# Patient Record
Sex: Female | Born: 1985 | Race: Black or African American | Hispanic: No | Marital: Married | Smoking: Never smoker
Health system: Southern US, Community
[De-identification: ages and names within clinical notes are randomized; demographics above are authoritative.]

## PROBLEM LIST (undated history)

## (undated) ENCOUNTER — Inpatient Hospital Stay (HOSPITAL_COMMUNITY): Payer: Self-pay

## (undated) DIAGNOSIS — F329 Major depressive disorder, single episode, unspecified: Secondary | ICD-10-CM

## (undated) DIAGNOSIS — K219 Gastro-esophageal reflux disease without esophagitis: Secondary | ICD-10-CM

## (undated) DIAGNOSIS — E785 Hyperlipidemia, unspecified: Secondary | ICD-10-CM

## (undated) DIAGNOSIS — F419 Anxiety disorder, unspecified: Secondary | ICD-10-CM

## (undated) DIAGNOSIS — M1990 Unspecified osteoarthritis, unspecified site: Secondary | ICD-10-CM

## (undated) DIAGNOSIS — F32A Depression, unspecified: Secondary | ICD-10-CM

## (undated) DIAGNOSIS — T7840XA Allergy, unspecified, initial encounter: Secondary | ICD-10-CM

## (undated) DIAGNOSIS — R519 Headache, unspecified: Secondary | ICD-10-CM

## (undated) DIAGNOSIS — F840 Autistic disorder: Secondary | ICD-10-CM

## (undated) HISTORY — PX: PERIPHERALLY INSERTED CENTRAL CATHETER INSERTION: SHX2221

## (undated) HISTORY — DX: Allergy, unspecified, initial encounter: T78.40XA

## (undated) HISTORY — DX: Unspecified osteoarthritis, unspecified site: M19.90

## (undated) HISTORY — DX: Hyperlipidemia, unspecified: E78.5

## (undated) HISTORY — DX: Anxiety disorder, unspecified: F41.9

## (undated) HISTORY — DX: Major depressive disorder, single episode, unspecified: F32.9

## (undated) HISTORY — DX: Depression, unspecified: F32.A

## (undated) HISTORY — DX: Headache, unspecified: R51.9

## (undated) HISTORY — PX: WISDOM TOOTH EXTRACTION: SHX21

## (undated) HISTORY — DX: Autistic disorder: F84.0

---

## 2004-08-18 ENCOUNTER — Emergency Department (HOSPITAL_COMMUNITY): Admission: EM | Admit: 2004-08-18 | Discharge: 2004-08-18 | Payer: Self-pay | Admitting: *Deleted

## 2005-08-06 ENCOUNTER — Emergency Department (HOSPITAL_COMMUNITY): Admission: EM | Admit: 2005-08-06 | Discharge: 2005-08-07 | Payer: Self-pay | Admitting: Emergency Medicine

## 2010-12-14 NOTE — L&D Delivery Note (Signed)
Delivery Note Upon arrival to L&D, pt pushed about 5 minutes with good effort. At 6:52 AM a healthy female was delivered via Vaginal, Spontaneous Delivery (Presentation: ; Occiput Anterior).  APGAR: 8, 9; weight 5 lb 10.5 oz (2565 g).   Placenta status: Intact, Spontaneous Pathology.  Cord: 3 vessels with the following complications: Cord was extremely short, had to be cut at perineum.  Moderate meconium stained fluid, but baby with vigorous cry--so NICU excused. Anesthesia: None  Episiotomy: None Lacerations: None Suture Repair: N/A Est. Blood Loss (mL): 200  Mom to postpartum.  Baby to nursery-stable.  Oliver Pila 10/08/2011, 7:29 AM

## 2011-03-05 ENCOUNTER — Inpatient Hospital Stay (HOSPITAL_COMMUNITY)
Admission: AD | Admit: 2011-03-05 | Discharge: 2011-03-05 | Disposition: A | Discharge status: Home / Self Care | Payer: BC Managed Care – PPO | Source: Ambulatory Visit | Attending: Obstetrics and Gynecology | Admitting: Obstetrics and Gynecology

## 2011-03-05 ENCOUNTER — Encounter (HOSPITAL_COMMUNITY): Payer: Self-pay | Admitting: Radiology

## 2011-03-05 ENCOUNTER — Inpatient Hospital Stay (HOSPITAL_COMMUNITY): Payer: BC Managed Care – PPO

## 2011-03-05 DIAGNOSIS — O210 Mild hyperemesis gravidarum: Secondary | ICD-10-CM | POA: Insufficient documentation

## 2011-03-30 ENCOUNTER — Inpatient Hospital Stay (HOSPITAL_COMMUNITY)
Admission: AD | Admit: 2011-03-30 | Discharge: 2011-03-30 | Disposition: A | Discharge status: Home / Self Care | Payer: BC Managed Care – PPO | Source: Ambulatory Visit | Attending: Obstetrics and Gynecology | Admitting: Obstetrics and Gynecology

## 2011-03-30 DIAGNOSIS — O210 Mild hyperemesis gravidarum: Secondary | ICD-10-CM | POA: Insufficient documentation

## 2011-04-09 ENCOUNTER — Inpatient Hospital Stay (HOSPITAL_COMMUNITY)
Admission: AD | Admit: 2011-04-09 | Discharge: 2011-04-10 | Disposition: A | Discharge status: Home / Self Care | Payer: BC Managed Care – PPO | Source: Ambulatory Visit | Attending: Obstetrics and Gynecology | Admitting: Obstetrics and Gynecology

## 2011-04-09 DIAGNOSIS — O210 Mild hyperemesis gravidarum: Secondary | ICD-10-CM

## 2011-04-09 LAB — CBC
HCT: 41.2 % (ref 36.0–46.0)
Hemoglobin: 14.6 g/dL (ref 12.0–15.0)
MCH: 33.2 pg (ref 26.0–34.0)
MCHC: 35.4 g/dL (ref 30.0–36.0)
Platelets: 274 10*3/uL (ref 150–400)
RDW: 12.9 % (ref 11.5–15.5)
WBC: 14.5 10*3/uL — ABNORMAL HIGH (ref 4.0–10.5)

## 2011-04-09 LAB — COMPREHENSIVE METABOLIC PANEL
BUN: 6 mg/dL (ref 6–23)
Creatinine, Ser: 0.6 mg/dL (ref 0.4–1.2)
GFR calc Af Amer: >60 mL/min (ref >60)
Glucose, Bld: 70 mg/dL (ref 70–99)
Potassium: 3.3 mEq/L — ABNORMAL LOW (ref 3.5–5.1)

## 2011-04-09 LAB — URINALYSIS, ROUTINE W REFLEX MICROSCOPIC
Leukocytes, UA: NEGATIVE
Nitrite: NEGATIVE
pH: 6 (ref 5.0–8.0)

## 2011-04-09 LAB — URINE MICROSCOPIC-ADD ON

## 2011-04-18 ENCOUNTER — Observation Stay (HOSPITAL_COMMUNITY)
Admission: AD | Admit: 2011-04-18 | Discharge: 2011-04-19 | Disposition: A | Discharge status: Home / Self Care | Payer: BC Managed Care – PPO | Source: Ambulatory Visit | Attending: Obstetrics and Gynecology | Admitting: Obstetrics and Gynecology

## 2011-04-18 DIAGNOSIS — O210 Mild hyperemesis gravidarum: Principal | ICD-10-CM | POA: Insufficient documentation

## 2011-04-18 LAB — CBC
Hemoglobin: 16.2 g/dL — ABNORMAL HIGH (ref 12.0–15.0)
MCH: 33.2 pg (ref 26.0–34.0)
MCHC: 36.7 g/dL — ABNORMAL HIGH (ref 30.0–36.0)
MCV: 90.4 fL (ref 78.0–100.0)
Platelets: 340 10*3/uL (ref 150–400)
RBC: 4.88 MIL/uL (ref 3.87–5.11)
WBC: 21 10*3/uL — ABNORMAL HIGH (ref 4.0–10.5)

## 2011-04-18 LAB — URINALYSIS, ROUTINE W REFLEX MICROSCOPIC
Nitrite: NEGATIVE
Protein, ur: 100 mg/dL — AB
Specific Gravity, Urine: >1.03 — ABNORMAL HIGH (ref 1.005–1.030)
Urobilinogen, UA: 2 mg/dL — ABNORMAL HIGH (ref 0.0–1.0)

## 2011-04-18 LAB — URINE MICROSCOPIC-ADD ON

## 2011-04-19 LAB — COMPREHENSIVE METABOLIC PANEL
AST: 18 U/L (ref 0–37)
BUN: 22 mg/dL (ref 6–23)
Chloride: 90 mEq/L — ABNORMAL LOW (ref 96–112)
Creatinine, Ser: 1.26 mg/dL — ABNORMAL HIGH (ref 0.4–1.2)
GFR calc Af Amer: >60 mL/min (ref >60)
Glucose, Bld: 168 mg/dL — ABNORMAL HIGH (ref 70–99)
Potassium: 2.3 mEq/L — CL (ref 3.5–5.1)
Sodium: 134 mEq/L — ABNORMAL LOW (ref 135–145)
Total Bilirubin: 1.3 mg/dL — ABNORMAL HIGH (ref 0.3–1.2)

## 2011-04-20 ENCOUNTER — Other Ambulatory Visit (HOSPITAL_COMMUNITY): Payer: Self-pay | Admitting: Obstetrics and Gynecology

## 2011-04-20 DIAGNOSIS — R1011 Right upper quadrant pain: Secondary | ICD-10-CM

## 2011-04-20 LAB — URINE CULTURE

## 2011-04-23 ENCOUNTER — Inpatient Hospital Stay (HOSPITAL_COMMUNITY)
Admission: AD | Admit: 2011-04-23 | Discharge: 2011-04-25 | DRG: 886 | Disposition: A | Discharge status: Home / Self Care | Payer: BC Managed Care – PPO | Source: Ambulatory Visit | Attending: Obstetrics and Gynecology | Admitting: Obstetrics and Gynecology

## 2011-04-23 ENCOUNTER — Ambulatory Visit (HOSPITAL_COMMUNITY)
Admission: RE | Admit: 2011-04-23 | Discharge: 2011-04-23 | Disposition: A | Discharge status: Home / Self Care | Payer: BC Managed Care – PPO | Source: Ambulatory Visit | Attending: Obstetrics and Gynecology | Admitting: Obstetrics and Gynecology

## 2011-04-23 DIAGNOSIS — N390 Urinary tract infection, site not specified: Secondary | ICD-10-CM | POA: Diagnosis present

## 2011-04-23 DIAGNOSIS — R5381 Other malaise: Secondary | ICD-10-CM | POA: Diagnosis present

## 2011-04-23 DIAGNOSIS — R1011 Right upper quadrant pain: Secondary | ICD-10-CM | POA: Insufficient documentation

## 2011-04-23 DIAGNOSIS — O99891 Other specified diseases and conditions complicating pregnancy: Secondary | ICD-10-CM | POA: Diagnosis present

## 2011-04-23 DIAGNOSIS — E876 Hypokalemia: Secondary | ICD-10-CM | POA: Diagnosis present

## 2011-04-23 DIAGNOSIS — R5383 Other fatigue: Secondary | ICD-10-CM | POA: Diagnosis present

## 2011-04-23 DIAGNOSIS — E860 Dehydration: Secondary | ICD-10-CM | POA: Diagnosis present

## 2011-04-23 DIAGNOSIS — O211 Hyperemesis gravidarum with metabolic disturbance: Principal | ICD-10-CM | POA: Diagnosis present

## 2011-04-23 DIAGNOSIS — R1013 Epigastric pain: Secondary | ICD-10-CM | POA: Diagnosis present

## 2011-04-23 DIAGNOSIS — O2390 Unspecified genitourinary tract infection in pregnancy, unspecified trimester: Secondary | ICD-10-CM | POA: Diagnosis present

## 2011-04-23 DIAGNOSIS — R7989 Other specified abnormal findings of blood chemistry: Secondary | ICD-10-CM | POA: Diagnosis present

## 2011-04-23 DIAGNOSIS — K838 Other specified diseases of biliary tract: Secondary | ICD-10-CM | POA: Insufficient documentation

## 2011-04-23 LAB — URINALYSIS, ROUTINE W REFLEX MICROSCOPIC
Glucose, UA: NEGATIVE mg/dL
Ketones, ur: >80 mg/dL — AB
Protein, ur: 100 mg/dL — AB
Urobilinogen, UA: 4 mg/dL — ABNORMAL HIGH (ref 0.0–1.0)

## 2011-04-23 LAB — URINE MICROSCOPIC-ADD ON

## 2011-04-23 LAB — CBC
HCT: 38.1 % (ref 36.0–46.0)
Hemoglobin: 13.6 g/dL (ref 12.0–15.0)
Platelets: 262 10*3/uL (ref 150–400)
RDW: 13 % (ref 11.5–15.5)
WBC: 13.2 10*3/uL — ABNORMAL HIGH (ref 4.0–10.5)

## 2011-04-23 LAB — COMPREHENSIVE METABOLIC PANEL
Albumin: 3.4 g/dL — ABNORMAL LOW (ref 3.5–5.2)
CO2: 22 mEq/L (ref 19–32)
Chloride: 97 mEq/L (ref 96–112)
Creatinine, Ser: 0.55 mg/dL (ref 0.4–1.2)
GFR calc Af Amer: >60 mL/min (ref >60)
Glucose, Bld: 85 mg/dL (ref 70–99)
Sodium: 135 mEq/L (ref 135–145)
Total Protein: 7 g/dL (ref 6.0–8.3)

## 2011-04-24 LAB — COMPREHENSIVE METABOLIC PANEL
AST: 72 U/L — ABNORMAL HIGH (ref 0–37)
Alkaline Phosphatase: 56 U/L (ref 39–117)
BUN: 3 mg/dL — ABNORMAL LOW (ref 6–23)
BUN: 3 mg/dL — ABNORMAL LOW (ref 6–23)
CO2: 22 mEq/L (ref 19–32)
Calcium: 8.8 mg/dL (ref 8.4–10.5)
Chloride: 98 mEq/L (ref 96–112)
Creatinine, Ser: 0.47 mg/dL (ref 0.4–1.2)
Creatinine, Ser: <0.47 mg/dL (ref 0.4–1.2)
Glucose, Bld: 76 mg/dL (ref 70–99)
Glucose, Bld: 114 mg/dL — ABNORMAL HIGH (ref 70–99)
Potassium: 2.6 mEq/L — CL (ref 3.5–5.1)
Potassium: 3.4 mEq/L — ABNORMAL LOW (ref 3.5–5.1)
Sodium: 130 mEq/L — ABNORMAL LOW (ref 135–145)
Total Bilirubin: 0.8 mg/dL (ref 0.3–1.2)
Total Bilirubin: 0.6 mg/dL (ref 0.3–1.2)
Total Protein: 5.5 g/dL — ABNORMAL LOW (ref 6.0–8.3)

## 2011-04-24 LAB — LIPASE, BLOOD: Lipase: 92 U/L — ABNORMAL HIGH (ref 11–59)

## 2011-04-24 LAB — AMYLASE: Amylase: 90 U/L (ref 0–105)

## 2011-04-25 LAB — URINE CULTURE
Colony Count: 25000
Culture  Setup Time: 201205110108

## 2011-04-27 LAB — COMPREHENSIVE METABOLIC PANEL
AST: 53 U/L — ABNORMAL HIGH (ref 0–37)
Albumin: 2.3 g/dL — ABNORMAL LOW (ref 3.5–5.2)
BUN: <3 mg/dL — ABNORMAL LOW (ref 6–23)
Calcium: 8.9 mg/dL (ref 8.4–10.5)
Creatinine, Ser: <0.47 mg/dL (ref 0.4–1.2)
Glucose, Bld: 75 mg/dL (ref 70–99)
Potassium: 3.7 mEq/L (ref 3.5–5.1)
Total Protein: 5.1 g/dL — ABNORMAL LOW (ref 6.0–8.3)

## 2011-04-27 NOTE — Discharge Summary (Signed)
  NAMECAPUCINE, [name]             ACCOUNT NO.:  [id number]  MEDICAL RECORD NO.:  [id number]           PATIENT TYPE:  LOCATION:                                 FACILITY:  PHYSICIAN:  Huel Cote, M.D. DATE OF BIRTH:  05-09-1986  DATE OF ADMISSION:  04/23/2011 DATE OF DISCHARGE:  04/25/2011                              DISCHARGE SUMMARY   DISCHARGE DIAGNOSES: 1. A 13+ weeks' gestation with persistent nausea and vomiting. 2. Hypokalemia. 3. Slightly elevated LFTs with gallbladder sludge noted on ultrasound.  DISCHARGE MEDICATIONS: 1. Zofran 8 mg ODT p.o. b.i.d. as needed. 2. Potassium as previously prescribed by Dr. Ambrose Mantle.  DISCHARGE FOLLOWUP:  The patient is to follow up in the office on Wednesday, Apr 29, 2011.  HOSPITAL COURSE:  The patient is a 25 year old G1, P0 who came in with a complaint of significant weakness and dehydration from persistent nausea and vomiting.  She had an abdominal ultrasound performed due to some epigastric and right upper quadrant pain which revealed no gallbladder stones.  She did have some mild sludge noted, but was also quite dehydrated and had not been able to keep any food down.  She had a urine which showed several white blood cells and trace leukocyte esterase which was suspicious for a UTI and was, therefore, placed on Ancef as she could not tolerate p.o. medications.  She received hydration and replacement of her potassium IV and approximately 24 hours after her admission, began to feel some better and was able to tolerate a meal without emesis.  On the day of discharge, she had tolerated her breakfast with no emesis and her potassium had improved to 3.7 from an admission potassium of 2.26.  She also had still slightly elevated liver enzymes, but these had improved to 53 and 105 from 69 and 130.  Overall, she was feeling well.  She had some Zofran ODT and Phenergan at home to help control any recurrent nausea and vomiting she might  have and Dr. Ambrose Mantle had also prescribed some potassium tablets which she will take up and begin to take daily.  She will be seen in the office on Wednesday, Apr 29, 2011, to confirm that she is doing well and possibly recheck her potassium if she has any further emesis.     Huel Cote, M.D.     KR/MEDQ  D:  04/25/2011  T:  04/25/2011  Job:  [id number]  Electronically Signed by Huel Cote M.D. on 04/27/2011 08:51:15 AM

## 2011-05-07 ENCOUNTER — Inpatient Hospital Stay (HOSPITAL_COMMUNITY)
Admission: AD | Admit: 2011-05-07 | Discharge: 2011-05-07 | Disposition: A | Discharge status: Home / Self Care | Payer: BC Managed Care – PPO | Source: Ambulatory Visit | Attending: Obstetrics and Gynecology | Admitting: Obstetrics and Gynecology

## 2011-05-07 DIAGNOSIS — O210 Mild hyperemesis gravidarum: Secondary | ICD-10-CM

## 2011-05-07 LAB — COMPREHENSIVE METABOLIC PANEL
ALT: 35 U/L (ref 0–35)
Albumin: 3.6 g/dL (ref 3.5–5.2)
Alkaline Phosphatase: 66 U/L (ref 39–117)
BUN: 6 mg/dL (ref 6–23)
Chloride: 98 mEq/L (ref 96–112)
Potassium: 3.1 mEq/L — ABNORMAL LOW (ref 3.5–5.1)
Sodium: 135 mEq/L (ref 135–145)
Total Bilirubin: 0.8 mg/dL (ref 0.3–1.2)
Total Protein: 7.3 g/dL (ref 6.0–8.3)

## 2011-05-07 LAB — CBC
MCHC: 35 g/dL (ref 30.0–36.0)
Platelets: 314 10*3/uL (ref 150–400)
RDW: 13.3 % (ref 11.5–15.5)
WBC: 15.8 10*3/uL — ABNORMAL HIGH (ref 4.0–10.5)

## 2011-05-07 LAB — URINALYSIS, ROUTINE W REFLEX MICROSCOPIC
Nitrite: NEGATIVE
Protein, ur: 100 mg/dL — AB
Specific Gravity, Urine: >1.03 — ABNORMAL HIGH (ref 1.005–1.030)
Urobilinogen, UA: 1 mg/dL (ref 0.0–1.0)

## 2011-05-07 LAB — URINE MICROSCOPIC-ADD ON

## 2011-05-08 LAB — URINE CULTURE: Colony Count: NO GROWTH

## 2011-05-09 ENCOUNTER — Inpatient Hospital Stay (HOSPITAL_COMMUNITY)
Admission: AD | Admit: 2011-05-09 | Discharge: 2011-05-11 | DRG: 886 | Disposition: A | Discharge status: Home / Self Care | Payer: BC Managed Care – PPO | Source: Ambulatory Visit | Attending: Obstetrics and Gynecology | Admitting: Obstetrics and Gynecology

## 2011-05-09 DIAGNOSIS — O210 Mild hyperemesis gravidarum: Secondary | ICD-10-CM

## 2011-05-09 LAB — DIFFERENTIAL
Basophils Absolute: 0 10*3/uL (ref 0.0–0.1)
Lymphocytes Relative: 17 % (ref 12–46)
Lymphs Abs: 2.4 10*3/uL (ref 0.7–4.0)
Monocytes Absolute: 0.9 10*3/uL (ref 0.1–1.0)
Neutro Abs: 11.2 10*3/uL — ABNORMAL HIGH (ref 1.7–7.7)

## 2011-05-09 LAB — CBC
MCH: 32.6 pg (ref 26.0–34.0)
MCHC: 34.5 g/dL (ref 30.0–36.0)
MCV: 94.3 fL (ref 78.0–100.0)
Platelets: 287 10*3/uL (ref 150–400)
RBC: 3.87 MIL/uL (ref 3.87–5.11)
RDW: 13.6 % (ref 11.5–15.5)

## 2011-05-09 LAB — URINALYSIS, ROUTINE W REFLEX MICROSCOPIC
Leukocytes, UA: NEGATIVE
Nitrite: NEGATIVE
Specific Gravity, Urine: >1.03 — ABNORMAL HIGH (ref 1.005–1.030)
Urobilinogen, UA: 1 mg/dL (ref 0.0–1.0)
pH: 6 (ref 5.0–8.0)

## 2011-05-09 LAB — URINE MICROSCOPIC-ADD ON

## 2011-05-09 LAB — COMPREHENSIVE METABOLIC PANEL
Albumin: 3.4 g/dL — ABNORMAL LOW (ref 3.5–5.2)
BUN: 4 mg/dL — ABNORMAL LOW (ref 6–23)
Calcium: 9.8 mg/dL (ref 8.4–10.5)
Chloride: 103 mEq/L (ref 96–112)
Creatinine, Ser: 0.49 mg/dL (ref 0.4–1.2)
GFR calc Af Amer: >60 mL/min (ref >60)
Total Bilirubin: 0.5 mg/dL (ref 0.3–1.2)

## 2011-05-10 LAB — CBC
HCT: 32.5 % — ABNORMAL LOW (ref 36.0–46.0)
Hemoglobin: 11 g/dL — ABNORMAL LOW (ref 12.0–15.0)
MCHC: 33.8 g/dL (ref 30.0–36.0)
MCV: 94.8 fL (ref 78.0–100.0)
WBC: 11.4 10*3/uL — ABNORMAL HIGH (ref 4.0–10.5)

## 2011-05-10 LAB — BASIC METABOLIC PANEL
CO2: 19 mEq/L (ref 19–32)
Glucose, Bld: 67 mg/dL — ABNORMAL LOW (ref 70–99)
Potassium: 3.5 mEq/L (ref 3.5–5.1)
Sodium: 137 mEq/L (ref 135–145)

## 2011-05-11 LAB — URINE CULTURE
Colony Count: NO GROWTH
Culture  Setup Time: 201205271626
Culture: NO GROWTH

## 2011-05-14 ENCOUNTER — Inpatient Hospital Stay (HOSPITAL_COMMUNITY)
Admission: AD | Admit: 2011-05-14 | Discharge: 2011-05-14 | Disposition: A | Discharge status: Home / Self Care | Payer: BC Managed Care – PPO | Source: Ambulatory Visit | Attending: Obstetrics and Gynecology | Admitting: Obstetrics and Gynecology

## 2011-05-14 DIAGNOSIS — O210 Mild hyperemesis gravidarum: Secondary | ICD-10-CM | POA: Insufficient documentation

## 2011-05-14 LAB — URINALYSIS, ROUTINE W REFLEX MICROSCOPIC
Ketones, ur: >80 mg/dL — AB
Nitrite: NEGATIVE
Protein, ur: NEGATIVE mg/dL
Urobilinogen, UA: 1 mg/dL (ref 0.0–1.0)

## 2011-05-18 ENCOUNTER — Inpatient Hospital Stay (HOSPITAL_COMMUNITY)
Admission: AD | Admit: 2011-05-18 | Discharge: 2011-05-18 | Disposition: A | Discharge status: Home / Self Care | Payer: BC Managed Care – PPO | Source: Ambulatory Visit | Attending: Obstetrics and Gynecology | Admitting: Obstetrics and Gynecology

## 2011-05-18 DIAGNOSIS — E860 Dehydration: Secondary | ICD-10-CM

## 2011-05-18 DIAGNOSIS — O211 Hyperemesis gravidarum with metabolic disturbance: Secondary | ICD-10-CM

## 2011-05-18 LAB — CBC
HCT: 35.3 % — ABNORMAL LOW (ref 36.0–46.0)
MCHC: 35.1 g/dL (ref 30.0–36.0)
MCV: 95.9 fL (ref 78.0–100.0)
Platelets: 282 10*3/uL (ref 150–400)
RDW: 13.9 % (ref 11.5–15.5)
WBC: 13.4 10*3/uL — ABNORMAL HIGH (ref 4.0–10.5)

## 2011-05-18 LAB — URINE MICROSCOPIC-ADD ON

## 2011-05-18 LAB — COMPREHENSIVE METABOLIC PANEL
Albumin: 3.2 g/dL — ABNORMAL LOW (ref 3.5–5.2)
BUN: 5 mg/dL — ABNORMAL LOW (ref 6–23)
Calcium: 9.7 mg/dL (ref 8.4–10.5)
Glucose, Bld: 77 mg/dL (ref 70–99)
Potassium: 3.5 mEq/L (ref 3.5–5.1)
Sodium: 136 mEq/L (ref 135–145)
Total Protein: 6.8 g/dL (ref 6.0–8.3)

## 2011-05-18 LAB — URINALYSIS, ROUTINE W REFLEX MICROSCOPIC
Glucose, UA: NEGATIVE mg/dL
Hgb urine dipstick: NEGATIVE
Leukocytes, UA: NEGATIVE
Protein, ur: 30 mg/dL — AB
pH: 5.5 (ref 5.0–8.0)

## 2011-05-22 ENCOUNTER — Inpatient Hospital Stay (HOSPITAL_COMMUNITY)
Admission: EM | Admit: 2011-05-22 | Discharge: 2011-05-22 | Disposition: A | Discharge status: Home / Self Care | Payer: BC Managed Care – PPO | Source: Ambulatory Visit | Attending: Obstetrics and Gynecology | Admitting: Obstetrics and Gynecology

## 2011-05-22 DIAGNOSIS — O210 Mild hyperemesis gravidarum: Secondary | ICD-10-CM | POA: Insufficient documentation

## 2011-05-22 LAB — BASIC METABOLIC PANEL
CO2: 19 mEq/L (ref 19–32)
Calcium: 9.3 mg/dL (ref 8.4–10.5)
Chloride: 106 mEq/L (ref 96–112)
Glucose, Bld: 68 mg/dL — ABNORMAL LOW (ref 70–99)
Potassium: 3.9 mEq/L (ref 3.5–5.1)
Sodium: 137 mEq/L (ref 135–145)

## 2011-05-22 LAB — URINALYSIS, ROUTINE W REFLEX MICROSCOPIC
Glucose, UA: NEGATIVE mg/dL
Hgb urine dipstick: NEGATIVE
Leukocytes, UA: NEGATIVE
pH: 6.5 (ref 5.0–8.0)

## 2011-05-26 ENCOUNTER — Inpatient Hospital Stay (HOSPITAL_COMMUNITY)
Admission: AD | Admit: 2011-05-26 | Discharge: 2011-05-26 | Disposition: A | Discharge status: Home / Self Care | Payer: BC Managed Care – PPO | Source: Ambulatory Visit | Attending: Obstetrics and Gynecology | Admitting: Obstetrics and Gynecology

## 2011-05-26 DIAGNOSIS — O210 Mild hyperemesis gravidarum: Secondary | ICD-10-CM | POA: Insufficient documentation

## 2011-05-26 LAB — URINALYSIS, ROUTINE W REFLEX MICROSCOPIC
Bilirubin Urine: NEGATIVE
Leukocytes, UA: NEGATIVE
Nitrite: NEGATIVE
Specific Gravity, Urine: 1.025 (ref 1.005–1.030)
pH: 6 (ref 5.0–8.0)

## 2011-05-29 ENCOUNTER — Inpatient Hospital Stay (HOSPITAL_COMMUNITY)
Admission: EM | Admit: 2011-05-29 | Discharge: 2011-05-29 | Disposition: A | Discharge status: Home / Self Care | Payer: BC Managed Care – PPO | Source: Ambulatory Visit | Attending: Obstetrics and Gynecology | Admitting: Obstetrics and Gynecology

## 2011-05-29 DIAGNOSIS — E785 Hyperlipidemia, unspecified: Secondary | ICD-10-CM

## 2011-05-29 DIAGNOSIS — O99891 Other specified diseases and conditions complicating pregnancy: Secondary | ICD-10-CM

## 2011-05-29 DIAGNOSIS — K219 Gastro-esophageal reflux disease without esophagitis: Secondary | ICD-10-CM

## 2011-05-29 DIAGNOSIS — Y92009 Unspecified place in unspecified non-institutional (private) residence as the place of occurrence of the external cause: Secondary | ICD-10-CM | POA: Insufficient documentation

## 2011-05-29 DIAGNOSIS — Y849 Medical procedure, unspecified as the cause of abnormal reaction of the patient, or of later complication, without mention of misadventure at the time of the procedure: Secondary | ICD-10-CM | POA: Insufficient documentation

## 2011-05-29 DIAGNOSIS — O9989 Other specified diseases and conditions complicating pregnancy, childbirth and the puerperium: Secondary | ICD-10-CM

## 2011-06-02 ENCOUNTER — Inpatient Hospital Stay (HOSPITAL_COMMUNITY)
Admission: AD | Admit: 2011-06-02 | Discharge: 2011-06-02 | Disposition: A | Discharge status: Home / Self Care | Payer: BC Managed Care – PPO | Source: Ambulatory Visit | Attending: Obstetrics and Gynecology | Admitting: Obstetrics and Gynecology

## 2011-06-02 DIAGNOSIS — O210 Mild hyperemesis gravidarum: Secondary | ICD-10-CM | POA: Insufficient documentation

## 2011-06-02 DIAGNOSIS — E785 Hyperlipidemia, unspecified: Secondary | ICD-10-CM | POA: Insufficient documentation

## 2011-06-02 DIAGNOSIS — K219 Gastro-esophageal reflux disease without esophagitis: Secondary | ICD-10-CM | POA: Insufficient documentation

## 2011-06-02 DIAGNOSIS — O99891 Other specified diseases and conditions complicating pregnancy: Secondary | ICD-10-CM | POA: Insufficient documentation

## 2011-06-06 ENCOUNTER — Inpatient Hospital Stay (HOSPITAL_COMMUNITY)
Admission: AD | Admit: 2011-06-06 | Discharge: 2011-06-06 | Disposition: A | Discharge status: Home / Self Care | Payer: BC Managed Care – PPO | Source: Ambulatory Visit | Attending: Obstetrics and Gynecology | Admitting: Obstetrics and Gynecology

## 2011-06-06 DIAGNOSIS — O99891 Other specified diseases and conditions complicating pregnancy: Secondary | ICD-10-CM

## 2011-06-06 DIAGNOSIS — O210 Mild hyperemesis gravidarum: Secondary | ICD-10-CM | POA: Insufficient documentation

## 2011-06-06 DIAGNOSIS — K219 Gastro-esophageal reflux disease without esophagitis: Secondary | ICD-10-CM

## 2011-06-06 DIAGNOSIS — O9989 Other specified diseases and conditions complicating pregnancy, childbirth and the puerperium: Secondary | ICD-10-CM

## 2011-06-06 DIAGNOSIS — E785 Hyperlipidemia, unspecified: Secondary | ICD-10-CM | POA: Insufficient documentation

## 2011-06-06 DIAGNOSIS — Z79899 Other long term (current) drug therapy: Secondary | ICD-10-CM | POA: Insufficient documentation

## 2011-06-07 ENCOUNTER — Inpatient Hospital Stay (HOSPITAL_COMMUNITY)
Admission: AD | Admit: 2011-06-07 | Discharge: 2011-06-07 | Disposition: A | Discharge status: Home / Self Care | Payer: BC Managed Care – PPO | Source: Ambulatory Visit | Attending: Obstetrics and Gynecology | Admitting: Obstetrics and Gynecology

## 2011-06-07 DIAGNOSIS — O210 Mild hyperemesis gravidarum: Secondary | ICD-10-CM

## 2011-06-10 ENCOUNTER — Inpatient Hospital Stay (HOSPITAL_COMMUNITY)
Admission: AD | Admit: 2011-06-10 | Discharge: 2011-06-10 | Disposition: A | Discharge status: Home / Self Care | Payer: Self-pay | Source: Ambulatory Visit | Attending: Obstetrics and Gynecology | Admitting: Obstetrics and Gynecology

## 2011-06-10 DIAGNOSIS — O210 Mild hyperemesis gravidarum: Secondary | ICD-10-CM

## 2011-06-11 ENCOUNTER — Inpatient Hospital Stay (HOSPITAL_COMMUNITY)
Admission: AD | Admit: 2011-06-11 | Discharge: 2011-06-11 | Disposition: A | Discharge status: Home / Self Care | Payer: BC Managed Care – PPO | Source: Ambulatory Visit | Attending: Obstetrics and Gynecology | Admitting: Obstetrics and Gynecology

## 2011-06-11 DIAGNOSIS — O210 Mild hyperemesis gravidarum: Secondary | ICD-10-CM | POA: Insufficient documentation

## 2011-06-12 ENCOUNTER — Other Ambulatory Visit (HOSPITAL_COMMUNITY): Payer: Self-pay | Admitting: Obstetrics and Gynecology

## 2011-06-12 ENCOUNTER — Inpatient Hospital Stay (HOSPITAL_COMMUNITY)
Admission: AD | Admit: 2011-06-12 | Discharge: 2011-06-12 | Disposition: A | Discharge status: Home / Self Care | Payer: BC Managed Care – PPO | Source: Ambulatory Visit | Attending: Obstetrics and Gynecology | Admitting: Obstetrics and Gynecology

## 2011-06-12 ENCOUNTER — Ambulatory Visit (HOSPITAL_COMMUNITY)
Admit: 2011-06-12 | Discharge: 2011-06-12 | Disposition: A | Discharge status: Home / Self Care | Payer: BC Managed Care – PPO | Source: Ambulatory Visit | Attending: Obstetrics and Gynecology | Admitting: Obstetrics and Gynecology

## 2011-06-12 DIAGNOSIS — O210 Mild hyperemesis gravidarum: Secondary | ICD-10-CM | POA: Insufficient documentation

## 2011-06-12 DIAGNOSIS — R1110 Vomiting, unspecified: Secondary | ICD-10-CM

## 2011-06-14 NOTE — Discharge Summary (Signed)
  [name], [name]             ACCOUNT NO.:  [id number]  MEDICAL RECORD NO.:  [id number]  LOCATION:  9304                          [facility]ILITY[facility]  [facility]  PHYSICIAN:  Zenaida Niece, M.D.DATE OF BIRTH:  1986/06/30  DATE OF ADMISSION:  05/09/2011 DATE OF DISCHARGE:  05/11/2011                              DISCHARGE SUMMARY   ADMISSION DIAGNOSIS:  Intrauterine pregnancy at 15 weeks with hyper emesis gravidarum.  DISCHARGE DIAGNOSIS:  Intrauterine pregnancy at 15 weeks with hyper emesis gravidarum.  PROCEDURES:  IV fluids and antiemetics.  HISTORY AND PHYSICAL:  This is a 24 year old gravida 1, para 0 with an EGA of 15 plus weeks who is admitted by Dr. Ellyn Hack on May 09, 2011, for nausea, vomiting in pregnancy unresponsive to antiemetics and IV hydration, in maternal admission as well as hypokalemia.  The reports says she has lost about 30 pounds with pregnancy and has had intermittent elevated liver enzymes as well.  She has had a history of pyelonephritis and was having back pain.  PAST MEDICAL HISTORY:  Depression, gastroesophageal reflux disease, hyperlipidemia, and pyelonephritis.  PAST SURGICAL HISTORY:  Wisdom tooth extraction.  MEDICATIONS:  Phenergan and Zofran.  PHYSICAL EXAMINATION:  GENERAL:  She was afebrile with stable vital signs, positive fetal heart tones in the 160s. ABDOMEN:  Soft, normal bowel sounds.  No rebound, nontender fundus questionable CVA tenderness.  ADMISSION LABS:  Revealed potassium at 3.0, normal liver enzymes. Urinalysis with many bacteria increased specific gravity and ketones  HOSPITAL COURSE:  The patient was admitted by Dr. Ellyn Hack for hyperemesis.  She was put on IV fluids with Zofran, Reglan, and Protonix.  On May 10, 2011, she was feeling better and potassium was up to 3.5.  She was continued on IV fluids and tried full liquid diet.  On the morning of May 11, 2011, she was not having any emesis and was tolerating full liquids and  felt better.  Abdomen was soft and nontender.  During the day on May 11, 2011, IV was saline locked and she was advanced to regular diet.  She did tolerate this with p.o. antiemetics and on the evening of May 11, 2011, was felt to be stable enough for discharge home.  DISCHARGE INSTRUCTIONS:  Diet as tolerated.  She is to continue her antiemetics and she is to follow up later on the week to make sure she is stable.  Her medications are Zofran ODT 8 mg p.o. q.8 hours, Phenergan 25 mg p.o. q.6 h p.r.n., and Nexium 40 mg p.o. daily.     Zenaida Niece, M.D.     TDM/MEDQ  D:  06/05/2011  T:  06/05/2011  Job:  [id number]  Electronically Signed by Lavina Hamman M.D. on 06/14/2011 09:17:54 AM

## 2011-08-05 ENCOUNTER — Inpatient Hospital Stay (HOSPITAL_COMMUNITY)
Admission: AD | Admit: 2011-08-05 | Discharge: 2011-08-07 | DRG: 886 | Disposition: A | Discharge status: Home / Self Care | Payer: BC Managed Care – PPO | Source: Ambulatory Visit | Attending: Obstetrics and Gynecology | Admitting: Obstetrics and Gynecology

## 2011-08-05 ENCOUNTER — Encounter (HOSPITAL_COMMUNITY): Payer: Self-pay

## 2011-08-05 DIAGNOSIS — O2650 Maternal hypotension syndrome, unspecified trimester: Principal | ICD-10-CM | POA: Diagnosis present

## 2011-08-05 DIAGNOSIS — E876 Hypokalemia: Secondary | ICD-10-CM | POA: Diagnosis present

## 2011-08-05 DIAGNOSIS — O212 Late vomiting of pregnancy: Secondary | ICD-10-CM | POA: Diagnosis present

## 2011-08-05 HISTORY — DX: Gastro-esophageal reflux disease without esophagitis: K21.9

## 2011-08-05 LAB — COMPREHENSIVE METABOLIC PANEL
ALT: 11 U/L (ref 0–35)
Alkaline Phosphatase: 76 U/L (ref 39–117)
CO2: 19 mEq/L (ref 19–32)
Chloride: 99 mEq/L (ref 96–112)
GFR calc Af Amer: >60 mL/min (ref >60)
GFR calc non Af Amer: >60 mL/min (ref >60)
Glucose, Bld: 104 mg/dL — ABNORMAL HIGH (ref 70–99)
Potassium: 2.7 mEq/L — CL (ref 3.5–5.1)
Sodium: 132 mEq/L — ABNORMAL LOW (ref 135–145)

## 2011-08-05 LAB — ANTIBODY SCREEN: Antibody Screen: NEGATIVE

## 2011-08-05 LAB — ABO/RH

## 2011-08-05 LAB — CBC
Hemoglobin: 12.2 g/dL (ref 12.0–15.0)
RBC: 3.62 MIL/uL — ABNORMAL LOW (ref 3.87–5.11)

## 2011-08-05 LAB — HIV ANTIBODY (ROUTINE TESTING W REFLEX): HIV: NONREACTIVE

## 2011-08-05 LAB — RUBELLA ANTIBODY, IGM: Rubella: IMMUNE

## 2011-08-05 MED ORDER — TERBUTALINE SULFATE 1 MG/ML IJ SOLN
0.2500 mg | Freq: Once | INTRAMUSCULAR | Status: AC | PRN
  Administered 2011-08-05: 0.25 mg via SUBCUTANEOUS
  Filled 2011-08-05: qty 1

## 2011-08-05 MED ORDER — TERBUTALINE SULFATE 1 MG/ML IJ SOLN
0.2500 mg | Freq: Once | INTRAMUSCULAR | Status: AC
  Administered 2011-08-05: 0.25 mg via SUBCUTANEOUS

## 2011-08-05 MED ORDER — ZOLPIDEM TARTRATE 10 MG PO TABS: 10.0000 mg | ORAL_TABLET | Freq: Every evening | ORAL | Status: DC | PRN

## 2011-08-05 MED ORDER — SODIUM CHLORIDE 0.9 % IV SOLN: INTRAVENOUS | Status: DC

## 2011-08-05 MED ORDER — CALCIUM CARBONATE ANTACID 500 MG PO CHEW: 2.0000 | CHEWABLE_TABLET | ORAL | Status: DC | PRN

## 2011-08-05 MED ORDER — PRENATAL PLUS 27-1 MG PO TABS
1.0000 | ORAL_TABLET | Freq: Every day | ORAL | Status: DC
  Administered 2011-08-06: 1 via ORAL
  Filled 2011-08-05 (×3): qty 1

## 2011-08-05 MED ORDER — POTASSIUM CHLORIDE 2 MEQ/ML IV SOLN
INTRAVENOUS | Status: DC
  Administered 2011-08-06: 05:00:00 via INTRAVENOUS
  Filled 2011-08-05 (×4): qty 1000

## 2011-08-05 MED ORDER — SODIUM CHLORIDE 0.9 % IJ SOLN
INTRAMUSCULAR | Status: AC
  Filled 2011-08-05: qty 6

## 2011-08-05 MED ORDER — PROMETHAZINE HCL 25 MG/ML IJ SOLN
25.0000 mg | Freq: Once | INTRAMUSCULAR | Status: AC
  Administered 2011-08-05: 25 mg via INTRAMUSCULAR
  Filled 2011-08-05: qty 1

## 2011-08-05 MED ORDER — KCL-LACTATED RINGERS 20 MEQ/L IV SOLN: INTRAVENOUS | Status: DC

## 2011-08-05 MED ORDER — PROMETHAZINE HCL 25 MG PO TABS
25.0000 mg | ORAL_TABLET | Freq: Once | ORAL | Status: AC
  Administered 2011-08-05: 25 mg via ORAL
  Filled 2011-08-05: qty 1

## 2011-08-05 MED ORDER — POTASSIUM CHLORIDE 2 MEQ/ML IV SOLN
INTRAVENOUS | Status: DC
  Administered 2011-08-05: 20:00:00 via INTRAVENOUS
  Filled 2011-08-05 (×7): qty 1000

## 2011-08-05 MED ORDER — TERBUTALINE SULFATE 1 MG/ML IJ SOLN
INTRAMUSCULAR | Status: AC
  Administered 2011-08-05: 0.25 mg via SUBCUTANEOUS
  Filled 2011-08-05: qty 1

## 2011-08-05 MED ORDER — DOCUSATE SODIUM 100 MG PO CAPS
100.0000 mg | ORAL_CAPSULE | Freq: Every day | ORAL | Status: DC
  Administered 2011-08-06: 100 mg via ORAL
  Filled 2011-08-05: qty 1

## 2011-08-05 MED ORDER — LACTATED RINGERS IV SOLN: INTRAVENOUS | Status: DC

## 2011-08-05 NOTE — Progress Notes (Signed)
Pt sent over from office for possible preterm contractions.  Pt reports abdominal pain and low back pain that radiates higher since this morning.  States the contractions come about every 2 minutes.  Denies any bleeding or leaking of fluid.  + FM.  Was checked at office, cervix closed/long/high.

## 2011-08-05 NOTE — ED Provider Notes (Signed)
History     Chief Complaint  Patient presents with  . Back Pain   HPI    [name] 25 y.o. patient of Dr. Ebony Hail is now [redacted] weeks pregnant.  Was seen this afternoon in the office and sent here for monitoring/rule out contractions.  She states he checked her cervix in the office and she was long and closed.  She is feeling contractions, stating they are coming often.  Denies vaginal bleeding or leaking of fluid.  She is getting Phenergan nightly for nausea.    OB History    Grav Para Term Preterm Abortions TAB SAB Ect Mult Living   1               Past Medical History  Diagnosis Date  . Hyperemesis   . Acid reflux     Past Surgical History  Procedure Date  . Wisdom tooth extraction   . Peripherally inserted central catheter insertion     No family history on file.  History  Substance Use Topics  . Smoking status: Never Smoker   . Smokeless tobacco: Not on file  . Alcohol Use: No    Allergies: Not on File  No prescriptions prior to admission    Review of Systems  Constitutional: Negative for fever.  Cardiovascular: Chest pain: with zofran, which they have discontinued this medication.  Gastrointestinal: Positive for nausea, vomiting and abdominal pain (contractions).  Genitourinary: Positive for frequency.   Physical Exam   Blood pressure 118/74, pulse 94, temperature 98.3 F (36.8 C), temperature source Oral, resp. rate 20, height 5\' 4"  (1.626 m), weight 203 lb 12.8 oz (92.443 kg).  Physical Exam  Constitutional: She is oriented to person, place, and time. She appears well-developed and well-nourished.  Respiratory: Breath sounds normal.  Genitourinary:       FMS-contracting q 2-3.5 minutes  Neurological: She is alert and oriented to person, place, and time.  Skin: Skin is warm and dry.    MAU Course  Procedures  MDM Garry Heater, RN called Dr. Ambrose Mantle to give report..  He has given orders for Phenergan and Terbutaline.   At 17:30 pm I was  called by Tresa Endo, RN to come into patient's room.  She had labs drawn and had become unresponsive.  Rapid Response Team was called.  Was responsive with sternal pressure.  O2 was begun.  I called Dr.Henley to give report and he came within a few minutes.    Tresa Endo, RN reported that Dr. Ambrose Mantle plans to consult with Neurology.    Assessment and Plan  Dr. Ambrose Mantle is here and assumes care of patient's care.  KEY,EVE M 08/05/2011, 3:40 PM

## 2011-08-05 NOTE — ED Notes (Signed)
Pt. Taken down to Antenatal via wheelchair to room 52

## 2011-08-05 NOTE — ED Notes (Signed)
Neurologist in to see pt  

## 2011-08-05 NOTE — Progress Notes (Signed)
PIcc line inserted prior to admission insertion date per pt., length cm unknown

## 2011-08-05 NOTE — Progress Notes (Signed)
Pt states she has been having mid back pain and upper abdominal pain that started last night. Pt denies leaking fluid, bleeding  And reports good fetal movement.

## 2011-08-05 NOTE — ED Notes (Signed)
Pt unresponsive post lab draw at 1730-, rapid response called, E. Key, NP and T. Burleson, NP at bedside with pt, pt reactive to sternal rub, Rapid Response in to bedside at 1737.  Blood pressure taken immediately afterward 1732 : 141/86, retaken at 131/Pt awake, but very drowsy and unable to answer questions.  LR bolus started via picc line at 1740.  Dr. Ambrose Mantle at bedside at 1745.

## 2011-08-05 NOTE — H&P (Signed)
[name], [name]             ACCOUNT NO.:  [id number]  MEDICAL RECORD NO.:  [id number]  LOCATION:  WH10                          FACILITY:  [facility]  PHYSICIAN:  Malachi Pro. Ambrose Mantle, M.D. DATE OF BIRTH:  12/14/1986  DATE OF ADMISSION:  08/05/2011 DATE OF DISCHARGE:                             HISTORY & PHYSICAL   PRESENT ILLNESS:  This is a 25 year old black female para 0, gravida 1 with EDC on October 27, 2011, admitted with an episode of syncope in the maternity admission unit.  This patient's pregnancy has been extremely complicated.  She had an ultrasound on March 25, 2011, that showed a 9-week 1-day fetus with an [facility] of October 24, 2011.  Since her initial visit, she has complained of incessant vomiting and has been treated with Phenergan and Zofran, although she claims now that the Zofran might have caused chest pain.  She has had innumerable complaints not only related to nausea and vomiting of pregnancy, but she has had such incessant nausea and vomiting that she requested and received a PICC line at approximately 20 weeks' gestation.  She had her IV site changed at least 15 times.  She claimed that she improved if she had daily IV fluids.  She was treated on numerous occasions in the maternity admission unit with IV fluids and was admitted to the hospital on at least 1 occasion and treated hypopotassemia.  On August 04, 2011, the patient claims that she began having severe lower back pain that gradually began to involve the abdomen and would last 5 minutes and will give her a 1-minute respite and followed by 5 minutes more of the same type of pain.  She came in my office on August 05, 2011.  Her cervix was long and closed, but prior to the exam, I did a fetal fibronectin test, then I sent her to MAU for monitoring.  Monitoring did confirm that she was contracting and she was given 0.25 mg dose of terbutaline to see if it would have a favorable impact on her contractions.   She did not get complete relief from the contraction, so she was given a second dose of terbutaline.  Subsequently, she began to complain of dizziness and then had nausea and vomiting.  This was reported to me, and I requested that she will get a CBC and a comprehensive metabolic profile.  The patient was sitting up when her blood was drawn and almost immediately after her blood was drawn, she passed out and became unresponsive.  The Rapid Response Team was called and they evaluated the patient.  I arrived within 5 minutes of the time I was informed.  The patient was responsive and had a normal neurological exam.  I did call the neurologist on call and he recommended that she be worked up for syncope to include an MRI and an MRA of the brain, carotid artery Doppler studies, and echocardiogram of the heart.  Her lab work came back showing a high white count of 21,000 and a potassium of 2.7.  The patient is admitted to correct her potassium and to undergo the studies that the neurologist suggested.  Her past medical history reveals history of esophageal  reflux and hypercholesterolemia.  She has had no surgeries done except the insertion of the PICC line.  She has no known allergies, although she claims that recently the Zofran may have caused her to have chest pain.  She does take Zofran and Phenergan.  FAMILY HISTORY:  Sister has depression.  Some of her father's relatives and maternal relatives have heart disease and her mother has high blood pressure.  SOCIAL HISTORY:  She does not use tobacco, alcohol, or illegal drugs. She has a master's degree in public health.  She is a Scientist, physiological at [employer].  PHYSICAL EXAMINATION:  VITAL SIGNS:  Temperature 98.3, blood pressure 112/80, pulse 112, respirations 18. GENERAL:  A well-developed, well-nourished black female, who intermittently vomits during the examination. HEAD, EYES, EARS, NOSE AND THROAT:  Normal. NECK:  Supple. LUNGS:   Clear to A. HEART:  Normal size and sounds.  There is a slight tachycardia. ABDOMEN:  Soft.  Fundal height is 30 cm.  Fetal heart tones are normal. There are no significant accelerations, but there are no decelerations. PELVIC:  Deferred.  ADMITTING IMPRESSION: 1. Intrauterine pregnancy, 28 weeks. 2. Long history of hyperemesis gravidarum. 3. Hypopotassemia. 4. Syncope.  The patient is admitted to correct her potassium and to get a workup for syncope.     Malachi Pro. Ambrose Mantle, M.D.     TFH/MEDQ  D:  08/05/2011  T:  08/05/2011  Job:  [id number]

## 2011-08-06 ENCOUNTER — Inpatient Hospital Stay (HOSPITAL_COMMUNITY): Payer: BC Managed Care – PPO

## 2011-08-06 DIAGNOSIS — R55 Syncope and collapse: Secondary | ICD-10-CM

## 2011-08-06 LAB — CBC
HCT: 30.5 % — ABNORMAL LOW (ref 36.0–46.0)
MCH: 33.7 pg (ref 26.0–34.0)
MCV: 98.7 fL (ref 78.0–100.0)
Platelets: 234 10*3/uL (ref 150–400)
RBC: 3.09 MIL/uL — ABNORMAL LOW (ref 3.87–5.11)
WBC: 18 10*3/uL — ABNORMAL HIGH (ref 4.0–10.5)

## 2011-08-06 MED ORDER — ACETAMINOPHEN 500 MG PO TABS
1000.0000 mg | ORAL_TABLET | Freq: Four times a day (QID) | ORAL | Status: DC | PRN
  Administered 2011-08-06: 1000 mg via ORAL
  Filled 2011-08-06: qty 2

## 2011-08-06 MED ORDER — SODIUM CHLORIDE 0.9 % IJ SOLN
10.0000 mL | Freq: Two times a day (BID) | INTRAMUSCULAR | Status: DC
  Administered 2011-08-06: 10 mL via INTRAVENOUS
  Filled 2011-08-06 (×2): qty 10

## 2011-08-06 MED ORDER — ALTEPLASE 2 MG IJ SOLR
2.0000 mg | Freq: Once | INTRAMUSCULAR | Status: AC
  Administered 2011-08-06: 2 mg
  Filled 2011-08-06: qty 2

## 2011-08-06 NOTE — Progress Notes (Signed)
Pt feels better. Her studies are underway but not ompleted. The brain MR is normal and the MRA is normal. Her potassium is normal and her WBC has decreased.Will D/C in AM if studies are normal and she continues to do well.

## 2011-08-06 NOTE — Procedures (Signed)
IV team RN here to inject 2cc of tpa in purple port

## 2011-08-07 LAB — COMPREHENSIVE METABOLIC PANEL
AST: 14 U/L (ref 0–37)
BUN: 5 mg/dL — ABNORMAL LOW (ref 6–23)
CO2: 22 mEq/L (ref 19–32)
Calcium: 8.8 mg/dL (ref 8.4–10.5)
Chloride: 105 mEq/L (ref 96–112)
Creatinine, Ser: <0.47 mg/dL — ABNORMAL LOW (ref 0.50–1.10)
Total Bilirubin: 0.2 mg/dL — ABNORMAL LOW (ref 0.3–1.2)

## 2011-08-07 MED ORDER — PROMETHAZINE HCL 25 MG/ML IJ SOLN
25.0000 mg | Freq: Once | INTRAVENOUS | Status: DC
  Filled 2011-08-07: qty 1

## 2011-08-07 NOTE — Progress Notes (Signed)
Pt has had her studies completed and all are normal. She is ready for discharge.Her results are by word of mouth since the results are not in her chart.

## 2011-08-07 NOTE — Discharge Summary (Signed)
[name], [name]             ACCOUNT NO.:  [id number]  MEDICAL RECORD NO.:  [id number]  LOCATION:  9152                          [facility]ILITY:  [facility]  PHYSICIAN:  Malachi Pro. Ambrose Mantle, M.D. DATE OF BIRTH:  [birth year]  DATE OF ADMISSION:  08/05/2011 DATE OF DISCHARGE:  08/07/2011                              DISCHARGE SUMMARY   This is a 25 year old black female para 0, gravida 1 with [facility] October 27, 2011 admitted after an episode of syncope.  I did consult with a neurologist who recommended MRI and MRA of the brain, carotid artery Doppler studies, and fetal echocardiogram.  The patient underwent those studies and verbal report is that they were all normal.  However, since we are dealing with 2 computer systems, we do not know officially that they were normal.  We were told that they were normal verbally.  She was admitted with a low potassium level.  She was given potassium in her IV and improved the level to normal and at this point she is ready for discharge.  Her lab data on admission showed a potassium of 2.7 and a white count 21,900.  On the day after admission her potassium was 3.6 and her white count decreased to 18,000.  FINAL DIAGNOSES:  Intrauterine pregnancy at 28 weeks with a episode of syncope.  No operations were done.  MRI, MRA of the brain, carotid artery Doppler, and echocardiogram were done verbally. The reports on the echocardiogram and Doppler artery studies were normal.  There is no official report documenting that.  There is a report of the MRI and the MRA of the brain.  They were normal.  The patient is to continue her IV fluids at home through the PICC line.  She gets 2000 mL every night and she is to return to the office, was scheduled appointment on August 11, 2011.     Malachi Pro. Ambrose Mantle, M.D.     TFH/MEDQ  D:  08/07/2011  T:  08/07/2011  Job:  [id number]

## 2011-08-10 NOTE — Progress Notes (Signed)
UR chart review completed.  

## 2011-08-25 ENCOUNTER — Emergency Department (HOSPITAL_COMMUNITY)
Admission: EM | Admit: 2011-08-25 | Discharge: 2011-08-26 | Disposition: A | Discharge status: Other Acute Inpatient Hospital | Payer: BC Managed Care – PPO | Attending: Emergency Medicine | Admitting: Emergency Medicine

## 2011-08-25 DIAGNOSIS — IMO0002 Reserved for concepts with insufficient information to code with codable children: Secondary | ICD-10-CM | POA: Insufficient documentation

## 2011-08-25 DIAGNOSIS — O99891 Other specified diseases and conditions complicating pregnancy: Secondary | ICD-10-CM | POA: Insufficient documentation

## 2011-08-26 ENCOUNTER — Observation Stay (HOSPITAL_COMMUNITY)
Admission: AD | Admit: 2011-08-26 | Discharge: 2011-08-27 | Disposition: A | Discharge status: Home / Self Care | Payer: BC Managed Care – PPO | Source: Other Acute Inpatient Hospital | Attending: Obstetrics and Gynecology | Admitting: Obstetrics and Gynecology

## 2011-08-26 ENCOUNTER — Encounter (HOSPITAL_COMMUNITY): Payer: Self-pay | Admitting: *Deleted

## 2011-08-26 DIAGNOSIS — R634 Abnormal weight loss: Secondary | ICD-10-CM

## 2011-08-26 DIAGNOSIS — T8579XA Infection and inflammatory reaction due to other internal prosthetic devices, implants and grafts, initial encounter: Secondary | ICD-10-CM | POA: Insufficient documentation

## 2011-08-26 DIAGNOSIS — L0390 Cellulitis, unspecified: Secondary | ICD-10-CM

## 2011-08-26 DIAGNOSIS — O99891 Other specified diseases and conditions complicating pregnancy: Principal | ICD-10-CM | POA: Insufficient documentation

## 2011-08-26 DIAGNOSIS — IMO0002 Reserved for concepts with insufficient information to code with codable children: Secondary | ICD-10-CM | POA: Insufficient documentation

## 2011-08-26 DIAGNOSIS — O2690 Pregnancy related conditions, unspecified, unspecified trimester: Secondary | ICD-10-CM

## 2011-08-26 DIAGNOSIS — Y849 Medical procedure, unspecified as the cause of abnormal reaction of the patient, or of later complication, without mention of misadventure at the time of the procedure: Secondary | ICD-10-CM | POA: Insufficient documentation

## 2011-08-26 LAB — CBC
HCT: 34.3 % — ABNORMAL LOW (ref 36.0–46.0)
Hemoglobin: 12.1 g/dL (ref 12.0–15.0)
MCHC: 35.3 g/dL (ref 30.0–36.0)
RBC: 3.57 MIL/uL — ABNORMAL LOW (ref 3.87–5.11)
WBC: 18.6 10*3/uL — ABNORMAL HIGH (ref 4.0–10.5)

## 2011-08-26 LAB — DIFFERENTIAL
Basophils Absolute: 0 10*3/uL (ref 0.0–0.1)
Basophils Relative: 0 % (ref 0–1)
Lymphocytes Relative: 15 % (ref 12–46)
Neutro Abs: 13.2 10*3/uL — ABNORMAL HIGH (ref 1.7–7.7)
Neutrophils Relative %: 71 % (ref 43–77)

## 2011-08-26 LAB — BASIC METABOLIC PANEL
BUN: 6 mg/dL (ref 6–23)
CO2: 19 mEq/L (ref 19–32)
Chloride: 104 mEq/L (ref 96–112)
Glucose, Bld: 81 mg/dL (ref 70–99)
Potassium: 3.5 mEq/L (ref 3.5–5.1)
Sodium: 134 mEq/L — ABNORMAL LOW (ref 135–145)

## 2011-08-26 MED ORDER — ZOLPIDEM TARTRATE 10 MG PO TABS: 10.0000 mg | ORAL_TABLET | Freq: Every evening | ORAL | Status: DC | PRN

## 2011-08-26 MED ORDER — CEFAZOLIN SODIUM 1-5 GM-% IV SOLN
1.0000 g | Freq: Three times a day (TID) | INTRAVENOUS | Status: DC
  Administered 2011-08-26 – 2011-08-27 (×4): 1 g via INTRAVENOUS
  Filled 2011-08-26 (×6): qty 50

## 2011-08-26 MED ORDER — SODIUM CHLORIDE 0.9 % IV SOLN: 250.0000 mL | INTRAVENOUS | Status: DC

## 2011-08-26 MED ORDER — DOCUSATE SODIUM 100 MG PO CAPS
100.0000 mg | ORAL_CAPSULE | Freq: Every day | ORAL | Status: DC
  Filled 2011-08-26 (×2): qty 1

## 2011-08-26 MED ORDER — SODIUM CHLORIDE 0.9 % IJ SOLN: 3.0000 mL | Freq: Two times a day (BID) | INTRAMUSCULAR | Status: DC

## 2011-08-26 MED ORDER — DIPHENHYDRAMINE HCL 25 MG PO CAPS
25.0000 mg | ORAL_CAPSULE | Freq: Four times a day (QID) | ORAL | Status: DC | PRN
  Administered 2011-08-26 (×3): 25 mg via ORAL
  Filled 2011-08-26 (×3): qty 1

## 2011-08-26 MED ORDER — SODIUM CHLORIDE 0.9 % IJ SOLN: 3.0000 mL | INTRAMUSCULAR | Status: DC | PRN

## 2011-08-26 MED ORDER — CALCIUM CARBONATE ANTACID 500 MG PO CHEW
2.0000 | CHEWABLE_TABLET | ORAL | Status: DC | PRN
  Filled 2011-08-26: qty 2

## 2011-08-26 MED ORDER — PRENATAL PLUS 27-1 MG PO TABS
1.0000 | ORAL_TABLET | Freq: Every day | ORAL | Status: DC
  Administered 2011-08-26: 1 via ORAL
  Filled 2011-08-26 (×3): qty 1

## 2011-08-26 MED ORDER — ACETAMINOPHEN 325 MG PO TABS
650.0000 mg | ORAL_TABLET | ORAL | Status: DC | PRN
  Administered 2011-08-26: 650 mg via ORAL
  Filled 2011-08-26: qty 2

## 2011-08-26 NOTE — Progress Notes (Signed)
The pt is on ancef. When the PICC line was removed no culture was done so the nurse here sent a culture this AM On exam there is no drainage from the PICC line site. There is no erythema around the site but there is erythema below the site. I do not know if this represents cellulitis or skin irritation from an external irritant such as tape.

## 2011-08-26 NOTE — Progress Notes (Signed)
UR chart review completed.  

## 2011-08-26 NOTE — Progress Notes (Signed)
Dr. Jackelyn Knife at bedside.  assessment done and poc discussed with pt.

## 2011-08-26 NOTE — Progress Notes (Signed)
Pt is 31+ weeks pregnant, seen at [facility] for PICC line infection.  PICC line removed, she is being admitted for IV antibiotics.  H&P dictated, [id number]. Afeb, VSS FHT- Cat II, no decels or ctx Right arm with erythema and purulent discharge from PICC line site Will admit and put on IV Ancef for cellulitis, has received first dose already.

## 2011-08-26 NOTE — Progress Notes (Signed)
Pt G1 at 31.1wks, seen at [facility] for cellulitis and PICC line removal this am.  Pt arrived via [facility].   Pt reports having a clear/brownish discharge with a fishy odor and irregular contractions.

## 2011-08-26 NOTE — H&P (Signed)
[name], [name]             ACCOUNT NO.:  [id number]  MEDICAL RECORD NO.:  [id number]  LOCATION:  9156                          [facility]ACILITY:  [facility]  PHYSICIAN:  Zenaida Niece, M.D.DATE OF BIRTH:  Mar 29, 1986  DATE OF ADMISSION:  08/26/2011 DATE OF DISCHARGE:                             HISTORY & PHYSICAL   CHIEF COMPLAINT:  PICC line infection.  HISTORY OF PRESENT ILLNESS:  This is a 25 year old gravida 1, para 0 with an EGA of 31+ weeks, who has been followed in the office multiple times and seen in the hospital multiple times for nausea, vomiting, pregnancy/hyperemesis.  Most recently, she has had a PICC line for the past 1-2 months and received two bags of IV fluids every night.  She called the office yesterday, complaining of redness and some drainage around her PICC line site.  She was instructed to go to [facility] emergency room to have this evaluated.  She went early this morning to have this evaluated and evaluation there did reveal an infected PICC line site.  Her PICC line was removed.  She had no fever, but white count was 18.6 with a left shift and the remainder of her labs were normal.  This was felt to be a significant enough infection, even though she did not have a fever that she should receive IV antibiotics.  She was transferred to MAU where I agreed with the assessment from the emergency room physicians and she is being admitted for IV antibiotics for cellulitis from her PICC line.  Prenatal care has been complicated by hyperemesis as mentioned above.  She was also seen for back pain, possible contractions and pelvic pressure on August 22.  Fetal fibronectin was negative.  During evaluation at that time she had a syncopal episode, was admitted, and had a normal evaluation.  Please see prenatal records for complete prenatal history.  PAST MEDICAL HISTORY:  Significant for gastroesophageal reflux and hypercholesterolemia.  PAST SURGICAL HISTORY:   Negative.  ALLERGIES:  None known.  CURRENT MEDICATIONS:  Just IV fluids with Phenergan at night.  FAMILY HISTORY:  No GYN or colon cancer.  REVIEW OF SYSTEMS:  Normal complaints of pregnancy and she has had no fever or chills and has had good fetal movement.  PHYSICAL EXAMINATION:  GENERAL:  This is a well-developed female, in no acute distress. VITAL SIGNS:  She is afebrile with stable vital signs. NECK:  Supple without lymphadenopathy or thyromegaly. LUNGS:  Clear to auscultation. HEART:  Regular rate and rhythm without murmur. ABDOMEN:  Gravid and nontender.  Fetal heart tracing is category II with no decelerations and no significant contractions.  Cervical exam is deferred. SKIN:  Her right arm has erythema and purulent discharge from her PICC line site and that PICC line has been removed.  ADMISSION LABS:  Basic metabolic panel is normal.  CBC reveals white count of 18.6, hemoglobin 12.1, platelets of 275,000.  Again, a left shift.  ASSESSMENT:  Intrauterine pregnancy at 31+ weeks with cellulitis of her right arm from a PICC line.  The PICC line has been removed and she has no evidence of sepsis with no fever although she does have an elevated white count.  PLAN:  To admit the patient for observation and put her on IV Ancef to treat the cellulitis.  We will see how she responds and decide if she needs a longer hospital stay or she can convert to p.o. antibiotics and go home.     Zenaida Niece, M.D.     TDM/MEDQ  D:  08/26/2011  T:  08/26/2011  Job:  [id number]

## 2011-08-26 NOTE — ED Notes (Signed)
Pt up to bathroom.

## 2011-08-26 NOTE — ED Notes (Signed)
Dr. Meisinger notified of pt.  MD will be in to see pt. 

## 2011-08-27 MED ORDER — SODIUM CHLORIDE 0.9 % IV SOLN
INTRAVENOUS | Status: DC
  Administered 2011-08-27: 03:00:00 via INTRAVENOUS

## 2011-08-27 MED ORDER — CEPHALEXIN 500 MG PO CAPS: 500.0000 mg | ORAL_CAPSULE | Freq: Four times a day (QID) | ORAL | Status: DC

## 2011-08-27 NOTE — Discharge Summary (Signed)
[name], [name]             ACCOUNT NO.:  [id number]  MEDICAL RECORD NO.:  [id number]  LOCATION:  9156                          [facility]ACILITY:  [facility]  PHYSICIAN:  Malachi Pro. Ambrose Mantle, M.D. DATE OF BIRTH:  [birth year]  DATE OF ADMISSION:  08/26/2011 DATE OF DISCHARGE:  08/27/2011                              DISCHARGE SUMMARY   A 25 year old black female para 0 gravida 1 at 31+ weeks' gestation, has been followed in the office throughout the pregnancy with nausea, vomiting, treated with initially IV fluids until her IV sites ran out and subsequently with a PICC line.  Her home health nurse found the redness around her IV site.  She went to [facility] and on the day of admission, the PICC line was evaluated there and the PICC line IV site was removed.  Apparently, there was some greenish drainage from the IV site.  No culture was done of the fluid or the IV PICC line.  The patient was transferred to [facility] and IV antibiotics were given, Ancef as the agent  Her past medical history, surgical history and allergies are in the admission history and physical note.  On admission, she was afebrile with normal vital signs.  Her physical exam was consistent with a 31-week pregnancy.  There was some erythema around the right arm, but it was all distal to the PICC line site. There was apparently some purulent drainage from the PICC line site, but on my exams, I never saw any significant drainage.  She was admitted with a diagnosis of pregnancy at 31+ weeks and cellulitis of the site close to her PICC line.  She was treated with IV Ancef.  She did well. I examined the arm at least 3 times.  I am convinced there is no accentuation of the problem.  It is difficult to tell whether it is true cellulitis or whether the redness of the arm is a skin reaction to tape that she has had around the arm.  LABORATORY DATA:  White count 18,600, hemoglobin 12.1, hematocrit 34.3, platelet  count 134,000.  BMP was normal.  The patient had no blood tests repeated.  She is remained afebrile and she is ready for discharge.  FINAL DIAGNOSES: 1. Intrauterine pregnancy at 31+ weeks. 2. Infected PICC line site.  OPERATIONS:  None.  The patient is discharged on cephalexin 500 mg by mouth q.6 h for 6 days and she is to return to the office actually today at 3:15 p.m.  She is to call with any problems and she is to continue her medications that she has been taking at home.    Malachi Pro. Ambrose Mantle, M.D.    TFH/MEDQ  D:  08/27/2011  T:  08/27/2011  Job:  [id number]

## 2011-08-27 NOTE — Progress Notes (Signed)
#  1 afebrile no problems Arm looks fine Will discharge

## 2011-08-28 ENCOUNTER — Inpatient Hospital Stay (HOSPITAL_COMMUNITY)
Admission: AD | Admit: 2011-08-28 | Discharge: 2011-08-28 | Disposition: A | Discharge status: Home / Self Care | Payer: BC Managed Care – PPO | Source: Ambulatory Visit | Attending: Obstetrics and Gynecology | Admitting: Obstetrics and Gynecology

## 2011-08-28 DIAGNOSIS — Z888 Allergy status to other drugs, medicaments and biological substances status: Secondary | ICD-10-CM

## 2011-08-28 DIAGNOSIS — T361X5A Adverse effect of cephalosporins and other beta-lactam antibiotics, initial encounter: Secondary | ICD-10-CM | POA: Insufficient documentation

## 2011-08-28 DIAGNOSIS — O99891 Other specified diseases and conditions complicating pregnancy: Secondary | ICD-10-CM | POA: Insufficient documentation

## 2011-08-28 DIAGNOSIS — T7840XA Allergy, unspecified, initial encounter: Secondary | ICD-10-CM

## 2011-08-28 LAB — WOUND CULTURE: Special Requests: NORMAL

## 2011-08-28 MED ORDER — DIPHENHYDRAMINE HCL 50 MG/ML IJ SOLN: 25.0000 mg | Freq: Once | INTRAMUSCULAR | Status: DC

## 2011-08-28 MED ORDER — LACTATED RINGERS IV SOLN
INTRAVENOUS | Status: DC
  Administered 2011-08-28: 09:00:00 via INTRAVENOUS

## 2011-08-28 MED ORDER — SULFAMETHOXAZOLE-TRIMETHOPRIM 800-160 MG PO TABS: 1.0000 | ORAL_TABLET | Freq: Two times a day (BID) | ORAL | Status: AC

## 2011-08-28 MED ORDER — DIPHENHYDRAMINE HCL 50 MG/ML IJ SOLN
INTRAMUSCULAR | Status: AC
  Filled 2011-08-28: qty 1

## 2011-08-28 NOTE — ED Notes (Signed)
Pt. Using breathing techniques and husband says the contractions are getting stronger. When pt. C/O contraction and is breathing, uterus palpates soft. Pt. states she feels back pain. Toco readj'd. Occas. UC's noted on tracing and palpate very mild, but other times, uterus continues to palpate soft.

## 2011-08-28 NOTE — Progress Notes (Signed)
Pt. Brought immediately from lobby to room #7. Was sent from MD office. Was prescribed Keflex for infection in [facility] line which was removed 2 days ago. Has taken 3 doses of Keflex. States eyes were swollen X 2 days, but swelling and rash in face started this AM. Orders were called by Dr. Ellyn Hack for IV and Benadryl. However, patient states she received Benadryl in office just prior to leaving the office around [phone]. Will call Dr. Ellyn Hack for clarification. Upon arrival, patient presents with red rash and swelling on face. Few spots on chest noted, but no rash noted on other parts of body. Bilateral breath sounds clear. No swelling noted in tongue.

## 2011-08-28 NOTE — ED Provider Notes (Signed)
History   Pt is a 25 yo G1P0 at 31.3 weeks with history of hyperemesis gravidum who had a PICC line that became infected.  PICC line was removed 2 days ago and the patient was placed on Keflex. Since that time, pt developed periorbital and macular rash with an "itchy throat" that began this morning.  Pt presented to primary OB's office, given Bendaryl 50mg  PO and sent here.  Pt currently denies difficulty breathing, palpitations, chest pain, SOB.  She also reports improvement of her itchy throat s/p benadryl.  She does have some mild contractions, nausea, and good fetal activity.   Chief Complaint  Patient presents with  . Allergic Reaction   HPI  OB History    Grav Para Term Preterm Abortions TAB SAB Ect Mult Living   1               Past Medical History  Diagnosis Date  . Hyperemesis   . Acid reflux     Past Surgical History  Procedure Date  . Wisdom tooth extraction   . Peripherally inserted central catheter insertion     Family History  Problem Relation Age of Onset  . Hypertension Mother   . Depression Sister     History  Substance Use Topics  . Smoking status: Never Smoker   . Smokeless tobacco: Not on file  . Alcohol Use: No    Allergies:  Allergies  Allergen Reactions  . Zofran Nausea And Vomiting and Other (See Comments)    Chest pain and nose bleeds    Prescriptions prior to admission  Medication Sig Dispense Refill  . cephALEXin (KEFLEX) 500 MG capsule Take 1 capsule (500 mg total) by mouth 4 (four) times daily. 1 po q6h  24 capsule  0  . diphenhydrAMINE (BENADRYL) 25 MG tablet Take 25 mg by mouth every 6 (six) hours as needed. For allergies       . promethazine (PHENERGAN) 25 MG/ML injection Inject 25 mg into the vein every 6 (six) hours as needed. For nausea         Review of Systems  All other systems reviewed and are negative.   Physical Exam   Blood pressure 114/72, pulse 92, temperature 98.2 F (36.8 C), temperature source Oral, resp.  rate 18, height 5\' 4"  (1.626 m), weight 91.627 kg (202 lb), SpO2 97.00%.  Physical Exam  Constitutional: She appears well-developed and well-nourished.  HENT:  Head: Normocephalic and atraumatic.  Eyes: Pupils are equal, round, and reactive to light.  Neck: Normal range of motion. Neck supple.  Cardiovascular: Normal rate, regular rhythm and normal heart sounds.  Exam reveals no gallop and no friction rub.   No murmur heard. Respiratory: Effort normal and breath sounds normal. No respiratory distress. She has no wheezes. She has no rales. She exhibits no tenderness.  GI: Soft. She exhibits no distension and no mass. There is no tenderness. There is no rebound and no guarding.   Dilation: Closed Effacement (%): Thick Cervical Position: Posterior Exam by:: Dr. Adrian Blackwater  FHT - BR 150s, + accel, no decel. Toco - no ocontractions seen  PICC line culture shows Staph Aureus, no sensitivities yet.  MAU Course  Procedures NST  Assessment and Plan  1.  Allergic Reaction -  Patient given IV fluid bolus and monitored for 2 hours.  Patient remained stable during this time.  Patient discharged to home with Benadryl 25-50mg  every 4 hours as needed for itching.  Instructed to return to clinic if  develops difficulty breathing or palpitations. 2.  Infected PICC line - will change to Bactrim DS BID x 7 days. 3.  Pregnancy - category 1 tracing.  STINSON, JACOB JEHIEL 08/28/2011, 9:12 AM

## 2011-09-01 LAB — CULTURE, BLOOD (ROUTINE X 2)
Culture  Setup Time: 201209120849
Culture: NO GROWTH
Culture: NO GROWTH

## 2011-10-04 ENCOUNTER — Inpatient Hospital Stay (HOSPITAL_COMMUNITY)
Admission: AD | Admit: 2011-10-04 | Discharge: 2011-10-04 | Disposition: A | Discharge status: Home / Self Care | Payer: BC Managed Care – PPO | Source: Ambulatory Visit | Attending: Obstetrics and Gynecology | Admitting: Obstetrics and Gynecology

## 2011-10-04 DIAGNOSIS — O4700 False labor before 37 completed weeks of gestation, unspecified trimester: Secondary | ICD-10-CM | POA: Insufficient documentation

## 2011-10-04 NOTE — Progress Notes (Signed)
Ctx worsened over past 1-2 hrs.

## 2011-10-04 NOTE — Progress Notes (Signed)
Pt reports contractions x 24 hours, more intense tonight. Denies bleeding or ROM. Reports good fetal movement.

## 2011-10-06 ENCOUNTER — Inpatient Hospital Stay (HOSPITAL_COMMUNITY)
Admission: AD | Admit: 2011-10-06 | Discharge: 2011-10-06 | Disposition: A | Discharge status: Home / Self Care | Payer: BC Managed Care – PPO | Source: Ambulatory Visit | Attending: Obstetrics and Gynecology | Admitting: Obstetrics and Gynecology

## 2011-10-06 DIAGNOSIS — O479 False labor, unspecified: Secondary | ICD-10-CM | POA: Insufficient documentation

## 2011-10-06 NOTE — Progress Notes (Signed)
37WKS G1 SVE 1/70/-2. REACTIVE STRIP. UC'S 2-4 MIN. MODERATE ON PALPATION. ORDERS GIVEN TO MONITOR FOR ANOTHER HOUR.

## 2011-10-06 NOTE — Progress Notes (Signed)
SVE 1/70 REATIVE STRIP OCC UC'S. ORDES GIVEN TO D/C HOME.

## 2011-10-06 NOTE — Progress Notes (Signed)
PT SAYS SHE HAS BEEN HURTING SINCE 0300-  DENIES HSV AND MRSA.   VE LAST WEEK- FT.  THEN Sunday WAS HERE - VE - SAME.

## 2011-10-08 ENCOUNTER — Encounter (HOSPITAL_COMMUNITY): Payer: Self-pay | Admitting: *Deleted

## 2011-10-08 ENCOUNTER — Other Ambulatory Visit: Payer: Self-pay | Admitting: Obstetrics and Gynecology

## 2011-10-08 ENCOUNTER — Inpatient Hospital Stay (HOSPITAL_COMMUNITY)
Admission: AD | Admit: 2011-10-08 | Discharge: 2011-10-10 | DRG: 373 | Disposition: A | Discharge status: Home / Self Care | Payer: BC Managed Care – PPO | Source: Ambulatory Visit | Attending: Obstetrics and Gynecology | Admitting: Obstetrics and Gynecology

## 2011-10-08 DIAGNOSIS — O99892 Other specified diseases and conditions complicating childbirth: Principal | ICD-10-CM | POA: Diagnosis present

## 2011-10-08 DIAGNOSIS — Z22330 Carrier of Group B streptococcus: Secondary | ICD-10-CM

## 2011-10-08 LAB — CBC
HCT: 33.5 % — ABNORMAL LOW (ref 36.0–46.0)
Hemoglobin: 11.6 g/dL — ABNORMAL LOW (ref 12.0–15.0)
MCH: 33.1 pg (ref 26.0–34.0)
MCHC: 34.6 g/dL (ref 30.0–36.0)

## 2011-10-08 LAB — RPR: RPR Ser Ql: NONREACTIVE

## 2011-10-08 MED ORDER — LACTATED RINGERS IV SOLN: 500.0000 mL | INTRAVENOUS | Status: DC | PRN

## 2011-10-08 MED ORDER — DIPHENHYDRAMINE HCL 25 MG PO CAPS: 25.0000 mg | ORAL_CAPSULE | Freq: Four times a day (QID) | ORAL | Status: DC | PRN

## 2011-10-08 MED ORDER — WITCH HAZEL-GLYCERIN EX PADS: 1.0000 "application " | MEDICATED_PAD | CUTANEOUS | Status: DC | PRN

## 2011-10-08 MED ORDER — OXYTOCIN BOLUS FROM INFUSION
500.0000 mL | Freq: Once | INTRAVENOUS | Status: DC
  Filled 2011-10-08: qty 500

## 2011-10-08 MED ORDER — LANOLIN HYDROUS EX OINT: TOPICAL_OINTMENT | CUTANEOUS | Status: DC | PRN

## 2011-10-08 MED ORDER — SENNOSIDES-DOCUSATE SODIUM 8.6-50 MG PO TABS
2.0000 | ORAL_TABLET | Freq: Every day | ORAL | Status: DC
  Administered 2011-10-08 – 2011-10-09 (×2): 2 via ORAL

## 2011-10-08 MED ORDER — ZOLPIDEM TARTRATE 5 MG PO TABS: 5.0000 mg | ORAL_TABLET | Freq: Every evening | ORAL | Status: DC | PRN

## 2011-10-08 MED ORDER — ACETAMINOPHEN 325 MG PO TABS: 650.0000 mg | ORAL_TABLET | ORAL | Status: DC | PRN

## 2011-10-08 MED ORDER — OXYTOCIN 20 UNITS IN LACTATED RINGERS INFUSION - SIMPLE
INTRAVENOUS | Status: AC
  Administered 2011-10-08: 20 [IU]
  Filled 2011-10-08: qty 1000

## 2011-10-08 MED ORDER — OXYCODONE-ACETAMINOPHEN 5-325 MG PO TABS: 2.0000 | ORAL_TABLET | ORAL | Status: DC | PRN

## 2011-10-08 MED ORDER — BENZOCAINE-MENTHOL 20-0.5 % EX AERO
INHALATION_SPRAY | CUTANEOUS | Status: AC
  Filled 2011-10-08: qty 56

## 2011-10-08 MED ORDER — LIDOCAINE HCL (PF) 1 % IJ SOLN
30.0000 mL | INTRAMUSCULAR | Status: DC | PRN
  Filled 2011-10-08: qty 30

## 2011-10-08 MED ORDER — BENZOCAINE-MENTHOL 20-0.5 % EX AERO: 1.0000 "application " | INHALATION_SPRAY | CUTANEOUS | Status: DC | PRN

## 2011-10-08 MED ORDER — CITRIC ACID-SODIUM CITRATE 334-500 MG/5ML PO SOLN: 30.0000 mL | ORAL | Status: DC | PRN

## 2011-10-08 MED ORDER — LACTATED RINGERS IV SOLN: INTRAVENOUS | Status: DC

## 2011-10-08 MED ORDER — FLEET ENEMA 7-19 GM/118ML RE ENEM: 1.0000 | ENEMA | RECTAL | Status: DC | PRN

## 2011-10-08 MED ORDER — DIBUCAINE 1 % RE OINT: 1.0000 "application " | TOPICAL_OINTMENT | RECTAL | Status: DC | PRN

## 2011-10-08 MED ORDER — IBUPROFEN 600 MG PO TABS
600.0000 mg | ORAL_TABLET | Freq: Four times a day (QID) | ORAL | Status: DC
  Administered 2011-10-08 – 2011-10-10 (×7): 600 mg via ORAL
  Filled 2011-10-08 (×5): qty 1

## 2011-10-08 MED ORDER — SODIUM CHLORIDE 0.9 % IV SOLN
2.0000 g | Freq: Once | INTRAVENOUS | Status: AC
  Administered 2011-10-08: 2 g via INTRAVENOUS
  Filled 2011-10-08: qty 2000

## 2011-10-08 MED ORDER — LIDOCAINE HCL (PF) 1 % IJ SOLN
INTRAMUSCULAR | Status: AC
  Filled 2011-10-08: qty 30

## 2011-10-08 MED ORDER — SIMETHICONE 80 MG PO CHEW: 80.0000 mg | CHEWABLE_TABLET | ORAL | Status: DC | PRN

## 2011-10-08 MED ORDER — TETANUS-DIPHTH-ACELL PERTUSSIS 5-2.5-18.5 LF-MCG/0.5 IM SUSP
0.5000 mL | Freq: Once | INTRAMUSCULAR | Status: AC
  Administered 2011-10-09: 0.5 mL via INTRAMUSCULAR
  Filled 2011-10-08: qty 0.5

## 2011-10-08 MED ORDER — IBUPROFEN 600 MG PO TABS
600.0000 mg | ORAL_TABLET | Freq: Four times a day (QID) | ORAL | Status: DC | PRN
  Administered 2011-10-08: 600 mg via ORAL
  Filled 2011-10-08 (×3): qty 1

## 2011-10-08 MED ORDER — OXYTOCIN 20 UNITS IN LACTATED RINGERS INFUSION - SIMPLE: 125.0000 mL/h | Freq: Once | INTRAVENOUS | Status: DC

## 2011-10-08 MED ORDER — OXYCODONE-ACETAMINOPHEN 5-325 MG PO TABS
1.0000 | ORAL_TABLET | ORAL | Status: DC | PRN
  Administered 2011-10-09: 1 via ORAL
  Filled 2011-10-08: qty 1

## 2011-10-08 MED ORDER — PRENATAL PLUS 27-1 MG PO TABS
1.0000 | ORAL_TABLET | Freq: Every day | ORAL | Status: DC
  Administered 2011-10-09: 1 via ORAL
  Filled 2011-10-08: qty 1

## 2011-10-08 NOTE — Progress Notes (Signed)
UR Chart review completed.  

## 2011-10-08 NOTE — H&P (Signed)
[name] is a 25 y.o. female G1P0 at 37+ weeks (EDD 10/27/11 by 9 week [place]) presented to MAU with advanced cervical dilation to 9cm and had spontaneous ROM on arrival with meconium stained fluid.  I was called and came immediately.  On my arrival, pt pushing but had not delivered so moved down hall to L&D.  NICU had been called to attend due to meconium, but I allowed them to leave when baby born because had vigorous cry.  Prenatal care complicated by severe N/V of pregnancy with multiple hospital admissions and home IV therapy with a PICC line.  PICC linie bacame infected at 31 weeks and had to be removed.  Pt did okay after that with emesis 2x daily, but was able to keep down food.  Also GBS positive in a urine cx.  Maternal Medical History:  Reason for admission: Reason for admission: rupture of membranes, contractions and nausea.  Contractions: Onset was 6-12 hours ago.   Frequency: regular.   Perceived severity is strong.      OB History    Grav Para Term Preterm Abortions TAB SAB Ect Mult Living   1 1 1       1      Past Medical History  Diagnosis Date  . Hyperemesis   . Acid reflux    Past Surgical History  Procedure Date  . Wisdom tooth extraction   . Peripherally inserted central catheter insertion    Family History: family history includes Depression in her sister and Hypertension in her mother. Social History:  reports that she has never smoked. She does not have any smokeless tobacco history on file. She reports that she does not drink alcohol or use illicit drugs.  Review of Systems  Gastrointestinal: Positive for nausea and vomiting.    Dilation:  (del of viable female infant ) Station: +1 Blood pressure 130/85, pulse 121, temperature 97.6 F (36.4 C), temperature source Oral, resp. rate 18, unknown if currently breastfeeding. Maternal Exam:  Uterine Assessment: Contraction strength is firm.  Contraction frequency is regular.   Abdomen: Patient reports no  abdominal tenderness. Fetal presentation: vertex  Introitus: Normal vulva. Normal vagina.  Amniotic fluid character: meconium stained.  Pelvis: adequate for delivery.   Cervix: Cervix evaluated by digital exam.     Physical Exam  Constitutional: She is oriented to person, place, and time. She appears well-developed.  Cardiovascular: Normal rate and regular rhythm.   Respiratory: Effort normal and breath sounds normal.  GI: Soft.  Genitourinary: Vagina normal and uterus normal.       Pt was 9cm on arrival per RN and grossly ruptured for meconium stained fluid  Neurological: She is alert and oriented to person, place, and time.  Psychiatric: She has a normal mood and affect. Her behavior is normal.    Prenatal labs: ABO, Rh: O/Positive/-- (08/22 0000) Antibody: Negative (08/22 0000) Rubella: Immune (08/22 0000) RPR: Nonreactive (08/22 0000)  HBsAg: Negative (08/22 0000)  HIV: Non-reactive (08/22 0000)  GBS:   Positive One hour glucola 81 Hgb AA Declined genetic screens  Assessment/Plan: Pt moved to L&D and then delivered within 5-10 minutes.  Received ampicillin just prior to delivery.  Oliver Pila 10/08/2011, 7:18 AM

## 2011-10-08 NOTE — Progress Notes (Signed)
Pt presents with complaint of contractions and ROM upon arrival to hospital.

## 2011-10-09 LAB — CBC
Hemoglobin: 11.1 g/dL — ABNORMAL LOW (ref 12.0–15.0)
MCV: 97.9 fL (ref 78.0–100.0)
Platelets: 268 10*3/uL (ref 150–400)
RBC: 3.41 MIL/uL — ABNORMAL LOW (ref 3.87–5.11)
WBC: 20.6 10*3/uL — ABNORMAL HIGH (ref 4.0–10.5)

## 2011-10-09 NOTE — Progress Notes (Signed)
SW received referral for hx of depression.  SW reviewed MOB's chart and it states in more than one place that her sister has depression.  SW notes that the medical record does not indicate that MOB has depression and therefore, has screened out referral.  SW will gladly see MOB by her request or if issues arise.

## 2011-10-09 NOTE — Progress Notes (Signed)
Post Partum Day 1 Subjective: no complaints, up ad lib and voiding  Objective: Blood pressure 100/64, pulse 102, temperature 98.1 F (36.7 C), temperature source Oral, resp. rate 18, height 5\' 4"  (1.626 m), weight 91.627 kg (202 lb), SpO2 98.00%, unknown if currently breastfeeding.  Physical Exam:  General: alert Lochia: appropriate Uterine Fundus: firm    Basename 10/09/11 0515 10/08/11 0900  HGB 11.1* 11.6*  HCT 33.4* 33.5*    Assessment/Plan: Plan for discharge tomorrow   LOS: 1 day   [name] W 10/09/2011, 7:23 AM

## 2011-10-10 MED ORDER — IBUPROFEN 600 MG PO TABS: 600.0000 mg | ORAL_TABLET | Freq: Four times a day (QID) | ORAL | Status: AC | PRN

## 2011-10-10 MED ORDER — OXYCODONE-ACETAMINOPHEN 5-325 MG PO TABS: 1.0000 | ORAL_TABLET | Freq: Four times a day (QID) | ORAL | Status: AC | PRN

## 2011-10-10 NOTE — Discharge Summary (Signed)
[name], CLINE             ACCOUNT NO.:  [id number]  MEDICAL RECORD NO.:  [id number]  LOCATION:  9312                          FACILITY:  [facility]  PHYSICIAN:  Malachi Pro. Ambrose Mantle, M.D. DATE OF BIRTH:  1986/04/10  DATE OF ADMISSION:  10/08/2011 DATE OF DISCHARGE:  10/10/2011                              DISCHARGE SUMMARY   This is a 25 year old black female, para 0, gravida 1, EDC October 27, 2011, by 9-week ultrasound, presented to Maternity Admission Unit with advanced cervical dilatation at 7 cm and she had spontaneous rupture of membranes on arrival with meconium-stained fluid.  Dr. Senaida Ores was called.  She came immediately to the hospital.  The patient did not deliver promptly, so she moved her to Labor and delivery.  The baby was born, 5 pounds 10 ounces.  The NICU team was in attendance because of meconium, but Dr. Senaida Ores allowed them to leave after the baby cried at birth.  Blood group and type was O positive, negative antibody, rubella immune, RPR nonreactive, hepatitis B surface antigen negative, HIV negative, group B strep was positive, 1-hour Glucola 81, and hemoglobin electrophoresis AA.  The patient had declined genetic screening.  She did receive ampicillin just prior to delivery.  The patient pushed for about 5 minutes with good effort and at 6:52 a.m. a living female infant was delivered with Apgars of 8 and 9 at 1 and 5 minutes weighing 5 pounds and 10-1/2 ounces.  Cord was extremely short, had to be cut at the perineum.  Moderate meconium was noted, but the baby had a vigorous cry, so the NICU was excused.  There were no lacerations.  No episiotomy.  Suture was not necessary.  Blood loss about 200 mL.  Postpartum, the patient did quite well.  Blood pressure remained normal.  Her nausea and vomiting which had been present throughout the pregnancy has completely disappeared.  The patient was seen by Social Work because of a history of depression and she noted  in the chart.  The patient did not indicate depression, so stated she would be glad to see the patient at our request, but felt it was unnecessary at the present time.  On the second postpartum day, the patient was afebrile.  She was doing well and ready for discharge.  Her initial hemoglobin was 11.6, hematocrit 33.5, white count 31,300 with a platelet count of 284,000.  Followup hemoglobin was 11.1 with white count of 20,600 and platelet count 260,000.  RPR was nonreactive.  Blood group and type O positive.  FINAL DIAGNOSIS:  Intrauterine pregnancy at 38 weeks, delivered vertex.  OPERATION:  Spontaneous delivery, vertex.  No repair was necessary.  FINAL CONDITION:  Improved.  INSTRUCTIONS:  Regular discharge instruction booklet.  The patient is going to be given a prescription for Percocet and Motrin, Percocet 5/325, #20 tablets 1 every 4-6 hours as needed for pain and Motrin 600 mg 20 tablets 1 every 6 hours as needed for pain.  She is to return to the office in 6 weeks for followup examination.    Malachi Pro. Ambrose Mantle, M.D.    TFH/MEDQ  D:  10/10/2011  T:  10/10/2011  Job:  [id number]

## 2011-10-10 NOTE — Progress Notes (Signed)
#  2 afebrile BP normal for D/C. 

## 2011-10-15 ENCOUNTER — Encounter (HOSPITAL_COMMUNITY): Payer: BC Managed Care – PPO

## 2011-10-27 ENCOUNTER — Inpatient Hospital Stay (HOSPITAL_COMMUNITY): Admission: AD | Admit: 2011-10-27 | Payer: Self-pay | Source: Ambulatory Visit | Admitting: Obstetrics and Gynecology

## 2012-12-14 NOTE — L&D Delivery Note (Signed)
Delivery Note At C-C/+1, AROM of at least a forebag revealed moderate meconium.  She then pushed well.  At 5:01 AM a viable female was delivered via Vaginal, Spontaneous Delivery (Presentation: Right Occiput Anterior).  APGAR: 8, 9; weight pending.   Placenta status: Intact, Spontaneous.  Cord:  with the following complications: None.  Anesthesia: Epidural  Episiotomy: None Lacerations: None Suture Repair: none Est. Blood Loss (mL): 300  Mom to postpartum.  Baby to stay with mom.  [name] D 08/05/2013, 5:16 AM

## 2013-01-14 ENCOUNTER — Encounter (HOSPITAL_COMMUNITY): Payer: Self-pay | Admitting: *Deleted

## 2013-01-14 ENCOUNTER — Inpatient Hospital Stay (HOSPITAL_COMMUNITY)
Admission: AD | Admit: 2013-01-14 | Discharge: 2013-01-14 | Disposition: A | Discharge status: Home / Self Care | Payer: BC Managed Care – PPO | Source: Ambulatory Visit | Attending: Obstetrics and Gynecology | Admitting: Obstetrics and Gynecology

## 2013-01-14 DIAGNOSIS — O210 Mild hyperemesis gravidarum: Secondary | ICD-10-CM | POA: Insufficient documentation

## 2013-01-14 LAB — COMPREHENSIVE METABOLIC PANEL
AST: 38 U/L — ABNORMAL HIGH (ref 0–37)
Albumin: 4.5 g/dL (ref 3.5–5.2)
Alkaline Phosphatase: 71 U/L (ref 39–117)
Chloride: 96 mEq/L (ref 96–112)
Potassium: 2.9 mEq/L — ABNORMAL LOW (ref 3.5–5.1)
Total Bilirubin: 1.3 mg/dL — ABNORMAL HIGH (ref 0.3–1.2)

## 2013-01-14 LAB — URINALYSIS, ROUTINE W REFLEX MICROSCOPIC
Leukocytes, UA: NEGATIVE
Nitrite: NEGATIVE
Specific Gravity, Urine: 1.025 (ref 1.005–1.030)
pH: 6 (ref 5.0–8.0)

## 2013-01-14 LAB — CBC
MCH: 32.7 pg (ref 26.0–34.0)
MCV: 90.8 fL (ref 78.0–100.0)
Platelets: 347 10*3/uL (ref 150–400)
RDW: 12.5 % (ref 11.5–15.5)

## 2013-01-14 MED ORDER — PROMETHAZINE HCL 25 MG/ML IJ SOLN
25.0000 mg | Freq: Once | INTRAVENOUS | Status: AC
  Administered 2013-01-14: 25 mg via INTRAVENOUS
  Filled 2013-01-14: qty 1

## 2013-01-14 MED ORDER — PROMETHAZINE HCL 25 MG RE SUPP: 25.0000 mg | Freq: Four times a day (QID) | RECTAL | Status: DC | PRN

## 2013-01-14 MED ORDER — PROMETHAZINE HCL 12.5 MG PO TABS
12.5000 mg | ORAL_TABLET | Freq: Four times a day (QID) | ORAL | Status: DC | PRN
Start: 2013-01-14 — End: 2013-01-30

## 2013-01-14 MED ORDER — POTASSIUM CHLORIDE ER 10 MEQ PO TBCR: 10.0000 meq | EXTENDED_RELEASE_TABLET | Freq: Three times a day (TID) | ORAL | Status: DC

## 2013-01-14 MED ORDER — LACTATED RINGERS IV BOLUS (SEPSIS)
1000.0000 mL | Freq: Once | INTRAVENOUS | Status: AC
  Administered 2013-01-14: 1000 mL via INTRAVENOUS

## 2013-01-14 NOTE — MAU Provider Note (Signed)
History     CSN: [id number]  Arrival date and time: 01/14/13 1213   First Provider Initiated Contact with Patient 01/14/13 1245      Chief Complaint  Patient presents with  . Morning Sickness   HPI [name] is a 27 y.o. G2P1001 at [redacted]w[redacted]d who presents to MAU with N/V since Thursday. The patient was given nausea medication that has not helped. She has a history of hyperemesis. She has had some lower abdominal pain. She denies fever, vaginal bleeding or abnormal discharge. She has not been able to take in any PO solids or liquids. She has had chills.   OB History    Grav Para Term Preterm Abortions TAB SAB Ect Mult Living   2 1 1       1       Past Medical History  Diagnosis Date  . Hyperemesis   . Acid reflux     Past Surgical History  Procedure Date  . Wisdom tooth extraction   . Peripherally inserted central catheter insertion     Family History  Problem Relation Age of Onset  . Hypertension Mother   . Depression Sister     History  Substance Use Topics  . Smoking status: Never Smoker   . Smokeless tobacco: Not on file  . Alcohol Use: No    Allergies:  Allergies  Allergen Reactions  . Cephalosporins Nausea And Vomiting and Rash  . Peanut-Containing Drug Products Other (See Comments)    Reaction unknown  . Zofran Nausea And Vomiting and Other (See Comments)    Chest pain and nose bleeds    Prescriptions prior to admission  Medication Sig Dispense Refill  . prenatal vitamin w/FE, FA (PRENATAL 1 + 1) 27-1 MG TABS Take 1 tablet by mouth daily.          ROS All negative unless otherwise noted in HPI Physical Exam   Blood pressure 136/78, pulse 91, temperature 97.8 F (36.6 C), temperature source Oral, resp. rate 18, last menstrual period 11/11/2012.  Physical Exam  Constitutional: She is oriented to person, place, and time. She appears well-developed and well-nourished.  HENT:  Head: Normocephalic.  Cardiovascular: Normal rate.    Respiratory: Effort normal.  GI: Soft.  Neurological: She is alert and oriented to person, place, and time.  Skin: Skin is warm and dry.  Psychiatric: She has a normal mood and affect.    MAU Course  Procedures  MDM Discussed patient with Dr. Ambrose Mantle. He is ok with IV LR and Phenergan infusion. CMP to check electrolytes and UA for dehydration status.  Discussed CMP and CBC results with Dr. Ambrose Mantle. He would like patient to be discharged with Rx for K-Dur to be taken 10 mEq TID x 1 week and have repeat CBC and CMP in MAU on Tuesday.  Patient received 1L LR with 25 mg Phenergan infusion. Patient is resting comfortably now. She feels improved nausea and has not had any additional vomiting 1 additional liter LR to ensure adequate hydration prior to discharge Patient tried to take some sips of sprite and started to have increased nausea Stopped PO intake while last liter of LR running. Patient fell asleep. After 2nd liter of LR patient feels much improved. Feels ready for discharge.   Assessment and Plan  A: Hyperemesis in pregnancy  P: Discharge home Rx for K-Dur sent to patient's pharmacy Rx for Phenergan PO and suppositories sent to patient's pharmacy. Patient given strict instructions that medications have the same name,  so read instructions before taking.  Patient will follow-up in MAU on Tuesday for repeat labs Patient will keep scheduled follow-up with Dr. Ambrose Mantle Patient may return to MAU as needed or if her condition were to change or worsen.   Freddi Starr, PA-C 01/14/2013, 12:46 PM

## 2013-01-14 NOTE — MAU Note (Addendum)
Pt reports having n/v since Thursday. Given and Rx for medication but it has not helped. Can't keep anything down. Has had severe hyperemesis with previous pregnancies

## 2013-01-16 LAB — URINE CULTURE: Colony Count: 45000

## 2013-01-17 ENCOUNTER — Encounter (HOSPITAL_COMMUNITY): Payer: Self-pay | Admitting: *Deleted

## 2013-01-17 ENCOUNTER — Inpatient Hospital Stay (HOSPITAL_COMMUNITY)
Admission: AD | Admit: 2013-01-17 | Discharge: 2013-01-17 | Disposition: A | Discharge status: Home / Self Care | Payer: BC Managed Care – PPO | Source: Ambulatory Visit | Attending: Obstetrics and Gynecology | Admitting: Obstetrics and Gynecology

## 2013-01-17 DIAGNOSIS — N390 Urinary tract infection, site not specified: Secondary | ICD-10-CM

## 2013-01-17 DIAGNOSIS — E876 Hypokalemia: Secondary | ICD-10-CM | POA: Insufficient documentation

## 2013-01-17 DIAGNOSIS — O2390 Unspecified genitourinary tract infection in pregnancy, unspecified trimester: Secondary | ICD-10-CM | POA: Insufficient documentation

## 2013-01-17 DIAGNOSIS — O211 Hyperemesis gravidarum with metabolic disturbance: Secondary | ICD-10-CM

## 2013-01-17 DIAGNOSIS — O2341 Unspecified infection of urinary tract in pregnancy, first trimester: Secondary | ICD-10-CM

## 2013-01-17 DIAGNOSIS — E860 Dehydration: Secondary | ICD-10-CM | POA: Insufficient documentation

## 2013-01-17 LAB — CBC
MCH: 33.3 pg (ref 26.0–34.0)
Platelets: 337 10*3/uL (ref 150–400)
RBC: 4.68 MIL/uL (ref 3.87–5.11)
WBC: 20.4 10*3/uL — ABNORMAL HIGH (ref 4.0–10.5)

## 2013-01-17 LAB — COMPREHENSIVE METABOLIC PANEL WITH GFR
ALT: 57 U/L — ABNORMAL HIGH (ref 0–35)
AST: 31 U/L (ref 0–37)
Albumin: 4.7 g/dL (ref 3.5–5.2)
Alkaline Phosphatase: 85 U/L (ref 39–117)
BUN: 15 mg/dL (ref 6–23)
CO2: 24 meq/L (ref 19–32)
Calcium: 11.2 mg/dL — ABNORMAL HIGH (ref 8.4–10.5)
Chloride: 95 meq/L — ABNORMAL LOW (ref 96–112)
Creatinine, Ser: 0.72 mg/dL (ref 0.50–1.10)
GFR calc Af Amer: >90 mL/min
GFR calc non Af Amer: >90 mL/min
Glucose, Bld: 129 mg/dL — ABNORMAL HIGH (ref 70–99)
Potassium: 3.1 meq/L — ABNORMAL LOW (ref 3.5–5.1)
Sodium: 136 meq/L (ref 135–145)
Total Bilirubin: 1.4 mg/dL — ABNORMAL HIGH (ref 0.3–1.2)
Total Protein: 8.5 g/dL — ABNORMAL HIGH (ref 6.0–8.3)

## 2013-01-17 LAB — URINALYSIS, ROUTINE W REFLEX MICROSCOPIC
Glucose, UA: NEGATIVE mg/dL
Ketones, ur: >80 mg/dL — AB
Leukocytes, UA: NEGATIVE
Nitrite: POSITIVE — AB
Protein, ur: 100 mg/dL — AB
Specific Gravity, Urine: >1.03 — ABNORMAL HIGH (ref 1.005–1.030)
Urobilinogen, UA: 4 mg/dL — ABNORMAL HIGH (ref 0.0–1.0)
pH: 6 (ref 5.0–8.0)

## 2013-01-17 LAB — URINE MICROSCOPIC-ADD ON

## 2013-01-17 MED ORDER — NITROFURANTOIN MONOHYD MACRO 100 MG PO CAPS: 100.0000 mg | ORAL_CAPSULE | Freq: Two times a day (BID) | ORAL | Status: DC

## 2013-01-17 MED ORDER — M.V.I. ADULT IV INJ
10.0000 mL | Freq: Once | INTRAVENOUS | Status: AC
  Administered 2013-01-17: 10 mL via INTRAVENOUS
  Filled 2013-01-17: qty 10

## 2013-01-17 MED ORDER — PROMETHAZINE HCL 25 MG/ML IJ SOLN
25.0000 mg | Freq: Once | INTRAMUSCULAR | Status: AC
  Administered 2013-01-17: 25 mg via INTRAVENOUS
  Filled 2013-01-17: qty 1

## 2013-01-17 NOTE — MAU Provider Note (Signed)
History     CSN: [id number]  Arrival date and time: 01/17/13 1702   First Provider Initiated Contact with Patient 01/17/13 1841      Chief Complaint  Patient presents with  . Labs Only   HPI [name] is a 27 y.o. G2P1001 at [redacted]w[redacted]d presents to MAU today for follow-up labs. She is still feeling very weak and dehydrated. She has improved N/V with the Phenergan suppositories since being here on Saturday. She is still nauseous and vomiting on occasion. She has still had little PO intake. She denies fever.   OB History    Grav Para Term Preterm Abortions TAB SAB Ect Mult Living   2 1 1       1       Past Medical History  Diagnosis Date  . Hyperemesis   . Acid reflux     Past Surgical History  Procedure Date  . Wisdom tooth extraction   . Peripherally inserted central catheter insertion     Family History  Problem Relation Age of Onset  . Hypertension Mother   . Depression Sister     History  Substance Use Topics  . Smoking status: Never Smoker   . Smokeless tobacco: Not on file  . Alcohol Use: No    Allergies:  Allergies  Allergen Reactions  . Cephalosporins Nausea And Vomiting and Rash  . Peanut-Containing Drug Products Other (See Comments)    Reaction unknown  . Zofran Nausea And Vomiting and Other (See Comments)    Chest pain and nose bleeds    Prescriptions prior to admission  Medication Sig Dispense Refill  . Doxylamine-Pyridoxine (DICLEGIS) 10-10 MG TBEC Take 1 tablet by mouth daily.      . potassium chloride (K-DUR) 10 MEQ tablet Take 1 tablet (10 mEq total) by mouth 3 (three) times daily.  14 tablet  0  . Prenatal Vit-Fe Fumarate-FA (PRENATAL MULTIVITAMIN) TABS Take 1 tablet by mouth daily.      . promethazine (PHENERGAN) 12.5 MG tablet Take 1 tablet (12.5 mg total) by mouth every 6 (six) hours as needed for nausea.  30 tablet  0  . promethazine (PHENERGAN) 25 MG suppository Place 1 suppository (25 mg total) rectally every 6 (six) hours as  needed for nausea.  30 each  0    Review of Systems  Constitutional: Positive for chills and malaise/fatigue. Negative for fever.  Gastrointestinal: Positive for nausea, vomiting and abdominal pain. Negative for diarrhea.  Musculoskeletal: Positive for myalgias.  Neurological: Positive for weakness. Negative for headaches.   Physical Exam   Blood pressure 116/83, pulse 116, temperature 98.8 F (37.1 C), temperature source Oral, resp. rate 16, height 5\' 6"  (1.676 m), weight 211 lb (95.709 kg), last menstrual period 11/11/2012, SpO2 99.00%, not currently breastfeeding.  Physical Exam  Constitutional: She is oriented to person, place, and time. She appears well-developed. She appears ill.  HENT:  Head: Normocephalic.  Cardiovascular: Normal rate, regular rhythm and normal heart sounds.   Respiratory: Effort normal and breath sounds normal. No respiratory distress.  GI: Soft. Bowel sounds are normal. She exhibits no distension and no mass. There is tenderness (diffuse tenderness to palpation of the abdomen). There is no rebound and no guarding.  Neurological: She is alert and oriented to person, place, and time.  Skin: Skin is warm and dry. No pallor (patient appears flushed in her cheeks).  Psychiatric: She has a normal mood and affect.    MAU Course  Procedures None  MDM Discussed  patient with Dr. Ellyn Hack. She would like patient to receive fluids today including a "Banana bag" for supplementation. She also recommends Macrobid for UTI with recommendation that she can use it vaginally if N/V makes her unable to take PO  1915 - IV started. 1L LR with 25 mg Phenergan infusion.  2000 - Care turned over to [place], CNM  Assessment and Plan  A: Hyperemesis UTI in pregnancy Hypokalemia Dehydration  P: Discharge home Patient to continue K supplements Rx for Macrobid sent to patient's pharmacy Patient to continue Phenergan suppositories PRN for N/V Patient to follow-up with  [facility] as scheduled Patient may return to MAU as needed or if her condition were to change or worsen.  Freddi Starr, PA-C 01/17/2013, 7:24 PM   Ivonne Andrew, CNM assumed care of pt at 2000. IV fluids in process. No vomiting.  2200: No vomiting during MAU visit. Tolerating POs after phenergan.   D/C home.  [place], [place] 01/17/2013 10:05 PM

## 2013-01-17 NOTE — MAU Note (Addendum)
Pt here for f/u labwork (cbc, cmp). Denies pain or bleeding. States she is feeling slightly improved since last MAU visit

## 2013-01-19 ENCOUNTER — Encounter (HOSPITAL_COMMUNITY): Payer: Self-pay | Admitting: *Deleted

## 2013-01-19 ENCOUNTER — Inpatient Hospital Stay (HOSPITAL_COMMUNITY)
Admission: AD | Admit: 2013-01-19 | Discharge: 2013-01-20 | Disposition: A | Discharge status: Home / Self Care | Payer: BC Managed Care – PPO | Source: Ambulatory Visit | Attending: Obstetrics and Gynecology | Admitting: Obstetrics and Gynecology

## 2013-01-19 DIAGNOSIS — R350 Frequency of micturition: Secondary | ICD-10-CM | POA: Insufficient documentation

## 2013-01-19 DIAGNOSIS — O210 Mild hyperemesis gravidarum: Secondary | ICD-10-CM | POA: Insufficient documentation

## 2013-01-19 DIAGNOSIS — M549 Dorsalgia, unspecified: Secondary | ICD-10-CM | POA: Insufficient documentation

## 2013-01-19 LAB — COMPREHENSIVE METABOLIC PANEL
AST: 69 U/L — ABNORMAL HIGH (ref 0–37)
Albumin: 4.3 g/dL (ref 3.5–5.2)
BUN: 7 mg/dL (ref 6–23)
Chloride: 93 mEq/L — ABNORMAL LOW (ref 96–112)
Creatinine, Ser: 0.73 mg/dL (ref 0.50–1.10)
Total Bilirubin: 1.6 mg/dL — ABNORMAL HIGH (ref 0.3–1.2)
Total Protein: 7.8 g/dL (ref 6.0–8.3)

## 2013-01-19 LAB — CBC WITH DIFFERENTIAL/PLATELET
Basophils Absolute: 0 10*3/uL (ref 0.0–0.1)
Basophils Relative: 0 % (ref 0–1)
Eosinophils Absolute: 0 10*3/uL (ref 0.0–0.7)
HCT: 41.5 % (ref 36.0–46.0)
MCH: 33.6 pg (ref 26.0–34.0)
MCHC: 36.4 g/dL — ABNORMAL HIGH (ref 30.0–36.0)
Monocytes Absolute: 1.6 10*3/uL — ABNORMAL HIGH (ref 0.1–1.0)
Neutro Abs: 14.6 10*3/uL — ABNORMAL HIGH (ref 1.7–7.7)
Neutrophils Relative %: 78 % — ABNORMAL HIGH (ref 43–77)
RDW: 12.8 % (ref 11.5–15.5)

## 2013-01-19 LAB — URINALYSIS, ROUTINE W REFLEX MICROSCOPIC
Ketones, ur: >80 mg/dL — AB
Nitrite: NEGATIVE
Protein, ur: 100 mg/dL — AB

## 2013-01-19 MED ORDER — SODIUM CHLORIDE 0.9 % IV BOLUS (SEPSIS)
1000.0000 mL | Freq: Once | INTRAVENOUS | Status: AC
  Administered 2013-01-19: 1000 mL via INTRAVENOUS

## 2013-01-19 MED ORDER — FAMOTIDINE IN NACL 20-0.9 MG/50ML-% IV SOLN
20.0000 mg | Freq: Once | INTRAVENOUS | Status: DC
  Filled 2013-01-19: qty 50

## 2013-01-19 MED ORDER — PROMETHAZINE HCL 25 MG/ML IJ SOLN
12.5000 mg | Freq: Once | INTRAMUSCULAR | Status: AC
  Administered 2013-01-19: 12.5 mg via INTRAVENOUS
  Filled 2013-01-19: qty 1

## 2013-01-19 MED ORDER — POTASSIUM CHLORIDE 10 MEQ/100ML IV SOLN
10.0000 meq | INTRAVENOUS | Status: AC
  Administered 2013-01-20: 10 meq via INTRAVENOUS
  Filled 2013-01-19 (×2): qty 100

## 2013-01-19 NOTE — MAU Note (Addendum)
PT SAYS SHE STARTED A H/A   -    TOOK NO MED.     SAYS SHE STARTED VOMITING  ALL DAY- WHILE AT WORK.    NO DIARRHEA-   NO BM SINCE LAST WED. -- USUALLY GOES EVERYDAY.     SHE WAS HERE ON Monday- TOLD HAS A UTI- WAS TAKING MED-  BUT NOW CAN'T KEEP  DOWN.    USED PHENERGAN SUPP THIS AM    WAS SICK LIKE THIS  WITH LAST PREG- ENTIRE PREG-  HAD PIC LINE

## 2013-01-19 NOTE — MAU Note (Signed)
C/o N&V for a week and getting progressively worse; c/o headache and chills with weakness;;

## 2013-01-19 NOTE — MAU Provider Note (Signed)
History     CSN: [id number]  Arrival date and time: 01/19/13 2011   First Provider Initiated Contact with Patient 01/19/13 2152      Chief Complaint  Patient presents with  . Hyperemesis Gravidarum  . Urinary Tract Infection  . Headache   HPI [name] is a 27 y.o. female @ [redacted]w[redacted]d gestation who presents to MAU with nausea and vomiting and possible UTI. The nausea and vomiting started a week ago. She has been using Phenergan Supp. Without results. She was here x 2 last week for IV hydration. Was told she had a UTI and given antibiotics but unable to keep them down. Complains of back pain, frequency, dysuria and just feeling bad. The history was provided by the patient.  OB History    Grav Para Term Preterm Abortions TAB SAB Ect Mult Living   2 1 1       1       Past Medical History  Diagnosis Date  . Hyperemesis   . Acid reflux     Past Surgical History  Procedure Date  . Wisdom tooth extraction   . Peripherally inserted central catheter insertion     Family History  Problem Relation Age of Onset  . Hypertension Mother   . Depression Sister     History  Substance Use Topics  . Smoking status: Never Smoker   . Smokeless tobacco: Not on file  . Alcohol Use: No    Allergies:  Allergies  Allergen Reactions  . Cephalosporins Nausea And Vomiting and Rash  . Peanut-Containing Drug Products Other (See Comments)    Reaction unknown  . Zofran Nausea And Vomiting and Other (See Comments)    Chest pain and nose bleeds    Prescriptions prior to admission  Medication Sig Dispense Refill  . Doxylamine-Pyridoxine (DICLEGIS) 10-10 MG TBEC Take 1 tablet by mouth daily.      . nitrofurantoin, macrocrystal-monohydrate, (MACROBID) 100 MG capsule Take 1 capsule (100 mg total) by mouth 2 (two) times daily.  14 capsule  0  . potassium chloride (K-DUR) 10 MEQ tablet Take 1 tablet (10 mEq total) by mouth 3 (three) times daily.  14 tablet  0  . Prenatal Vit-Fe Fumarate-FA  (PRENATAL MULTIVITAMIN) TABS Take 1 tablet by mouth daily.      . promethazine (PHENERGAN) 12.5 MG tablet Take 1 tablet (12.5 mg total) by mouth every 6 (six) hours as needed for nausea.  30 tablet  0  . promethazine (PHENERGAN) 25 MG suppository Place 1 suppository (25 mg total) rectally every 6 (six) hours as needed for nausea.  30 each  0    Review of Systems  Constitutional: Positive for fever, chills and malaise/fatigue. Negative for weight loss.  HENT: Negative for ear pain, nosebleeds, congestion, sore throat and neck pain.   Eyes: Negative for blurred vision, double vision, photophobia and pain.  Respiratory: Positive for cough. Negative for shortness of breath and wheezing.   Cardiovascular: Negative for palpitations and leg swelling.  Gastrointestinal: Positive for heartburn, nausea, vomiting, abdominal pain and constipation. Negative for diarrhea.  Genitourinary: Positive for dysuria, urgency and frequency.  Musculoskeletal: Positive for back pain. Negative for myalgias.  Skin: Negative for itching and rash.  Neurological: Positive for dizziness and headaches. Negative for sensory change, speech change, seizures and weakness.  Endo/Heme/Allergies: Does not bruise/bleed easily.  Psychiatric/Behavioral: Negative for depression and substance abuse. The patient is not nervous/anxious and does not have insomnia.    Blood pressure 108/69, pulse 98, temperature  98.6 F (37 C), temperature source Oral, resp. rate 18, height 5\' 4"  (1.626 m), weight 208 lb 2 oz (94.405 kg), last menstrual period 11/11/2012, unknown if currently breastfeeding.  Physical Exam  Nursing note and vitals reviewed. Constitutional: She is oriented to person, place, and time. She appears well-developed and well-nourished. No distress.  HENT:  Head: Normocephalic and atraumatic.  Eyes: EOM are normal.  Neck: Neck supple.  Cardiovascular: Normal rate.   Respiratory: Effort normal.  GI: Soft. There is generalized  tenderness. There is no rebound, no guarding and no CVA tenderness.  Musculoskeletal: Normal range of motion.  Neurological: She is alert and oriented to person, place, and time.  Skin: Skin is warm and dry.  Psychiatric: She has a normal mood and affect. Her behavior is normal. Judgment and thought content normal.   Results for orders placed during the hospital encounter of 01/19/13 (from the past 24 hour(s))  URINALYSIS, ROUTINE W REFLEX MICROSCOPIC     Status: Abnormal   Collection Time   01/19/13  8:45 PM      Component Value Range   Color, Urine YELLOW  YELLOW   APPearance TURBID (*) CLEAR   Specific Gravity, Urine 1.020  1.005 - 1.030   pH 8.0  5.0 - 8.0   Glucose, UA 100 (*) NEGATIVE mg/dL   Hgb urine dipstick MODERATE (*) NEGATIVE   Bilirubin Urine LARGE (*) NEGATIVE   Ketones, ur >80 (*) NEGATIVE mg/dL   Protein, ur 119 (*) NEGATIVE mg/dL   Urobilinogen, UA 4.0 (*) 0.0 - 1.0 mg/dL   Nitrite NEGATIVE  NEGATIVE   Leukocytes, UA TRACE (*) NEGATIVE  URINE MICROSCOPIC-ADD ON     Status: Abnormal   Collection Time   01/19/13  8:45 PM      Component Value Range   Squamous Epithelial / LPF MANY (*) RARE   WBC, UA 3-6  <3 WBC/hpf   RBC / HPF 11-20  <3 RBC/hpf   Bacteria, UA MANY (*) RARE   Urine-Other MUCOUS PRESENT    CBC WITH DIFFERENTIAL     Status: Abnormal   Collection Time   01/19/13 10:00 PM      Component Value Range   WBC 18.7 (*) 4.0 - 10.5 K/uL   RBC 4.50  3.87 - 5.11 MIL/uL   Hemoglobin 15.1 (*) 12.0 - 15.0 g/dL   HCT 14.7  82.9 - 56.2 %   MCV 92.2  78.0 - 100.0 fL   MCH 33.6  26.0 - 34.0 pg   MCHC 36.4 (*) 30.0 - 36.0 g/dL   RDW 13.0  86.5 - 78.4 %   Platelets 310  150 - 400 K/uL   Neutrophils Relative 78 (*) 43 - 77 %   Neutro Abs 14.6 (*) 1.7 - 7.7 K/uL   Lymphocytes Relative 13  12 - 46 %   Lymphs Abs 2.4  0.7 - 4.0 K/uL   Monocytes Relative 9  3 - 12 %   Monocytes Absolute 1.6 (*) 0.1 - 1.0 K/uL   Eosinophils Relative 0  0 - 5 %   Eosinophils Absolute  0.0  0.0 - 0.7 K/uL   Basophils Relative 0  0 - 1 %   Basophils Absolute 0.0  0.0 - 0.1 K/uL  COMPREHENSIVE METABOLIC PANEL     Status: Abnormal   Collection Time   01/19/13 10:00 PM      Component Value Range   Sodium 137  135 - 145 mEq/L   Potassium 2.8 (*) 3.5 -  5.1 mEq/L   Chloride 93 (*) 96 - 112 mEq/L   CO2 27  19 - 32 mEq/L   Glucose, Bld 99  70 - 99 mg/dL   BUN 7  6 - 23 mg/dL   Creatinine, Ser 8.11  0.50 - 1.10 mg/dL   Calcium 91.4  8.4 - 78.2 mg/dL   Total Protein 7.8  6.0 - 8.3 g/dL   Albumin 4.3  3.5 - 5.2 g/dL   AST 69 (*) 0 - 37 U/L   ALT 130 (*) 0 - 35 U/L   Alkaline Phosphatase 83  39 - 117 U/L   Total Bilirubin 1.6 (*) 0.3 - 1.2 mg/dL   GFR calc non Af Amer >90  >90 mL/min   GFR calc Af Amer >90  >90 mL/min    23:15 Discussed with Dr. Ambrose Mantle and will continue IV hydration and observation and if improves will d/c home.  Send urine for culture. Procedures Assessment: Hyperemesis  Plan:  IV hydration   Antiemetics    Potassium IV  Josilynn Losh, RN, FNP, BC 01/20/2013, 1:13 AM   05:30 am Patient feeling better. RN called Dr. Ambrose Mantle with up date. Patient to follow up in the office.

## 2013-01-20 MED ORDER — PROMETHAZINE HCL 25 MG/ML IJ SOLN
12.5000 mg | Freq: Once | INTRAMUSCULAR | Status: AC
  Administered 2013-01-20: 12.5 mg via INTRAVENOUS
  Filled 2013-01-20: qty 1

## 2013-01-20 MED ORDER — DEXTROSE 5 % IN LACTATED RINGERS IV BOLUS
1000.0000 mL | Freq: Once | INTRAVENOUS | Status: AC
  Administered 2013-01-20: 1000 mL via INTRAVENOUS

## 2013-01-20 NOTE — MAU Note (Signed)
Plan of care discussed with H.Neese NP.IV D5LR infusing to at bolus rate. Pt also given an ice pack to  Hand for comfort at IV site. Plan to given 2nd Potassium dose after liter hanging

## 2013-01-20 NOTE — MAU Note (Signed)
Pt states nausea is better and has tolerated PO fluids

## 2013-01-20 NOTE — Progress Notes (Signed)
IV potassium rate decreased by Okey Regal [facility]

## 2013-01-20 NOTE — Progress Notes (Signed)
Introduced self to pt, informed her that we are waiting for urine results

## 2013-01-21 LAB — URINE CULTURE

## 2013-01-30 ENCOUNTER — Encounter (HOSPITAL_COMMUNITY): Payer: Self-pay | Admitting: Advanced Practice Midwife

## 2013-01-30 ENCOUNTER — Inpatient Hospital Stay (HOSPITAL_COMMUNITY)
Admission: AD | Admit: 2013-01-30 | Discharge: 2013-01-30 | Disposition: A | Discharge status: Home / Self Care | Payer: BC Managed Care – PPO | Source: Ambulatory Visit | Attending: Obstetrics and Gynecology | Admitting: Obstetrics and Gynecology

## 2013-01-30 DIAGNOSIS — O210 Mild hyperemesis gravidarum: Secondary | ICD-10-CM

## 2013-01-30 DIAGNOSIS — O219 Vomiting of pregnancy, unspecified: Secondary | ICD-10-CM

## 2013-01-30 LAB — COMPREHENSIVE METABOLIC PANEL
ALT: 40 U/L — ABNORMAL HIGH (ref 0–35)
Albumin: 3.6 g/dL (ref 3.5–5.2)
Alkaline Phosphatase: 58 U/L (ref 39–117)
Calcium: 9.4 mg/dL (ref 8.4–10.5)
GFR calc Af Amer: >90 mL/min (ref >90)
Glucose, Bld: 87 mg/dL (ref 70–99)
Potassium: 3.5 mEq/L (ref 3.5–5.1)
Sodium: 136 mEq/L (ref 135–145)
Total Protein: 7.1 g/dL (ref 6.0–8.3)

## 2013-01-30 LAB — URINALYSIS, ROUTINE W REFLEX MICROSCOPIC
Glucose, UA: NEGATIVE mg/dL
Hgb urine dipstick: NEGATIVE
Leukocytes, UA: NEGATIVE
Protein, ur: 30 mg/dL — AB
Specific Gravity, Urine: 1.02 (ref 1.005–1.030)
pH: 8 (ref 5.0–8.0)

## 2013-01-30 LAB — CBC
Hemoglobin: 13 g/dL (ref 12.0–15.0)
MCH: 33.7 pg (ref 26.0–34.0)
MCHC: 35.1 g/dL (ref 30.0–36.0)
RDW: 13.6 % (ref 11.5–15.5)

## 2013-01-30 LAB — URINE MICROSCOPIC-ADD ON

## 2013-01-30 MED ORDER — DEXTROSE 5 % IN LACTATED RINGERS IV BOLUS
1000.0000 mL | Freq: Once | INTRAVENOUS | Status: AC
  Administered 2013-01-30: 1000 mL via INTRAVENOUS

## 2013-01-30 MED ORDER — PANTOPRAZOLE SODIUM 20 MG PO TBEC: 20.0000 mg | DELAYED_RELEASE_TABLET | Freq: Every day | ORAL | Status: DC

## 2013-01-30 MED ORDER — METOCLOPRAMIDE HCL 10 MG PO TABS: 10.0000 mg | ORAL_TABLET | Freq: Four times a day (QID) | ORAL | Status: DC

## 2013-01-30 MED ORDER — PROMETHAZINE HCL 25 MG/ML IJ SOLN
25.0000 mg | Freq: Once | INTRAMUSCULAR | Status: AC
  Administered 2013-01-30: 25 mg via INTRAVENOUS
  Filled 2013-01-30: qty 1

## 2013-01-30 NOTE — MAU Provider Note (Signed)
History     CSN: [id number]  Arrival date and time: 01/30/13 1451   First Provider Initiated Contact with Patient 01/30/13 1625      Chief Complaint  Patient presents with  . Emesis   HPI 27 y.o. G2P1001 at [redacted]w[redacted]d with nausea and vomiting ongoing throughout pregnancy, worse since 9 weeks, constant since last night. Took phenergan suppository at 11 PM last night, hasn't taken today because she had to go to work. Vomiting continued after phenergan and all day today. Unable to keep food or drink down today. Phenergan helps sometimes, but not always. States diclegis doesn't work, zofran causes chest pain, took reglan in prior pregnancy, did not help then, hasn't taken this pregnancy. Has history of acid reflux, having some symptoms now.    Past Medical History  Diagnosis Date  . Hyperemesis   . Acid reflux     Past Surgical History  Procedure Laterality Date  . Wisdom tooth extraction    . Peripherally inserted central catheter insertion      Family History  Problem Relation Age of Onset  . Hypertension Mother   . Depression Sister     History  Substance Use Topics  . Smoking status: Never Smoker   . Smokeless tobacco: Not on file  . Alcohol Use: No    Allergies:  Allergies  Allergen Reactions  . Cephalosporins Nausea And Vomiting and Rash  . Peanut-Containing Drug Products Other (See Comments)    Reaction unknown  . Zofran Nausea And Vomiting and Other (See Comments)    Chest pain and nose bleeds    Prescriptions prior to admission  Medication Sig Dispense Refill  . acetaminophen (TYLENOL) 500 MG tablet Take 1,000 mg by mouth every 6 (six) hours as needed for pain.      . Prenatal Vit-Fe Fumarate-FA (PRENATAL MULTIVITAMIN) TABS Take 1 tablet by mouth daily.      . promethazine (PHENERGAN) 25 MG suppository Place 1 suppository (25 mg total) rectally every 6 (six) hours as needed for nausea.  30 each  0    Review of Systems  Constitutional: Negative.    Respiratory: Negative.   Cardiovascular: Negative.   Gastrointestinal: Positive for heartburn, nausea, vomiting and abdominal pain. Negative for diarrhea and constipation.  Genitourinary: Negative for dysuria, urgency, frequency, hematuria and flank pain.       Negative for vaginal bleeding, vaginal discharge, dyspareunia  Musculoskeletal: Negative.   Neurological: Negative.   Psychiatric/Behavioral: Negative.    Physical Exam   Blood pressure 116/71, pulse 108, temperature 97.8 F (36.6 C), temperature source Oral, resp. rate 18, height 5' 3.5" (1.613 m), weight 210 lb 12.8 oz (95.618 kg), last menstrual period 11/11/2012, SpO2 100.00%, unknown if currently breastfeeding.  Physical Exam  Nursing note and vitals reviewed. Constitutional: She is oriented to person, place, and time. She appears well-developed and well-nourished. No distress.  Cardiovascular: Normal rate.   Respiratory: Effort normal.  GI: Soft. She exhibits no distension and no mass. There is tenderness (diffuse). There is no rebound and no guarding.  Musculoskeletal: Normal range of motion.  Neurological: She is alert and oriented to person, place, and time.  Skin: Skin is warm and dry.  Psychiatric: She has a normal mood and affect.    MAU Course  Procedures  Results for orders placed during the hospital encounter of 01/30/13 (from the past 24 hour(s))  URINALYSIS, ROUTINE W REFLEX MICROSCOPIC     Status: Abnormal   Collection Time    01/30/13  3:35  PM      Result Value Range   Color, Urine YELLOW  YELLOW   APPearance CLEAR  CLEAR   Specific Gravity, Urine 1.020  1.005 - 1.030   pH 8.0  5.0 - 8.0   Glucose, UA NEGATIVE  NEGATIVE mg/dL   Hgb urine dipstick NEGATIVE  NEGATIVE   Bilirubin Urine NEGATIVE  NEGATIVE   Ketones, ur >80 (*) NEGATIVE mg/dL   Protein, ur 30 (*) NEGATIVE mg/dL   Urobilinogen, UA 0.2  0.0 - 1.0 mg/dL   Nitrite NEGATIVE  NEGATIVE   Leukocytes, UA NEGATIVE  NEGATIVE  URINE  MICROSCOPIC-ADD ON     Status: None   Collection Time    01/30/13  3:35 PM      Result Value Range   Squamous Epithelial / LPF RARE  RARE   WBC, UA 0-2  <3 WBC/hpf   RBC / HPF 0-2  <3 RBC/hpf   Bacteria, UA RARE  RARE   Urine-Other MUCOUS PRESENT    CBC     Status: Abnormal   Collection Time    01/30/13  4:22 PM      Result Value Range   WBC 17.8 (*) 4.0 - 10.5 K/uL   RBC 3.86 (*) 3.87 - 5.11 MIL/uL   Hemoglobin 13.0  12.0 - 15.0 g/dL   HCT 09.8  11.9 - 14.7 %   MCV 95.9  78.0 - 100.0 fL   MCH 33.7  26.0 - 34.0 pg   MCHC 35.1  30.0 - 36.0 g/dL   RDW 82.9  56.2 - 13.0 %   Platelets 331  150 - 400 K/uL  COMPREHENSIVE METABOLIC PANEL     Status: Abnormal   Collection Time    01/30/13  4:22 PM      Result Value Range   Sodium 136  135 - 145 mEq/L   Potassium 3.5  3.5 - 5.1 mEq/L   Chloride 101  96 - 112 mEq/L   CO2 22  19 - 32 mEq/L   Glucose, Bld 87  70 - 99 mg/dL   BUN 6  6 - 23 mg/dL   Creatinine, Ser 8.65  0.50 - 1.10 mg/dL   Calcium 9.4  8.4 - 78.4 mg/dL   Total Protein 7.1  6.0 - 8.3 g/dL   Albumin 3.6  3.5 - 5.2 g/dL   AST 19  0 - 37 U/L   ALT 40 (*) 0 - 35 U/L   Alkaline Phosphatase 58  39 - 117 U/L   Total Bilirubin 0.4  0.3 - 1.2 mg/dL   GFR calc non Af Amer >90  >90 mL/min   GFR calc Af Amer >90  >90 mL/min   D5LR and Phenergan 25 mg IV, feeling somewhat better  Assessment and Plan  27 y.o. G2P1001 at [redacted]w[redacted]d with n/v in pregnancy Continue Phenergan PRN, Rx Reglan 10 mg po qid PRN and Protonix  NPO until tomorrow morning, then clear liquids, advance slowly as tolerated F/U as scheduled or sooner as needed  Marjani Kobel 01/30/2013, 4:25 PM

## 2013-01-30 NOTE — MAU Note (Signed)
Throwing up non- stop since last night.  Today after throwing up has been having a hard time catching her breath.  Noted blood (bright red blobs) in emesis today- all day.

## 2013-01-30 NOTE — MAU Note (Signed)
Patient is in with c/o n/v since last night. Patient states that she was tolerating food/water since 2/6 but started vomiting last night. She states that she only takes phenergan suppository because that is only anti-emetic that work. She c/o bilateral side pain. She denies vaginal bleeding.

## 2013-01-30 NOTE — MAU Note (Signed)
Lips and hands dry

## 2013-02-01 ENCOUNTER — Encounter (HOSPITAL_COMMUNITY): Payer: Self-pay | Admitting: *Deleted

## 2013-02-01 ENCOUNTER — Observation Stay (HOSPITAL_COMMUNITY)
Admission: AD | Admit: 2013-02-01 | Discharge: 2013-02-03 | Disposition: A | Discharge status: Home / Self Care | Payer: BC Managed Care – PPO | Source: Ambulatory Visit | Attending: Obstetrics and Gynecology | Admitting: Obstetrics and Gynecology

## 2013-02-01 DIAGNOSIS — O210 Mild hyperemesis gravidarum: Principal | ICD-10-CM | POA: Insufficient documentation

## 2013-02-01 DIAGNOSIS — R7989 Other specified abnormal findings of blood chemistry: Secondary | ICD-10-CM | POA: Insufficient documentation

## 2013-02-01 DIAGNOSIS — O211 Hyperemesis gravidarum with metabolic disturbance: Secondary | ICD-10-CM

## 2013-02-01 LAB — URINALYSIS, ROUTINE W REFLEX MICROSCOPIC
Glucose, UA: NEGATIVE mg/dL
Protein, ur: 100 mg/dL — AB

## 2013-02-01 LAB — COMPREHENSIVE METABOLIC PANEL
BUN: 6 mg/dL (ref 6–23)
Calcium: 9.7 mg/dL (ref 8.4–10.5)
GFR calc Af Amer: >90 mL/min (ref >90)
Glucose, Bld: 105 mg/dL — ABNORMAL HIGH (ref 70–99)
Total Protein: 7.2 g/dL (ref 6.0–8.3)

## 2013-02-01 LAB — CBC WITH DIFFERENTIAL/PLATELET
Basophils Absolute: 0 10*3/uL (ref 0.0–0.1)
Eosinophils Relative: 0 % (ref 0–5)
HCT: 39.6 % (ref 36.0–46.0)
Lymphocytes Relative: 13 % (ref 12–46)
Lymphs Abs: 2.3 10*3/uL (ref 0.7–4.0)
MCH: 33.2 pg (ref 26.0–34.0)
MCV: 94.5 fL (ref 78.0–100.0)
Monocytes Absolute: 1.3 10*3/uL — ABNORMAL HIGH (ref 0.1–1.0)
RDW: 13.6 % (ref 11.5–15.5)
WBC: 17.6 10*3/uL — ABNORMAL HIGH (ref 4.0–10.5)

## 2013-02-01 LAB — URINE MICROSCOPIC-ADD ON

## 2013-02-01 MED ORDER — SODIUM CHLORIDE 0.9 % IJ SOLN
INTRAMUSCULAR | Status: AC
  Filled 2013-02-01: qty 3

## 2013-02-01 MED ORDER — PRENATAL MULTIVITAMIN CH: 1.0000 | ORAL_TABLET | Freq: Every day | ORAL | Status: DC

## 2013-02-01 MED ORDER — ACETAMINOPHEN 325 MG PO TABS: 650.0000 mg | ORAL_TABLET | ORAL | Status: DC | PRN

## 2013-02-01 MED ORDER — CALCIUM CARBONATE ANTACID 500 MG PO CHEW: 2.0000 | CHEWABLE_TABLET | ORAL | Status: DC | PRN

## 2013-02-01 MED ORDER — DEXTROSE IN LACTATED RINGERS 5 % IV SOLN
25.0000 mg | Freq: Once | INTRAVENOUS | Status: AC
  Administered 2013-02-01: 25 mg via INTRAVENOUS
  Filled 2013-02-01: qty 1

## 2013-02-01 MED ORDER — PROMETHAZINE HCL 25 MG/ML IJ SOLN
25.0000 mg | INTRAMUSCULAR | Status: DC | PRN
  Administered 2013-02-02 (×2): 25 mg via INTRAVENOUS
  Filled 2013-02-01 (×2): qty 1

## 2013-02-01 MED ORDER — DEXTROSE IN LACTATED RINGERS 5 % IV SOLN
INTRAVENOUS | Status: DC
  Administered 2013-02-02 – 2013-02-03 (×5): via INTRAVENOUS

## 2013-02-01 MED ORDER — ZOLPIDEM TARTRATE 5 MG PO TABS: 5.0000 mg | ORAL_TABLET | Freq: Every evening | ORAL | Status: DC | PRN

## 2013-02-01 NOTE — MAU Note (Signed)
Pt reports she has not been able to keep anything down, states she was here 2 days ago and still is unable to eat or drink. Has been using suppositories and has been keeping reglan down.

## 2013-02-01 NOTE — MAU Provider Note (Signed)
Chief Complaint: Hyperemesis Gravidarum   First Provider Initiated Contact with Patient 02/01/13 2147     SUBJECTIVE HPI: [name] is a 27 y.o. G2P1001 at [redacted]w[redacted]d by LMP who presents with N/V throughout this prepgnancy. Not able to keep anything down since MAU visit 2 days ago. 5th visit to MAU for similar Sx. Taking Phenergan suppositories, Reglan PO w/ little improvement. Diclegis doesn't help. Severe hyperemesis w/ PICC line last pregnancy. Mildly elevated AST, ASL and hypokalemia at previous visits this pregnancy. Normal except for ALT 40 at last visit.   Past Medical History  Diagnosis Date  . Hyperemesis   . Acid reflux        OB History   Grav Para Term Preterm Abortions TAB SAB Ect Mult Living   2 1 1       1      # Outc Date GA Lbr Len/2nd Wgt Sex Del Anes PTL Lv   1 TRM 10/12 [redacted]w[redacted]d 00:20 / 00:32 2.565kg(5lb10.5oz) F SVD None  Yes   2 CUR              Past Surgical History  Procedure Laterality Date  . Wisdom tooth extraction    . Peripherally inserted central catheter insertion     History   Social History  . Marital Status: Married    Spouse Name: N/A    Number of Children: N/A  . Years of Education: N/A   Occupational History  . Not on file.   Social History Main Topics  . Smoking status: Never Smoker   . Smokeless tobacco: Not on file  . Alcohol Use: No  . Drug Use: No  . Sexually Active: Yes    Birth Control/ Protection: None   Other Topics Concern  . Not on file   Social History Narrative  . No narrative on file   No current facility-administered medications on file prior to encounter.   Current Outpatient Prescriptions on File Prior to Encounter  Medication Sig Dispense Refill  . acetaminophen (TYLENOL) 500 MG tablet Take 1,000 mg by mouth every 6 (six) hours as needed for pain. migrain      . metoCLOPramide (REGLAN) 10 MG tablet Take 1 tablet (10 mg total) by mouth 4 (four) times daily.  60 tablet  1  . pantoprazole (PROTONIX) 20 MG  tablet Take 1 tablet (20 mg total) by mouth daily.  30 tablet  0  . Prenatal Vit-Fe Fumarate-FA (PRENATAL MULTIVITAMIN) TABS Take 1 tablet by mouth daily.      . promethazine (PHENERGAN) 25 MG suppository Place 1 suppository (25 mg total) rectally every 6 (six) hours as needed for nausea.  30 each  0   Allergies  Allergen Reactions  . Cephalosporins Nausea And Vomiting and Rash  . Peanut-Containing Drug Products Other (See Comments)    Reaction unknown  . Zofran Nausea And Vomiting and Other (See Comments)    Chest pain and nose bleeds    ROS: Pos cramping on right rib area since arrival to MAU. Neg for fever, chills, abd pain, VB, diarrhea, sick contacts, urinary complaints.    OBJECTIVE Blood pressure 127/80, pulse 124, temperature 98.2 F (36.8 C), temperature source Oral, resp. rate 20, height 5\' 6"  (1.676 m), weight 93.895 kg (207 lb), last menstrual period 11/11/2012, SpO2 100.00%. GENERAL: Well-developed, well-nourished female in mild distress. Weak-appearing.  HEENT: Normocephalic. Mucus membranes dry.  HEART: Tachycardic RESP: normal effort ABDOMEN: Soft, non-tender EXTREMITIES: Nontender, no edema.  NEURO: Alert and oriented SPECULUM EXAM:  Deferred FHR 160 by doppler  LAB RESULTS Results for orders placed during the hospital encounter of 02/01/13 (from the past 24 hour(s))  URINALYSIS, ROUTINE W REFLEX MICROSCOPIC     Status: Abnormal   Collection Time    02/01/13  8:45 PM      Result Value Range   Color, Urine YELLOW  YELLOW   APPearance CLEAR  CLEAR   Specific Gravity, Urine >1.030 (*) 1.005 - 1.030   pH 6.0  5.0 - 8.0   Glucose, UA NEGATIVE  NEGATIVE mg/dL   Hgb urine dipstick TRACE (*) NEGATIVE   Bilirubin Urine SMALL (*) NEGATIVE   Ketones, ur >80 (*) NEGATIVE mg/dL   Protein, ur 782 (*) NEGATIVE mg/dL   Urobilinogen, UA 1.0  0.0 - 1.0 mg/dL   Nitrite NEGATIVE  NEGATIVE   Leukocytes, UA NEGATIVE  NEGATIVE  URINE MICROSCOPIC-ADD ON     Status: Abnormal    Collection Time    02/01/13  8:45 PM      Result Value Range   Squamous Epithelial / LPF MANY (*) RARE   WBC, UA 3-6  <3 WBC/hpf   RBC / HPF 3-6  <3 RBC/hpf   Bacteria, UA MANY (*) RARE  CBC WITH DIFFERENTIAL     Status: Abnormal   Collection Time    02/01/13  8:55 PM      Result Value Range   WBC 17.6 (*) 4.0 - 10.5 K/uL   RBC 4.19  3.87 - 5.11 MIL/uL   Hemoglobin 13.9  12.0 - 15.0 g/dL   HCT 95.6  21.3 - 08.6 %   MCV 94.5  78.0 - 100.0 fL   MCH 33.2  26.0 - 34.0 pg   MCHC 35.1  30.0 - 36.0 g/dL   RDW 57.8  46.9 - 62.9 %   Platelets 354  150 - 400 K/uL   Neutrophils Relative 80 (*) 43 - 77 %   Neutro Abs 14.1 (*) 1.7 - 7.7 K/uL   Lymphocytes Relative 13  12 - 46 %   Lymphs Abs 2.3  0.7 - 4.0 K/uL   Monocytes Relative 7  3 - 12 %   Monocytes Absolute 1.3 (*) 0.1 - 1.0 K/uL   Eosinophils Relative 0  0 - 5 %   Eosinophils Absolute 0.1  0.0 - 0.7 K/uL   Basophils Relative 0  0 - 1 %   Basophils Absolute 0.0  0.0 - 0.1 K/uL  COMPREHENSIVE METABOLIC PANEL     Status: Abnormal   Collection Time    02/01/13  8:55 PM      Result Value Range   Sodium 137  135 - 145 mEq/L   Potassium 3.1 (*) 3.5 - 5.1 mEq/L   Chloride 102  96 - 112 mEq/L   CO2 19  19 - 32 mEq/L   Glucose, Bld 105 (*) 70 - 99 mg/dL   BUN 6  6 - 23 mg/dL   Creatinine, Ser 5.28 (*) 0.50 - 1.10 mg/dL   Calcium 9.7  8.4 - 41.3 mg/dL   Total Protein 7.2  6.0 - 8.3 g/dL   Albumin 3.9  3.5 - 5.2 g/dL   AST 24  0 - 37 U/L   ALT 41 (*) 0 - 35 U/L   Alkaline Phosphatase 65  39 - 117 U/L   Total Bilirubin 1.0  0.3 - 1.2 mg/dL   GFR calc non Af Amer >90  >90 mL/min   GFR calc Af Amer >90  >90  mL/min    IMAGING No results found.  MAU COURSE 2205: Discussed exam and Hx w/ Dr. Jackelyn Knife. Will give IV phenergan in D5LR and call back w/ CMET results.  2340: Pt reports vomitin g~ 10 x since arrival to MAU. Stopped w/ phenergan, but nausea persists.   ASSESSMENT 1. Hyperemesis gravidarum to [redacted] weeks gestation,  electrolyte imbalance     PLAN Admit to Women's Unit per Dr. Jackelyn Knife.  [place], CNM 02/01/2013  11:42 PM

## 2013-02-02 MED ORDER — METOCLOPRAMIDE HCL 5 MG/ML IJ SOLN
10.0000 mg | Freq: Four times a day (QID) | INTRAMUSCULAR | Status: DC
  Administered 2013-02-02 – 2013-02-03 (×5): 10 mg via INTRAVENOUS
  Filled 2013-02-02 (×5): qty 2

## 2013-02-02 NOTE — H&P (Signed)
SUBJECTIVE HPI: [name] is a 27 y.o. G2P1001 at [redacted]w[redacted]d by LMP who presents with N/V throughout this prepgnancy. Not able to keep anything down since MAU visit 2 days ago. 5th visit to MAU for similar Sx. Taking Phenergan suppositories, Reglan PO w/ little improvement. Diclegis doesn't help. Severe hyperemesis w/ PICC line last pregnancy. Mildly elevated AST, ASL and hypokalemia at previous visits this pregnancy. Normal except for ALT 40 at last visit.     Past Medical History   Diagnosis  Date   .  Hyperemesis     .  Acid reflux            OB History     Grav  Para  Term  Preterm  Abortions  TAB  SAB  Ect  Mult  Living     2  1  1              1         #  Outc  Date  GA  Lbr Len/2nd  Wgt  Sex  Del  Anes  PTL  Lv     1  TRM  10/12  [redacted]w[redacted]d  00:20 / 00:32  2.565kg(5lb10.5oz)  F  SVD  None    Yes     2  CUR                         Past Surgical History   Procedure  Laterality  Date   .  Wisdom tooth extraction       .  Peripherally inserted central catheter insertion        History       Social History   .  Marital Status:  Married       Spouse Name:  N/A       Number of Children:  N/A   .  Years of Education:  N/A       Occupational History   .  Not on file.       Social History Main Topics   .  Smoking status:  Never Smoker    .  Smokeless tobacco:  Not on file   .  Alcohol Use:  No   .  Drug Use:  No   .  Sexually Active:  Yes       Birth Control/ Protection:  None       Other Topics  Concern   .  Not on file       Social History Narrative   .  No narrative on file    No current facility-administered medications on file prior to encounter.       Current Outpatient Prescriptions on File Prior to Encounter   Medication  Sig  Dispense  Refill   .  acetaminophen (TYLENOL) 500 MG tablet  Take 1,000 mg by mouth every 6 (six) hours as needed for pain. migrain         .  metoCLOPramide (REGLAN) 10 MG tablet  Take 1 tablet (10 mg total) by mouth 4 (four)  times daily.   60 tablet   1   .  pantoprazole (PROTONIX) 20 MG tablet  Take 1 tablet (20 mg total) by mouth daily.   30 tablet   0   .  Prenatal Vit-Fe Fumarate-FA (PRENATAL MULTIVITAMIN) TABS  Take 1 tablet by mouth daily.         .  promethazine (PHENERGAN) 25 MG  suppository  Place 1 suppository (25 mg total) rectally every 6 (six) hours as needed for nausea.   30 each   0    Allergies   Allergen  Reactions   .  Cephalosporins  Nausea And Vomiting and Rash   .  Peanut-Containing Drug Products  Other (See Comments)       Reaction unknown   .  Zofran  Nausea And Vomiting and Other (See Comments)       Chest pain and nose bleeds      ROS: Pos cramping on right rib area since arrival to MAU. Neg for fever, chills, abd pain, VB, diarrhea, sick contacts, urinary complaints.      OBJECTIVE Blood pressure 127/80, pulse 124, temperature 98.2 F (36.8 C), temperature source Oral, resp. rate 20, height 5\' 6"  (1.676 m), weight 93.895 kg (207 lb), last menstrual period 11/11/2012, SpO2 100.00%. GENERAL: Well-developed, well-nourished female in mild distress. Weak-appearing.   HEENT: Normocephalic. Mucus membranes dry.   HEART: Tachycardic RESP: normal effort ABDOMEN: Soft, non-tender EXTREMITIES: Nontender, no edema.   NEURO: Alert and oriented SPECULUM EXAM: Deferred FHR 160 by doppler   LAB RESULTS Results for orders placed during the hospital encounter of 02/01/13 (from the past 24 hour(s))   URINALYSIS, ROUTINE W REFLEX MICROSCOPIC     Status: Abnormal     Collection Time      02/01/13  8:45 PM       Result  Value  Range     Color, Urine  YELLOW   YELLOW     APPearance  CLEAR   CLEAR     Specific Gravity, Urine  >1.030 (*)  1.005 - 1.030     pH  6.0   5.0 - 8.0     Glucose, UA  NEGATIVE   NEGATIVE mg/dL     Hgb urine dipstick  TRACE (*)  NEGATIVE     Bilirubin Urine  SMALL (*)  NEGATIVE     Ketones, ur  >80 (*)  NEGATIVE mg/dL     Protein, ur  960 (*)  NEGATIVE mg/dL      Urobilinogen, UA  1.0   0.0 - 1.0 mg/dL     Nitrite  NEGATIVE   NEGATIVE     Leukocytes, UA  NEGATIVE   NEGATIVE   URINE MICROSCOPIC-ADD ON     Status: Abnormal     Collection Time      02/01/13  8:45 PM       Result  Value  Range     Squamous Epithelial / LPF  MANY (*)  RARE     WBC, UA  3-6   <3 WBC/hpf     RBC / HPF  3-6   <3 RBC/hpf     Bacteria, UA  MANY (*)  RARE   CBC WITH DIFFERENTIAL     Status: Abnormal     Collection Time      02/01/13  8:55 PM       Result  Value  Range     WBC  17.6 (*)  4.0 - 10.5 K/uL     RBC  4.19   3.87 - 5.11 MIL/uL     Hemoglobin  13.9   12.0 - 15.0 g/dL     HCT  45.4   09.8 - 46.0 %     MCV  94.5   78.0 - 100.0 fL     MCH  33.2   26.0 - 34.0 pg  MCHC  35.1   30.0 - 36.0 g/dL     RDW  16.1   09.6 - 15.5 %     Platelets  354   150 - 400 K/uL     Neutrophils Relative  80 (*)  43 - 77 %     Neutro Abs  14.1 (*)  1.7 - 7.7 K/uL     Lymphocytes Relative  13   12 - 46 %     Lymphs Abs  2.3   0.7 - 4.0 K/uL     Monocytes Relative  7   3 - 12 %     Monocytes Absolute  1.3 (*)  0.1 - 1.0 K/uL     Eosinophils Relative  0   0 - 5 %     Eosinophils Absolute  0.1   0.0 - 0.7 K/uL     Basophils Relative  0   0 - 1 %     Basophils Absolute  0.0   0.0 - 0.1 K/uL   COMPREHENSIVE METABOLIC PANEL     Status: Abnormal     Collection Time      02/01/13  8:55 PM       Result  Value  Range     Sodium  137   135 - 145 mEq/L     Potassium  3.1 (*)  3.5 - 5.1 mEq/L     Chloride  102   96 - 112 mEq/L     CO2  19   19 - 32 mEq/L     Glucose, Bld  105 (*)  70 - 99 mg/dL     BUN  6   6 - 23 mg/dL     Creatinine, Ser  0.45 (*)  0.50 - 1.10 mg/dL     Calcium  9.7   8.4 - 10.5 mg/dL     Total Protein  7.2   6.0 - 8.3 g/dL     Albumin  3.9   3.5 - 5.2 g/dL     AST  24   0 - 37 U/L     ALT  41 (*)  0 - 35 U/L     Alkaline Phosphatase  65   39 - 117 U/L     Total Bilirubin  1.0   0.3 - 1.2 mg/dL     GFR calc non Af Amer  >90   >90 mL/min     GFR calc Af Amer   >90   >90 mL/min      IMAGING No results found.   MAU COURSE 2205: Discussed exam and Hx w/ Dr. Jackelyn Knife. Will give IV phenergan in D5LR and call back w/ CMET results.   2340: Pt reports vomitin g~ 10 x since arrival to MAU. Stopped w/ phenergan, but nausea persists.    ASSESSMENT 1.  Hyperemesis gravidarum at [redacted] weeks gestation, electrolyte imbalance       PLAN Admit to Women's Unit per Dr. Jackelyn Knife. Dorathy Kinsman, CNM  Pt admitted late last pm with refractory nausea and vomiting.  Overnight getting D5LR and Phenergan, has vomited 4 more times.  Will continue IV fluids, change Phenergan to scheduled Reglan, recheck CMP tomorrow am and monitor progress.  Needed PICC line with last pregnancy.

## 2013-02-03 LAB — URINE CULTURE: Colony Count: >=100000

## 2013-02-03 LAB — COMPREHENSIVE METABOLIC PANEL
ALT: 33 U/L (ref 0–35)
AST: 21 U/L (ref 0–37)
CO2: 19 mEq/L (ref 19–32)
Calcium: 8.6 mg/dL (ref 8.4–10.5)
Chloride: 104 mEq/L (ref 96–112)
GFR calc non Af Amer: >90 mL/min (ref >90)
Sodium: 134 mEq/L — ABNORMAL LOW (ref 135–145)
Total Bilirubin: 0.5 mg/dL (ref 0.3–1.2)

## 2013-02-03 MED ORDER — POTASSIUM CHLORIDE ER 10 MEQ PO TBCR: 20.0000 meq | EXTENDED_RELEASE_TABLET | Freq: Two times a day (BID) | ORAL | Status: DC

## 2013-02-03 NOTE — Progress Notes (Signed)
Pt discharged to home with husband.  Condition stable.  Pt ambulated off unit with husband to go home.  Pt and husband wanted to go themselves rather than wait for another staff person to return to floor so that RN could accompany them to front door. No equipment for home ordered at discharge.

## 2013-02-03 NOTE — Discharge Summary (Signed)
Cindy, [name]             ACCOUNT NO.:  [id number]  MEDICAL RECORD NO.:  [id number]  LOCATION:  9305                          FACILITY:  [facility]  PHYSICIAN:  Malachi Pro. Ambrose Mantle, M.D. DATE OF BIRTH:  10/26/1986  DATE OF ADMISSION:  02/01/2013 DATE OF DISCHARGE:  02/03/2013                              DISCHARGE SUMMARY   HISTORY OF PRESENT ILLNESS:  This is a 27 year old black female, para 1- 0-0-1, gravida 2 at 11 weeks and 5 days gestation who was admitted on February 02, 2013 with persistent nausea and vomiting.  She had been to the maternity admission unit on several occasions during the pregnancy. This was her fifth visit for the same problem.  Her ALT had been 40 at her last visit.  Her first pregnancy was complicated by the same symptoms and it persisted throughout pregnancy and eventually led to a PICC line insertion.  Laboratory data on admission showed a specific gravity of the urine greater than 1.030, urine ketones greater than 80 mg%, white cells and red cells were less than 3 in the urine.  CBC, white count was 17,600, hemoglobin 13.9, hematocrit 36.0, platelets 354, 80 neutrophils, 13 lymphs, 7 monocytes.  Sodium 137, potassium 3.1, chloride 102, CO2 19, glucose was 105, BUN 6, creatinine 0.49, calcium 9.7, protein 7.2.  AST 24, ALT 41, total bilirubin 1.0.  Estimated glomerular filtration rate greater than 90 mL a minute.  The patient was given IV Phenergan.  She was treated in the hospital with Phenergan suppositories, Reglan and Protonix and the last 3 meals she has been able to tolerate without vomiting.  Her last Chem 12 showed ALT of 33, AST of 21, potassium of 3.1.  During the hospitalization, her ALT was 40 and 41 and 33, AST 19, 24 and 21, potassium 3.5, 3.1, and 3.1.  DISCHARGE DIAGNOSES:  Intrauterine pregnancy at 11 weeks, hyperemesis gravidarum, mild elevation of liver function tests.  OPERATIONS:  None.  FINAL CONDITION:  Improved.  INSTRUCTIONS:   Report persistent for recurrent vomiting.  Return to the office in 1 week.  I am going to order her to continue the Phenergan suppositories 25 mg q.6 hours as needed, Reglan 10 mg 4 times a day, her antireflux medication as directed, and K-Dur 20 mEq twice a day.     Malachi Pro. Ambrose Mantle, M.D.     TFH/MEDQ  D:  02/03/2013  T:  02/03/2013  Job:  [id number]

## 2013-02-03 NOTE — Progress Notes (Signed)
Patient ID: [name], female   DOB: Apr 20, 1986, 27 y.o.   MRN: [id number] Pt has tolerated her last 3 meals and is ready for d/c. Her husband wants her to have a PICC line but I have instructed him that there is no indication for that at this time. He states he knows that the process is going to repeat itself The pt is instructed to call her insurance co and let me know her in home provider so if the process does repeat itself that arrangements can be made for in home IV therapy.

## 2013-02-06 ENCOUNTER — Inpatient Hospital Stay (HOSPITAL_COMMUNITY)
Admission: AD | Admit: 2013-02-06 | Discharge: 2013-02-06 | Disposition: A | Discharge status: Home / Self Care | Payer: BC Managed Care – PPO | Source: Ambulatory Visit | Attending: Obstetrics and Gynecology | Admitting: Obstetrics and Gynecology

## 2013-02-06 DIAGNOSIS — O211 Hyperemesis gravidarum with metabolic disturbance: Secondary | ICD-10-CM | POA: Insufficient documentation

## 2013-02-06 DIAGNOSIS — O210 Mild hyperemesis gravidarum: Secondary | ICD-10-CM

## 2013-02-06 DIAGNOSIS — O219 Vomiting of pregnancy, unspecified: Secondary | ICD-10-CM

## 2013-02-06 DIAGNOSIS — E860 Dehydration: Secondary | ICD-10-CM | POA: Insufficient documentation

## 2013-02-06 LAB — COMPREHENSIVE METABOLIC PANEL
ALT: 28 U/L (ref 0–35)
AST: 16 U/L (ref 0–37)
Albumin: 3.7 g/dL (ref 3.5–5.2)
Calcium: 9.9 mg/dL (ref 8.4–10.5)
Creatinine, Ser: 0.56 mg/dL (ref 0.50–1.10)
Sodium: 137 mEq/L (ref 135–145)
Total Protein: 7 g/dL (ref 6.0–8.3)

## 2013-02-06 LAB — URINALYSIS, ROUTINE W REFLEX MICROSCOPIC
Bilirubin Urine: NEGATIVE
Glucose, UA: NEGATIVE mg/dL
Hgb urine dipstick: NEGATIVE
Specific Gravity, Urine: 1.025 (ref 1.005–1.030)
Urobilinogen, UA: 0.2 mg/dL (ref 0.0–1.0)
pH: 7.5 (ref 5.0–8.0)

## 2013-02-06 LAB — URINE MICROSCOPIC-ADD ON

## 2013-02-06 MED ORDER — PANTOPRAZOLE SODIUM 40 MG IV SOLR
40.0000 mg | Freq: Once | INTRAVENOUS | Status: AC
  Administered 2013-02-06: 40 mg via INTRAVENOUS
  Filled 2013-02-06: qty 40

## 2013-02-06 MED ORDER — PROMETHAZINE HCL 25 MG/ML IJ SOLN
25.0000 mg | INTRAVENOUS | Status: DC
  Administered 2013-02-06: 25 mg via INTRAVENOUS
  Filled 2013-02-06: qty 1

## 2013-02-06 MED ORDER — LACTATED RINGERS IV SOLN
Freq: Once | INTRAVENOUS | Status: AC
  Administered 2013-02-06: 11:00:00 via INTRAVENOUS

## 2013-02-06 MED ORDER — METOCLOPRAMIDE HCL 5 MG/ML IJ SOLN
10.0000 mg | Freq: Once | INTRAMUSCULAR | Status: AC
  Administered 2013-02-06: 10 mg via INTRAVENOUS
  Filled 2013-02-06: qty 2

## 2013-02-06 NOTE — MAU Note (Signed)
Patient is brought in by ems with c/o n/v, headache, dizziness since yesterday. Patient's husband states that patient had a panic attack at [facility] clinic. Patient is alert, oriented but she is not answering assessment questions. Patient was admitted on Friday for n/v

## 2013-02-06 NOTE — MAU Provider Note (Signed)
Chief Complaint: Nausea, Emesis, Dizziness and Headache   First Provider Initiated Contact with Patient 02/06/13 1017     SUBJECTIVE HPI: [name] is a 27 y.o. G2P1001 at [redacted]w[redacted]d by LMP who Has been followed for intractable vomiting. She was recently admitted and treated for hypokalemia. She also had mildly elevated AST. She has Phenergan suppositories and Reglan at home but has been vomiting several times a day despite using the antiemetics. Unable to retain the Protonix. This is her 6th MAU visit for similar symptoms. Hospitalized last week. She and partner want HH with PICC as she had last pregnancy.  Past Medical History  Diagnosis Date  . Hyperemesis   . Acid reflux    OB History   Grav Para Term Preterm Abortions TAB SAB Ect Mult Living   2 1 1       1      # Outc Date GA Lbr Len/2nd Wgt Sex Del Anes PTL Lv   1 TRM 10/12 [redacted]w[redacted]d 00:20 / 00:32 2.565kg(5lb10.5oz) F SVD None  Yes   2 CUR              Past Surgical History  Procedure Laterality Date  . Wisdom tooth extraction    . Peripherally inserted central catheter insertion     History   Social History  . Marital Status: Married    Spouse Name: N/A    Number of Children: N/A  . Years of Education: N/A   Occupational History  . Not on file.   Social History Main Topics  . Smoking status: Never Smoker   . Smokeless tobacco: Not on file  . Alcohol Use: No  . Drug Use: No  . Sexually Active: Yes    Birth Control/ Protection: None   Other Topics Concern  . Not on file   Social History Narrative  . No narrative on file   No current facility-administered medications on file prior to encounter.   Current Outpatient Prescriptions on File Prior to Encounter  Medication Sig Dispense Refill  . metoCLOPramide (REGLAN) 10 MG tablet Take 1 tablet (10 mg total) by mouth 4 (four) times daily.  60 tablet  1  . pantoprazole (PROTONIX) 20 MG tablet Take 1 tablet (20 mg total) by mouth daily.  30 tablet  0  . potassium  chloride (K-DUR) 10 MEQ tablet Take 2 tablets (20 mEq total) by mouth 2 (two) times daily.  30 tablet  0  . Prenatal Vit-Fe Fumarate-FA (PRENATAL MULTIVITAMIN) TABS Take 1 tablet by mouth daily.      . promethazine (PHENERGAN) 25 MG suppository Place 1 suppository (25 mg total) rectally every 6 (six) hours as needed for nausea.  30 each  0   Allergies  Allergen Reactions  . Cephalosporins Nausea And Vomiting and Rash  . Peanut-Containing Drug Products Other (See Comments)    Reaction unknown  . Zofran Nausea And Vomiting and Other (See Comments)    Chest pain and nose bleeds    ROS: Pertinent items in HPI  OBJECTIVE Blood pressure 130/81, pulse 97, temperature 97 F (36.1 C), temperature source Oral, resp. rate 20, height 5\' 6"  (1.676 m), weight 91.343 kg (201 lb 6 oz), last menstrual period 11/11/2012, SpO2 99.00%. GENERAL: Well-developed, well-nourished female in no acute distress.  HEENT: Normocephalic HEART: normal rate RESP: normal effort ABDOMEN: Soft, non-tender EXTREMITIES: Nontender, no edema NEURO: Alert and oriented  LAB RESULTS Results for orders placed during the hospital encounter of 02/06/13 (from the past 24 hour(s))  COMPREHENSIVE METABOLIC PANEL     Status: Abnormal   Collection Time    02/06/13 10:59 AM      Result Value Range   Sodium 137  135 - 145 mEq/L   Potassium 3.4 (*) 3.5 - 5.1 mEq/L   Chloride 102  96 - 112 mEq/L   CO2 21  19 - 32 mEq/L   Glucose, Bld 96  70 - 99 mg/dL   BUN 7  6 - 23 mg/dL   Creatinine, Ser 1.61  0.50 - 1.10 mg/dL   Calcium 9.9  8.4 - 09.6 mg/dL   Total Protein 7.0  6.0 - 8.3 g/dL   Albumin 3.7  3.5 - 5.2 g/dL   AST 16  0 - 37 U/L   ALT 28  0 - 35 U/L   Alkaline Phosphatase 65  39 - 117 U/L   Total Bilirubin 0.5  0.3 - 1.2 mg/dL   GFR calc non Af Amer >90  >90 mL/min   GFR calc Af Amer >90  >90 mL/min  URINALYSIS, ROUTINE W REFLEX MICROSCOPIC     Status: Abnormal   Collection Time    02/06/13 11:55 AM      Result Value  Range   Color, Urine YELLOW  YELLOW   APPearance CLOUDY (*) CLEAR   Specific Gravity, Urine 1.025  1.005 - 1.030   pH 7.5  5.0 - 8.0   Glucose, UA NEGATIVE  NEGATIVE mg/dL   Hgb urine dipstick NEGATIVE  NEGATIVE   Bilirubin Urine NEGATIVE  NEGATIVE   Ketones, ur >80 (*) NEGATIVE mg/dL   Protein, ur 045 (*) NEGATIVE mg/dL   Urobilinogen, UA 0.2  0.0 - 1.0 mg/dL   Nitrite NEGATIVE  NEGATIVE   Leukocytes, UA NEGATIVE  NEGATIVE  URINE MICROSCOPIC-ADD ON     Status: Abnormal   Collection Time    02/06/13 11:55 AM      Result Value Range   Squamous Epithelial / LPF MANY (*) RARE   WBC, UA 0-2  <3 WBC/hpf   Bacteria, UA MANY (*) RARE   Urine-Other MUCOUS PRESENT      IMAGING No results found.  MAU COURSE IV#1: LR with Phenergan 25 mg> after infused UA (above) indicated dehydration> sx unimproved IV #2:  D5LR with Phenergan 25 mg; Protonix 40 mg IV> sx improved. Sleeping, not vomiting while in MAU  ASSESSMENT 1. Nausea and vomiting in pregnancy prior to [redacted] weeks gestation   2. Dehydration   G2P1001 at [redacted]w[redacted]d  PLAN D/W Dr. Ambrose Mantle who has contacted [facility] to see if she can get home IVs Discharge home    Medication List    TAKE these medications       metoCLOPramide 10 MG tablet  Commonly known as:  REGLAN  Take 1 tablet (10 mg total) by mouth 4 (four) times daily.     pantoprazole 20 MG tablet  Commonly known as:  PROTONIX  Take 1 tablet (20 mg total) by mouth daily.     potassium chloride 10 MEQ tablet  Commonly known as:  K-DUR  Take 2 tablets (20 mEq total) by mouth 2 (two) times daily.     prenatal multivitamin Tabs  Take 1 tablet by mouth daily.     promethazine 25 MG suppository  Commonly known as:  PHENERGAN  Place 1 suppository (25 mg total) rectally every 6 (six) hours as needed for nausea.       Follow-up Information   Follow up with HENLEY,THOMAS F,  MD. (Keep your regular appointment)    Contact information:   [address],  [address], [address] [place] [address] [phone]       Danae Orleans, CNM 02/06/2013  2:24 PM

## 2013-02-09 LAB — OB RESULTS CONSOLE ABO/RH: RH Type: POSITIVE

## 2013-02-09 LAB — OB RESULTS CONSOLE GC/CHLAMYDIA: Gonorrhea: NEGATIVE

## 2013-02-09 LAB — OB RESULTS CONSOLE RPR: RPR: NONREACTIVE

## 2013-02-09 LAB — OB RESULTS CONSOLE HIV ANTIBODY (ROUTINE TESTING): HIV: NONREACTIVE

## 2013-02-11 ENCOUNTER — Inpatient Hospital Stay (HOSPITAL_COMMUNITY)
Admission: AD | Admit: 2013-02-11 | Discharge: 2013-02-12 | Disposition: A | Discharge status: Home / Self Care | Payer: BC Managed Care – PPO | Attending: Obstetrics and Gynecology | Admitting: Obstetrics and Gynecology

## 2013-02-11 DIAGNOSIS — O210 Mild hyperemesis gravidarum: Secondary | ICD-10-CM | POA: Insufficient documentation

## 2013-02-11 NOTE — MAU Note (Signed)
12.[redacted]wks pregnant and gets home IVFs thru [facility] for hyperemesis. IV infiltrated tonight and Dr Ambrose Mantle told pt to come in for IV start

## 2013-02-12 MED ORDER — SODIUM CHLORIDE 0.9 % IJ SOLN
INTRAMUSCULAR | Status: AC
  Filled 2013-02-12: qty 3

## 2013-02-12 NOTE — Progress Notes (Signed)
S:  Pt is a G2P1001 here at 13.0 wks.  Gets home IVFs thru [facility] for hyperemesis. IV infiltrated tonight and Dr Ambrose Mantle told pt to come in for IV start.  O: Filed Vitals:   02/11/13 2329  BP: 122/74  Pulse: 107  Temp: 98.4 F (36.9 C)  Resp: 20   BP 122/74  Pulse 107  Temp(Src) 98.4 F (36.9 C) (Oral)  Resp 20  Ht 5\' 6"  (1.676 m)  Wt 95.346 kg (210 lb 3.2 oz)  BMI 33.94 kg/m2  SpO2 98%  LMP 11/11/2012 General appearance: alert, cooperative and appears stated age Head: Normocephalic, without obvious abnormality, atraumatic Lungs: clear to auscultation bilaterally  A:  Hyperemesis of Pregnancy  P: Order to restart IV [facility]

## 2013-02-14 ENCOUNTER — Ambulatory Visit (HOSPITAL_COMMUNITY)
Admission: RE | Admit: 2013-02-14 | Discharge: 2013-02-14 | Disposition: A | Discharge status: Home / Self Care | Payer: BC Managed Care – PPO | Source: Ambulatory Visit | Attending: Obstetrics and Gynecology | Admitting: Obstetrics and Gynecology

## 2013-02-14 ENCOUNTER — Other Ambulatory Visit (HOSPITAL_COMMUNITY): Payer: Self-pay | Admitting: Obstetrics and Gynecology

## 2013-02-14 DIAGNOSIS — O219 Vomiting of pregnancy, unspecified: Secondary | ICD-10-CM | POA: Insufficient documentation

## 2013-02-14 DIAGNOSIS — O210 Mild hyperemesis gravidarum: Secondary | ICD-10-CM

## 2013-02-14 MED ORDER — LIDOCAINE HCL 1 % IJ SOLN
INTRAMUSCULAR | Status: AC
  Filled 2013-02-14: qty 20

## 2013-02-14 NOTE — Procedures (Signed)
R arm PICC No complication No blood loss. See complete dictation in [facility] P[facility].  

## 2013-02-23 ENCOUNTER — Ambulatory Visit (HOSPITAL_COMMUNITY)
Admission: RE | Admit: 2013-02-23 | Discharge: 2013-02-23 | Disposition: A | Discharge status: Home / Self Care | Payer: BC Managed Care – PPO | Source: Ambulatory Visit | Attending: Obstetrics and Gynecology | Admitting: Obstetrics and Gynecology

## 2013-02-23 ENCOUNTER — Other Ambulatory Visit: Payer: Self-pay | Admitting: Obstetrics and Gynecology

## 2013-02-23 ENCOUNTER — Other Ambulatory Visit (HOSPITAL_COMMUNITY): Payer: Self-pay | Admitting: Obstetrics and Gynecology

## 2013-02-23 DIAGNOSIS — O210 Mild hyperemesis gravidarum: Secondary | ICD-10-CM

## 2013-02-23 DIAGNOSIS — Z452 Encounter for adjustment and management of vascular access device: Secondary | ICD-10-CM | POA: Insufficient documentation

## 2013-02-23 MED ORDER — LIDOCAINE HCL 1 % IJ SOLN
INTRAMUSCULAR | Status: AC
  Filled 2013-02-23: qty 20

## 2013-02-23 NOTE — Procedures (Signed)
Left SL basilic vein PICC placed. Length 48 cm. Tip SVC/RA junction. No immediate complications.  Pt's right arm PICC was removed in its entirety secondary to increased erythema/tenderness (? cellulitis) at insertion site. Dr. Ebony Hail office was notified of findings and antibiotic therapy will be initiated under their direction.

## 2013-03-13 ENCOUNTER — Other Ambulatory Visit: Payer: Self-pay | Admitting: Obstetrics and Gynecology

## 2013-03-13 ENCOUNTER — Inpatient Hospital Stay (HOSPITAL_COMMUNITY)
Admission: AD | Admit: 2013-03-13 | Discharge: 2013-03-13 | Disposition: A | Discharge status: Home / Self Care | Payer: BC Managed Care – PPO | Source: Ambulatory Visit | Attending: Obstetrics and Gynecology | Admitting: Obstetrics and Gynecology

## 2013-03-13 ENCOUNTER — Ambulatory Visit (HOSPITAL_COMMUNITY)
Admission: RE | Admit: 2013-03-13 | Discharge: 2013-03-13 | Disposition: A | Discharge status: Home / Self Care | Payer: BC Managed Care – PPO | Source: Ambulatory Visit | Attending: Obstetrics and Gynecology | Admitting: Obstetrics and Gynecology

## 2013-03-13 VITALS — BP 107/63 | HR 85 | Temp 97.5°F | Resp 18

## 2013-03-13 DIAGNOSIS — E860 Dehydration: Secondary | ICD-10-CM

## 2013-03-13 DIAGNOSIS — Z5309 Procedure and treatment not carried out because of other contraindication: Secondary | ICD-10-CM | POA: Insufficient documentation

## 2013-03-13 DIAGNOSIS — L989 Disorder of the skin and subcutaneous tissue, unspecified: Secondary | ICD-10-CM | POA: Insufficient documentation

## 2013-03-13 LAB — CBC
Platelets: 309 10*3/uL (ref 150–400)
RBC: 3.62 MIL/uL — ABNORMAL LOW (ref 3.87–5.11)
WBC: 12.3 10*3/uL — ABNORMAL HIGH (ref 4.0–10.5)

## 2013-03-13 LAB — PROTIME-INR
INR: 1 (ref 0.00–1.49)
Prothrombin Time: 13.1 seconds (ref 11.6–15.2)

## 2013-03-13 LAB — BASIC METABOLIC PANEL
Calcium: 9.5 mg/dL (ref 8.4–10.5)
GFR calc non Af Amer: >90 mL/min (ref >90)
Sodium: 137 mEq/L (ref 135–145)

## 2013-03-13 LAB — APTT: aPTT: 32 seconds (ref 24–37)

## 2013-03-13 MED ORDER — LACTATED RINGERS IV SOLN
Freq: Once | INTRAVENOUS | Status: AC
  Administered 2013-03-13: 14:00:00 via INTRAVENOUS

## 2013-03-13 MED ORDER — SODIUM CHLORIDE 0.9 % IJ SOLN
10.0000 mL | Freq: Once | INTRAMUSCULAR | Status: AC
  Administered 2013-03-13: 10 mL via INTRAVENOUS

## 2013-03-13 MED ORDER — LACTATED RINGERS IV SOLN
Freq: Once | INTRAVENOUS | Status: AC
  Administered 2013-03-13: 13:00:00 via INTRAVENOUS

## 2013-03-13 MED ORDER — LACTATED RINGERS IV BOLUS (SEPSIS): 2000.0000 mL | Freq: Once | INTRAVENOUS | Status: DC

## 2013-03-13 NOTE — Progress Notes (Signed)
Patient arrived from Radiology to Short Stay to receive IV fluids. Called Dr. Ambrose Mantle to clarify quantity and rate. He stated to give 1 Liter of fluid in Short Stay and allow her to go home to receive additional 1 Liter. Her and her husband have been supplies at home and have been instructed by home health how to connect to PICC line and an IV site. Instructed patient to call Dr. Ebony Hail office for any problems once discharged from Short Stay.

## 2013-03-13 NOTE — Progress Notes (Signed)
Right antecubital saline lock for IV Fluids. Pt skin is pink where clear Tegaderm dressing is around the IV site. Removed Tegaderm and replaced with paper tape and SorbaView Shield Transparent dressing.

## 2013-03-13 NOTE — Progress Notes (Signed)
Patient ID: [name], female   DOB: 12-Jul-1986, 27 y.o.   MRN: [id number] Pt presented today for placement of new PICC following recent removal of left arm PICC placed 02/23/2013. Site of previous left arm PICC was erythematous with chaffing of skin in the outline of previous dressing suggesting possible allergic reaction to tape or PICC line itself. Pt denies recent fevers. The appearance was similar to reaction from past right UE PICC site. Dr. Ebony Hail office was notified and pt will receive peripheral IV hydration today and return on 4/2 for Hickman cath placement.

## 2013-03-13 NOTE — MAU Note (Signed)
Pt here for IVF, orders for PICC line placement in computer.  Dr. Ambrose Mantle called & verified orders.  Pt informed, is going to [employer] for [employer] placement.

## 2013-03-14 ENCOUNTER — Other Ambulatory Visit: Payer: Self-pay | Admitting: Radiology

## 2013-03-15 ENCOUNTER — Ambulatory Visit (HOSPITAL_COMMUNITY)
Admission: RE | Admit: 2013-03-15 | Discharge: 2013-03-15 | Disposition: A | Discharge status: Home / Self Care | Payer: BC Managed Care – PPO | Source: Ambulatory Visit | Attending: Obstetrics and Gynecology | Admitting: Obstetrics and Gynecology

## 2013-03-15 ENCOUNTER — Encounter (HOSPITAL_COMMUNITY): Payer: Self-pay

## 2013-03-15 ENCOUNTER — Other Ambulatory Visit: Payer: Self-pay | Admitting: Obstetrics and Gynecology

## 2013-03-15 ENCOUNTER — Other Ambulatory Visit: Payer: Self-pay | Admitting: Radiology

## 2013-03-15 DIAGNOSIS — E860 Dehydration: Secondary | ICD-10-CM

## 2013-03-15 DIAGNOSIS — O219 Vomiting of pregnancy, unspecified: Secondary | ICD-10-CM | POA: Insufficient documentation

## 2013-03-15 MED ORDER — HEPARIN SOD (PORK) LOCK FLUSH 100 UNIT/ML IV SOLN
250.0000 [IU] | INTRAVENOUS | Status: AC | PRN
  Administered 2013-03-15: 250 [IU]
  Filled 2013-03-15: qty 5

## 2013-03-15 MED ORDER — SODIUM CHLORIDE 0.9 % IJ SOLN
10.0000 mL | Freq: Once | INTRAMUSCULAR | Status: AC
  Administered 2013-03-15: 10 mL

## 2013-03-15 MED ORDER — LIDOCAINE HCL 1 % IJ SOLN
INTRAMUSCULAR | Status: AC
  Filled 2013-03-15: qty 20

## 2013-03-15 MED ORDER — ACETAMINOPHEN 500 MG PO TABS
1000.0000 mg | ORAL_TABLET | Freq: Once | ORAL | Status: AC
  Administered 2013-03-15: 1000 mg via ORAL
  Filled 2013-03-15: qty 2

## 2013-03-15 MED ORDER — CLINDAMYCIN PHOSPHATE 600 MG/50ML IV SOLN
600.0000 mg | Freq: Once | INTRAVENOUS | Status: AC
  Administered 2013-03-15: 600 mg via INTRAVENOUS
  Filled 2013-03-15 (×2): qty 50

## 2013-03-15 NOTE — Procedures (Signed)
R IJ SL Hickman No complication No blood loss. See complete dictation in [facility].

## 2013-03-15 NOTE — Progress Notes (Signed)
Called [facility] and spoke with the patient's case manager Myrene Buddy) regarding care and instructions for use on Hickman catheter. She stated she would follow up with Dr. Ebony Hail office and set up a time this evening for patient to receive instructions.

## 2013-03-22 IMAGING — US IR FLUORO GUIDE CV LINE*L*
1 series · 1 of 1 positions shown · non-contrast
Comparison: none

CLINICAL DATA: Patient with history of hyperemesis gravidarum and
previously placed right upper extremity PICC on 02/15/2013.  The
patient presents today with erythematous and tender right arm PICC
insertion site suspicious for cellulitis.   Request is now made for
right upper extremity PICC removal and placement of a new PICC via
the left arm.

[Series 1: ir fluoro guide cv line*right* · 1 of 1 slices shown]
[im 1/1]
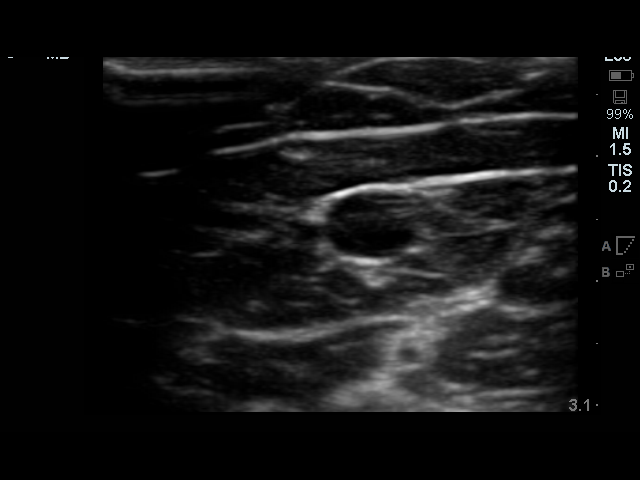

[1 of 1 positions shown; findings below may reference images not displayed]

PICC LINE PLACEMENT WITH ULTRASOUND AND FLUOROSCOPIC  GUIDANCE

Fluoroscopy Time: 0.4 minutes.

The patient's right upper extremity PICC was removed in its
entirety.  The left arm was prepped with chlorhexidine, draped in
the usual sterile fashion using maximum barrier technique (cap and
mask, sterile gown, sterile gloves, large sterile sheet, hand
hygiene and cutaneous antisepsis) and infiltrated locally with 1%
Lidocaine.  The abdomen and pelvis was wrapped with a lead apron.

Ultrasound demonstrated patency of the left basilic vein, and this
was documented with an image.  Under real-time ultrasound guidance,
this vein was accessed with a 21 gauge micropuncture needle and
image documentation was performed.  The needle was exchanged over a
guidewire for a peel-away sheath through which a five French single
lumen PICC trimmed to 48 cm was advanced, positioned with its tip
at the lower SVC/right atrial junction.  Fluoroscopy during the
procedure and fluoro spot radiograph confirms appropriate catheter
position.  The catheter was flushed, secured to the skin with
Prolene sutures, and covered with a sterile dressing.

Complications:  none
IMPRESSION: Successful left  arm PICC line placement with ultrasound and
fluoroscopic guidance.  The catheter is ready for use. The
patient's right arm PICC was removed in its entirety.  Dr. [REDACTED] was notified of the above findings  and antibiotic therapy
will be started under their direction.

## 2013-04-11 IMAGING — US IR US GUIDE VASC ACCESS RIGHT
1 series · 1 of 1 positions shown · non-contrast
Comparison: none

CLINICAL DATA: Hyperemesis during pregnancy, needing long-term IV
access.  Inflammatory reaction to the PICC lines in bilateral upper
extremities.

RIGHT IJ TUNNELED CENTRAL VENOUS CATHETER PLACEMENT UNDER
ULTRASOUND AND FLUOROSCOPIC GUIDANCE:
TECHNIQUE: The procedure, risks, benefits, and alternatives were
explained to the patient.  Questions regarding the procedure were
encouraged and answered.  The patient understands and consents to
the procedure.

[Series 1: ir fluoro guide cv line*right* · 1 of 1 slices shown]
[im 1/1]
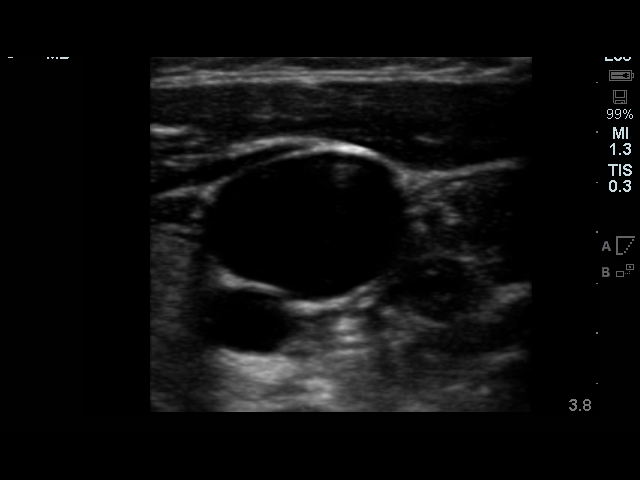

[1 of 1 positions shown; findings below may reference images not displayed]

As antibiotic prophylaxis, clindamycin 500 mg IV was ordered pre-
procedure and administered intravenously within one hour of
incision.

Patency of the right IJ vein was confirmed with ultrasound with
image documentation. An appropriate skin site was determined.
Region was prepped using maximum barrier technique including cap
and mask, sterile gown, sterile gloves, large sterile sheet, and
Chlorhexidine   as cutaneous antisepsis. The region was infiltrated
locally with 1% lidocaine. Under real-time ultrasound guidance, the
right IJ vein was accessed with a 21 gauge micropuncture needle;
the needle tip within the vein was confirmed with ultrasound image
documentation.   Needle exchanged over the 018 guidewire for
transitional dilator, which allowed advancement of a Benson wire
into the IVC. Over this, an MPA catheter was advanced. A single
lumen silicon Hickman catheter was tunneled from the right anterior
chest wall approach to the right IJ dermatotomy site, and cut to
the appropriate length. The MPA catheter was exchanged over an
Amplatz wire for  a peel-away sheath, through which the catheter
was advanced under intermittent fluoroscopy, positioned with its
tips in the proximal and midright atrium. Spot chest radiograph
confirms good catheter position. No pneumothorax. Catheter was
flushed and primed per protocol. Catheter secured externally with
0-silk suture. The right IJ   dermatotomy site was closed with
Dermabond. No immediate complication.
IMPRESSION: 1. Technically successful placement of tunneled right IJ Hickman
catheter with ultrasound and fluoroscopic guidance. Ready for
routine use.

## 2013-08-05 ENCOUNTER — Inpatient Hospital Stay (HOSPITAL_COMMUNITY): Payer: BC Managed Care – PPO | Admitting: Anesthesiology

## 2013-08-05 ENCOUNTER — Inpatient Hospital Stay (HOSPITAL_COMMUNITY)
Admission: AD | Admit: 2013-08-05 | Discharge: 2013-08-07 | DRG: 373 | Disposition: A | Discharge status: Home / Self Care | Payer: BC Managed Care – PPO | Source: Ambulatory Visit | Attending: Obstetrics and Gynecology | Admitting: Obstetrics and Gynecology

## 2013-08-05 ENCOUNTER — Encounter (HOSPITAL_COMMUNITY): Payer: Self-pay | Admitting: *Deleted

## 2013-08-05 ENCOUNTER — Encounter (HOSPITAL_COMMUNITY): Payer: Self-pay | Admitting: Anesthesiology

## 2013-08-05 DIAGNOSIS — O99892 Other specified diseases and conditions complicating childbirth: Principal | ICD-10-CM | POA: Diagnosis present

## 2013-08-05 DIAGNOSIS — Z22330 Carrier of Group B streptococcus: Secondary | ICD-10-CM

## 2013-08-05 LAB — RPR: RPR Ser Ql: NONREACTIVE

## 2013-08-05 LAB — CBC
Hemoglobin: 12.2 g/dL (ref 12.0–15.0)
MCHC: 35.5 g/dL (ref 30.0–36.0)
Platelets: 281 10*3/uL (ref 150–400)
RDW: 13.1 % (ref 11.5–15.5)

## 2013-08-05 MED ORDER — OXYCODONE-ACETAMINOPHEN 5-325 MG PO TABS: 1.0000 | ORAL_TABLET | ORAL | Status: DC | PRN

## 2013-08-05 MED ORDER — PHENYLEPHRINE 40 MCG/ML (10ML) SYRINGE FOR IV PUSH (FOR BLOOD PRESSURE SUPPORT)
80.0000 ug | PREFILLED_SYRINGE | INTRAVENOUS | Status: DC | PRN
  Filled 2013-08-05: qty 2
  Filled 2013-08-05: qty 5

## 2013-08-05 MED ORDER — BENZOCAINE-MENTHOL 20-0.5 % EX AERO: 1.0000 "application " | INHALATION_SPRAY | CUTANEOUS | Status: DC | PRN

## 2013-08-05 MED ORDER — OXYTOCIN 40 UNITS IN LACTATED RINGERS INFUSION - SIMPLE MED
62.5000 mL/h | INTRAVENOUS | Status: DC
  Filled 2013-08-05: qty 1000

## 2013-08-05 MED ORDER — IBUPROFEN 600 MG PO TABS
600.0000 mg | ORAL_TABLET | Freq: Four times a day (QID) | ORAL | Status: DC
  Administered 2013-08-05 – 2013-08-07 (×9): 600 mg via ORAL
  Filled 2013-08-05 (×8): qty 1

## 2013-08-05 MED ORDER — ONDANSETRON HCL 4 MG/2ML IJ SOLN: 4.0000 mg | Freq: Four times a day (QID) | INTRAMUSCULAR | Status: DC | PRN

## 2013-08-05 MED ORDER — FLEET ENEMA 7-19 GM/118ML RE ENEM: 1.0000 | ENEMA | RECTAL | Status: DC | PRN

## 2013-08-05 MED ORDER — TETANUS-DIPHTH-ACELL PERTUSSIS 5-2.5-18.5 LF-MCG/0.5 IM SUSP: 0.5000 mL | Freq: Once | INTRAMUSCULAR | Status: DC

## 2013-08-05 MED ORDER — CITRIC ACID-SODIUM CITRATE 334-500 MG/5ML PO SOLN: 30.0000 mL | ORAL | Status: DC | PRN

## 2013-08-05 MED ORDER — FENTANYL 2.5 MCG/ML BUPIVACAINE 1/10 % EPIDURAL INFUSION (WH - ANES)
14.0000 mL/h | INTRAMUSCULAR | Status: DC | PRN
  Filled 2013-08-05: qty 125

## 2013-08-05 MED ORDER — DIPHENHYDRAMINE HCL 50 MG/ML IJ SOLN: 12.5000 mg | INTRAMUSCULAR | Status: DC | PRN

## 2013-08-05 MED ORDER — METHYLERGONOVINE MALEATE 0.2 MG PO TABS: 0.2000 mg | ORAL_TABLET | ORAL | Status: DC | PRN

## 2013-08-05 MED ORDER — PHENYLEPHRINE 40 MCG/ML (10ML) SYRINGE FOR IV PUSH (FOR BLOOD PRESSURE SUPPORT)
80.0000 ug | PREFILLED_SYRINGE | INTRAVENOUS | Status: AC | PRN
  Administered 2013-08-05: 40 ug via INTRAVENOUS
  Administered 2013-08-05 (×2): 80 ug via INTRAVENOUS

## 2013-08-05 MED ORDER — MAGNESIUM HYDROXIDE 400 MG/5ML PO SUSP: 30.0000 mL | ORAL | Status: DC | PRN

## 2013-08-05 MED ORDER — LIDOCAINE HCL (PF) 1 % IJ SOLN
30.0000 mL | INTRAMUSCULAR | Status: DC | PRN
  Filled 2013-08-05 (×2): qty 30

## 2013-08-05 MED ORDER — LACTATED RINGERS IV SOLN: 500.0000 mL | INTRAVENOUS | Status: DC | PRN

## 2013-08-05 MED ORDER — EPHEDRINE 5 MG/ML INJ
10.0000 mg | INTRAVENOUS | Status: DC | PRN
  Filled 2013-08-05: qty 2

## 2013-08-05 MED ORDER — PRENATAL MULTIVITAMIN CH
1.0000 | ORAL_TABLET | Freq: Every day | ORAL | Status: DC
  Administered 2013-08-05 – 2013-08-06 (×2): 1 via ORAL
  Filled 2013-08-05 (×2): qty 1

## 2013-08-05 MED ORDER — LANOLIN HYDROUS EX OINT: TOPICAL_OINTMENT | CUTANEOUS | Status: DC | PRN

## 2013-08-05 MED ORDER — ACETAMINOPHEN 325 MG PO TABS: 650.0000 mg | ORAL_TABLET | ORAL | Status: DC | PRN

## 2013-08-05 MED ORDER — LACTATED RINGERS IV SOLN
INTRAVENOUS | Status: DC
  Administered 2013-08-05: 05:00:00 via INTRAVENOUS

## 2013-08-05 MED ORDER — DIBUCAINE 1 % RE OINT: 1.0000 "application " | TOPICAL_OINTMENT | RECTAL | Status: DC | PRN

## 2013-08-05 MED ORDER — LIDOCAINE HCL (PF) 1 % IJ SOLN
INTRAMUSCULAR | Status: DC | PRN
  Administered 2013-08-05 (×3): 5 mL

## 2013-08-05 MED ORDER — WITCH HAZEL-GLYCERIN EX PADS: 1.0000 "application " | MEDICATED_PAD | CUTANEOUS | Status: DC | PRN

## 2013-08-05 MED ORDER — SODIUM CHLORIDE 0.9 % IV SOLN
2.0000 g | Freq: Once | INTRAVENOUS | Status: AC
  Administered 2013-08-05: 2 g via INTRAVENOUS
  Filled 2013-08-05: qty 2000

## 2013-08-05 MED ORDER — FENTANYL 2.5 MCG/ML BUPIVACAINE 1/10 % EPIDURAL INFUSION (WH - ANES)
INTRAMUSCULAR | Status: DC | PRN
  Administered 2013-08-05: 14 mL/h via EPIDURAL

## 2013-08-05 MED ORDER — LACTATED RINGERS IV SOLN
500.0000 mL | Freq: Once | INTRAVENOUS | Status: AC
  Administered 2013-08-05: 500 mL via INTRAVENOUS

## 2013-08-05 MED ORDER — MEASLES, MUMPS & RUBELLA VAC ~~LOC~~ INJ
0.5000 mL | INJECTION | Freq: Once | SUBCUTANEOUS | Status: DC
  Filled 2013-08-05: qty 0.5

## 2013-08-05 MED ORDER — IBUPROFEN 600 MG PO TABS: 600.0000 mg | ORAL_TABLET | Freq: Four times a day (QID) | ORAL | Status: DC | PRN

## 2013-08-05 MED ORDER — SIMETHICONE 80 MG PO CHEW: 80.0000 mg | CHEWABLE_TABLET | ORAL | Status: DC | PRN

## 2013-08-05 MED ORDER — DIPHENHYDRAMINE HCL 25 MG PO CAPS: 25.0000 mg | ORAL_CAPSULE | Freq: Four times a day (QID) | ORAL | Status: DC | PRN

## 2013-08-05 MED ORDER — SENNOSIDES-DOCUSATE SODIUM 8.6-50 MG PO TABS
2.0000 | ORAL_TABLET | Freq: Every day | ORAL | Status: DC
  Administered 2013-08-05 – 2013-08-06 (×2): 2 via ORAL

## 2013-08-05 MED ORDER — EPHEDRINE 5 MG/ML INJ
10.0000 mg | INTRAVENOUS | Status: DC | PRN
  Filled 2013-08-05: qty 2
  Filled 2013-08-05: qty 4

## 2013-08-05 MED ORDER — METHYLERGONOVINE MALEATE 0.2 MG/ML IJ SOLN: 0.2000 mg | INTRAMUSCULAR | Status: DC | PRN

## 2013-08-05 MED ORDER — OXYTOCIN BOLUS FROM INFUSION
500.0000 mL | INTRAVENOUS | Status: DC
  Administered 2013-08-05: 500 mL via INTRAVENOUS

## 2013-08-05 MED ORDER — ZOLPIDEM TARTRATE 5 MG PO TABS: 5.0000 mg | ORAL_TABLET | Freq: Every evening | ORAL | Status: DC | PRN

## 2013-08-05 NOTE — Anesthesia Postprocedure Evaluation (Signed)
  Anesthesia Post-op Note  Anesthesia Post Note  Patient: [name]  Procedure(s) Performed: * No procedures listed *  Anesthesia type: Epidural  Patient location: Mother/Baby  Post pain: Pain level controlled  Post assessment: Post-op Vital signs reviewed  Last Vitals:  Filed Vitals:   08/05/13 0645  BP: 124/74  Pulse: 79  Temp: 36.5 C  Resp: 18    Post vital signs: Reviewed  Level of consciousness:alert  Complications: No apparent anesthesia complications

## 2013-08-05 NOTE — Anesthesia Preprocedure Evaluation (Signed)

## 2013-08-05 NOTE — MAU Note (Signed)
Pt reports ctx on and off since midnight. denies SROM or bleeding and reports good fetal movement

## 2013-08-05 NOTE — Anesthesia Procedure Notes (Signed)
Epidural Patient location during procedure: OB  Staffing Anesthesiologist: Phillips Grout Performed by: anesthesiologist   Preanesthetic Checklist Completed: patient identified, site marked, surgical consent, pre-op evaluation, timeout performed, IV checked, risks and benefits discussed and monitors and equipment checked  Epidural Patient position: sitting Prep: ChloraPrep Patient monitoring: heart rate, continuous pulse ox and blood pressure Approach: right paramedian Injection technique: LOR saline  Needle:  Needle type: Tuohy  Needle gauge: 17 G Needle length: 9 cm and 9 Needle insertion depth: 8 cm Catheter type: closed end flexible Catheter size: 20 Guage Catheter at skin depth: 15 cm Test dose: negative  Assessment Events: blood not aspirated, injection not painful, no injection resistance, negative IV test and no paresthesia  Additional Notes   Patient tolerated the insertion well without complications.

## 2013-08-05 NOTE — H&P (Signed)
[name] is a 27 y.o. female, G2 P1001, EGA 37+ weeks with EDC 9-7 presenting for eval of reg ctx.  In MAU VE 7 cm, pt admitted.  She has received an epidural and a dose of Ampicillin.  Prenatal care complicated by hyperemesis requiring 2 PICC lines and a port for IV fluids and antiemetics, otherwise uncomplicated.  See prenatal records for complete history.  Maternal Medical History:  Reason for admission: Contractions.   Contractions: Frequency: regular.   Perceived severity is strong.    Fetal activity: Perceived fetal activity is normal.      OB History   Grav Para Term Preterm Abortions TAB SAB Ect Mult Living   2 1 1       1     SVD at term, hyperemesis  Past Medical History  Diagnosis Date  . Hyperemesis   . Acid reflux    Past Surgical History  Procedure Laterality Date  . Wisdom tooth extraction    . Peripherally inserted central catheter insertion     Family History: family history includes Depression in her sister; Hypertension in her mother. Social History:  reports that she has never smoked. She does not have any smokeless tobacco history on file. She reports that she does not drink alcohol or use illicit drugs.   Prenatal Transfer Tool  Maternal Diabetes: No Genetic Screening: Declined Maternal Ultrasounds/Referrals: Normal Fetal Ultrasounds or other Referrals:  None Maternal Substance Abuse:  No Significant Maternal Medications:  None Significant Maternal Lab Results:  Lab values include: Group B Strep positive Other Comments:  hyperemesis  Review of Systems  Respiratory: Negative.   Cardiovascular: Negative.     Dilation: Lip/rim Effacement (%): 100 Station: -1 Exam by:: C. Blackstock, RN Blood pressure 87/38, pulse 102, resp. rate 18, height 5\' 6"  (1.676 m), weight 95.709 kg (211 lb), last menstrual period 11/11/2012, SpO2 98.00%. Maternal Exam:  Uterine Assessment: Contraction strength is firm.  Contraction frequency is regular.    Abdomen: Patient reports no abdominal tenderness. Estimated fetal weight is 7 1/2 lbs.   Fetal presentation: vertex  Introitus: Normal vulva. Normal vagina.  Amniotic fluid character: clear.  Pelvis: adequate for delivery.   Cervix: Cervix evaluated by digital exam.     Fetal Exam Fetal Monitor Review: Mode: ultrasound.   Baseline rate: 130.  Variability: moderate (6-25 bpm).   Pattern: accelerations present and no decelerations.    Fetal State Assessment: Category I - tracings are normal.     Physical Exam  Constitutional: She appears well-developed and well-nourished.  Cardiovascular: Normal rate, regular rhythm and normal heart sounds.   No murmur heard. Respiratory: Effort normal and breath sounds normal. No respiratory distress.  GI: Soft.  gravid    Prenatal labs: ABO, Rh:  O pos Antibody:  neg Rubella:  Imm RPR:   NR HBsAg:   Neg HIV:   Neg GBS:   Positive GCT:  95  Assessment/Plan: IUP at 37+ weeks in active labor, GBS+.  Has an epidural, had SROM with clear fluid, has just received Ampicillin.  Will try to hold off pushing as long as possible to allow Ampicillin to circulate, anticipate SVD.   Dhillon Comunale D 08/05/2013, 3:58 AM

## 2013-08-06 NOTE — Progress Notes (Signed)
PPD #1 No problems Afeb, VSS Fundus firm, NT at U-1 Continue routine postpartum care 

## 2013-08-07 MED ORDER — IBUPROFEN 200 MG PO TABS: 600.0000 mg | ORAL_TABLET | Freq: Four times a day (QID) | ORAL | Status: DC | PRN

## 2013-08-07 NOTE — Discharge Summary (Signed)
Obstetric Discharge Summary Reason for Admission: onset of labor Prenatal Procedures: none Intrapartum Procedures: spontaneous vaginal delivery Postpartum Procedures: none Complications-Operative and Postpartum: none Hemoglobin  Date Value Range Status  08/05/2013 12.2  12.0 - 15.0 g/dL Final     HCT  Date Value Range Status  08/05/2013 34.4* 36.0 - 46.0 % Final    Physical Exam:  General: alert Lochia: appropriate Uterine Fundus: firm  Discharge Diagnoses: Term Pregnancy-delivered  Discharge Information: Date: 08/07/2013 Activity: pelvic rest Diet: routine Medications: Ibuprofen Condition: stable Instructions: refer to practice specific booklet Discharge to: home Follow-up Information   Follow up with Maritta Kief D, MD. Schedule an appointment as soon as possible for a visit in 6 weeks.   Specialty:  Obstetrics and Gynecology   Contact information:   [address] [place] [address] [phone]       Newborn Data: Live born female  Birth Weight: 5 lb 13.8 oz (2660 g) APGAR: 8, 9  Home with mother.  Rebel Laughridge D 08/07/2013, 8:26 AM

## 2013-08-07 NOTE — Progress Notes (Signed)
PPD #2 No problems Afeb, VSS D/c home 

## 2014-10-15 ENCOUNTER — Encounter (HOSPITAL_COMMUNITY): Payer: Self-pay | Admitting: *Deleted

## 2016-05-21 ENCOUNTER — Encounter: Payer: Self-pay | Admitting: Pediatrics

## 2017-06-28 ENCOUNTER — Ambulatory Visit (INDEPENDENT_AMBULATORY_CARE_PROVIDER_SITE_OTHER): Payer: BC Managed Care – PPO | Admitting: Physician Assistant

## 2017-06-28 ENCOUNTER — Other Ambulatory Visit: Payer: Self-pay

## 2017-06-28 ENCOUNTER — Encounter: Payer: Self-pay | Admitting: Physician Assistant

## 2017-06-28 VITALS — BP 110/80 | HR 78 | Ht 63.75 in | Wt 239.2 lb

## 2017-06-28 DIAGNOSIS — Z0001 Encounter for general adult medical examination with abnormal findings: Secondary | ICD-10-CM | POA: Diagnosis not present

## 2017-06-28 DIAGNOSIS — Z0000 Encounter for general adult medical examination without abnormal findings: Secondary | ICD-10-CM

## 2017-06-28 DIAGNOSIS — F321 Major depressive disorder, single episode, moderate: Secondary | ICD-10-CM | POA: Diagnosis not present

## 2017-06-28 DIAGNOSIS — T7840XA Allergy, unspecified, initial encounter: Secondary | ICD-10-CM | POA: Insufficient documentation

## 2017-06-28 DIAGNOSIS — K219 Gastro-esophageal reflux disease without esophagitis: Secondary | ICD-10-CM | POA: Insufficient documentation

## 2017-06-28 DIAGNOSIS — Z136 Encounter for screening for cardiovascular disorders: Secondary | ICD-10-CM

## 2017-06-28 DIAGNOSIS — Z13220 Encounter for screening for lipoid disorders: Secondary | ICD-10-CM | POA: Diagnosis not present

## 2017-06-28 DIAGNOSIS — F419 Anxiety disorder, unspecified: Secondary | ICD-10-CM | POA: Insufficient documentation

## 2017-06-28 DIAGNOSIS — F32A Depression, unspecified: Secondary | ICD-10-CM | POA: Insufficient documentation

## 2017-06-28 DIAGNOSIS — F329 Major depressive disorder, single episode, unspecified: Secondary | ICD-10-CM | POA: Insufficient documentation

## 2017-06-28 LAB — LIPID PANEL
CHOLESTEROL: 210 mg/dL — AB (ref 0–200)
HDL: 52.5 mg/dL (ref >39.00)
LDL CALC: 147 mg/dL — AB (ref 0–99)
NonHDL: 157.02
TRIGLYCERIDES: 52 mg/dL (ref 0.0–149.0)
Total CHOL/HDL Ratio: 4
VLDL: 10.4 mg/dL (ref 0.0–40.0)

## 2017-06-28 LAB — COMPREHENSIVE METABOLIC PANEL
ALBUMIN: 4.5 g/dL (ref 3.5–5.2)
ALT: 23 U/L (ref 0–35)
AST: 22 U/L (ref 0–37)
Alkaline Phosphatase: 49 U/L (ref 39–117)
BUN: 9 mg/dL (ref 6–23)
CHLORIDE: 106 meq/L (ref 96–112)
CO2: 23 meq/L (ref 19–32)
Calcium: 9.8 mg/dL (ref 8.4–10.5)
Creatinine, Ser: 0.72 mg/dL (ref 0.40–1.20)
GFR: 121.02 mL/min (ref >60.00)
Glucose, Bld: 92 mg/dL (ref 70–99)
POTASSIUM: 3.9 meq/L (ref 3.5–5.1)
SODIUM: 137 meq/L (ref 135–145)
Total Bilirubin: 0.6 mg/dL (ref 0.2–1.2)
Total Protein: 7.6 g/dL (ref 6.0–8.3)

## 2017-06-28 LAB — CBC WITH DIFFERENTIAL/PLATELET
BASOS PCT: 0.4 % (ref 0.0–3.0)
Basophils Absolute: 0 10*3/uL (ref 0.0–0.1)
EOS PCT: 1.9 % (ref 0.0–5.0)
Eosinophils Absolute: 0.1 10*3/uL (ref 0.0–0.7)
HEMATOCRIT: 41.4 % (ref 36.0–46.0)
HEMOGLOBIN: 14.2 g/dL (ref 12.0–15.0)
Lymphocytes Relative: 31.1 % (ref 12.0–46.0)
Lymphs Abs: 2.4 10*3/uL (ref 0.7–4.0)
MCHC: 34.1 g/dL (ref 30.0–36.0)
MCV: 98.6 fl (ref 78.0–100.0)
MONO ABS: 0.6 10*3/uL (ref 0.1–1.0)
Monocytes Relative: 7.9 % (ref 3.0–12.0)
NEUTROS ABS: 4.6 10*3/uL (ref 1.4–7.7)
Neutrophils Relative %: 58.7 % (ref 43.0–77.0)
PLATELETS: 324 10*3/uL (ref 150.0–400.0)
RBC: 4.2 Mil/uL (ref 3.87–5.11)
RDW: 13.1 % (ref 11.5–15.5)
WBC: 7.9 10*3/uL (ref 4.0–10.5)

## 2017-06-28 LAB — TSH: TSH: 1.72 u[IU]/mL (ref 0.35–4.50)

## 2017-06-28 MED ORDER — CITALOPRAM HYDROBROMIDE 20 MG PO TABS: 20.0000 mg | ORAL_TABLET | Freq: Every day | ORAL | 0 refills | Status: DC
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
## 2017-06-28 NOTE — Patient Instructions (Signed)
It was great to meet you!  Start Celexa daily. Take with food. Follow-up with [place] in 6 weeks, sooner if needed, we are here for you!  We will contact you regarding your lab results.  Health Maintenance, Female Adopting a healthy lifestyle and getting preventive care can go a long way to promote health and wellness. Talk with your health care provider about what schedule of regular examinations is right for you. This is a good chance for you to check in with your provider about disease prevention and staying healthy. In between checkups, there are plenty of things you can do on your own. Experts have done a lot of research about which lifestyle changes and preventive measures are most likely to keep you healthy. Ask your health care provider for more information. Weight and diet Eat a healthy diet  Be sure to include plenty of vegetables, fruits, low-fat dairy products, and lean protein.  Do not eat a lot of foods high in solid fats, added sugars, or salt.  Get regular exercise. This is one of the most important things you can do for your health. ? Most adults should exercise for at least 150 minutes each week. The exercise should increase your heart rate and make you sweat (moderate-intensity exercise). ? Most adults should also do strengthening exercises at least twice a week. This is in addition to the moderate-intensity exercise.  Maintain a healthy weight  Body mass index (BMI) is a measurement that can be used to identify possible weight problems. It estimates body fat based on height and weight. Your health care provider can help determine your BMI and help you achieve or maintain a healthy weight.  For females 22 years of age and older: ? A BMI below 18.5 is considered underweight. ? A BMI of 18.5 to 24.9 is normal. ? A BMI of 25 to 29.9 is considered overweight. ? A BMI of 30 and above is considered obese.  Watch levels of cholesterol and blood lipids  You should start having  your blood tested for lipids and cholesterol at 32 years of age, then have this test every 5 years.  You may need to have your cholesterol levels checked more often if: ? Your lipid or cholesterol levels are high. ? You are older than 32 years of age. ? You are at high risk for heart disease.  Cancer screening Lung Cancer  Lung cancer screening is recommended for adults 52-30 years old who are at high risk for lung cancer because of a history of smoking.  A yearly low-dose CT scan of the lungs is recommended for people who: ? Currently smoke. ? Have quit within the past 15 years. ? Have at least a 30-pack-year history of smoking. A pack year is smoking an average of one pack of cigarettes a day for 1 year.  Yearly screening should continue until it has been 15 years since you quit.  Yearly screening should stop if you develop a health problem that would prevent you from having lung cancer treatment.  Breast Cancer  Practice breast self-awareness. This means understanding how your breasts normally appear and feel.  It also means doing regular breast self-exams. Let your health care provider know about any changes, no matter how small.  If you are in your 20s or 30s, you should have a clinical breast exam (CBE) by a health care provider every 1-3 years as part of a regular health exam.  If you are 55 or older, have a CBE  every year. Also consider having a breast X-ray (mammogram) every year.  If you have a family history of breast cancer, talk to your health care provider about genetic screening.  If you are at high risk for breast cancer, talk to your health care provider about having an MRI and a mammogram every year.  Breast cancer gene (BRCA) assessment is recommended for women who have family members with BRCA-related cancers. BRCA-related cancers include: ? Breast. ? Ovarian. ? Tubal. ? Peritoneal cancers.  Results of the assessment will determine the need for genetic  counseling and BRCA1 and BRCA2 testing.  Cervical Cancer Your health care provider may recommend that you be screened regularly for cancer of the pelvic organs (ovaries, uterus, and vagina). This screening involves a pelvic examination, including checking for microscopic changes to the surface of your cervix (Pap test). You may be encouraged to have this screening done every 3 years, beginning at age 21.  For women ages 30-65, health care providers may recommend pelvic exams and Pap testing every 3 years, or they may recommend the Pap and pelvic exam, combined with testing for human papilloma virus (HPV), every 5 years. Some types of HPV increase your risk of cervical cancer. Testing for HPV may also be done on women of any age with unclear Pap test results.  Other health care providers may not recommend any screening for nonpregnant women who are considered low risk for pelvic cancer and who do not have symptoms. Ask your health care provider if a screening pelvic exam is right for you.  If you have had past treatment for cervical cancer or a condition that could lead to cancer, you need Pap tests and screening for cancer for at least 20 years after your treatment. If Pap tests have been discontinued, your risk factors (such as having a new sexual partner) need to be reassessed to determine if screening should resume. Some women have medical problems that increase the chance of getting cervical cancer. In these cases, your health care provider may recommend more frequent screening and Pap tests.  Colorectal Cancer  This type of cancer can be detected and often prevented.  Routine colorectal cancer screening usually begins at 32 years of age and continues through 32 years of age.  Your health care provider may recommend screening at an earlier age if you have risk factors for colon cancer.  Your health care provider may also recommend using home test kits to check for hidden blood in the  stool.  A small camera at the end of a tube can be used to examine your colon directly (sigmoidoscopy or colonoscopy). This is done to check for the earliest forms of colorectal cancer.  Routine screening usually begins at age 50.  Direct examination of the colon should be repeated every 5-10 years through 32 years of age. However, you may need to be screened more often if early forms of precancerous polyps or small growths are found.  Skin Cancer  Check your skin from head to toe regularly.  Tell your health care provider about any new moles or changes in moles, especially if there is a change in a mole's shape or color.  Also tell your health care provider if you have a mole that is larger than the size of a pencil eraser.  Always use sunscreen. Apply sunscreen liberally and repeatedly throughout the day.  Protect yourself by wearing long sleeves, pants, a wide-brimmed hat, and sunglasses whenever you are outside.  Heart disease, diabetes,   and high blood pressure  High blood pressure causes heart disease and increases the risk of stroke. High blood pressure is more likely to develop in: ? People who have blood pressure in the high end of the normal range (130-139/85-89 mm Hg). ? People who are overweight or obese. ? People who are African American.  If you are 18-39 years of age, have your blood pressure checked every 3-5 years. If you are 40 years of age or older, have your blood pressure checked every year. You should have your blood pressure measured twice-once when you are at a hospital or clinic, and once when you are not at a hospital or clinic. Record the average of the two measurements. To check your blood pressure when you are not at a hospital or clinic, you can use: ? An automated blood pressure machine at a pharmacy. ? A home blood pressure monitor.  If you are between 55 years and 79 years old, ask your health care provider if you should take aspirin to prevent  strokes.  Have regular diabetes screenings. This involves taking a blood sample to check your fasting blood sugar level. ? If you are at a normal weight and have a low risk for diabetes, have this test once every three years after 32 years of age. ? If you are overweight and have a high risk for diabetes, consider being tested at a younger age or more often. Preventing infection Hepatitis B  If you have a higher risk for hepatitis B, you should be screened for this virus. You are considered at high risk for hepatitis B if: ? You were born in a country where hepatitis B is common. Ask your health care provider which countries are considered high risk. ? Your parents were born in a high-risk country, and you have not been immunized against hepatitis B (hepatitis B vaccine). ? You have HIV or AIDS. ? You use needles to inject street drugs. ? You live with someone who has hepatitis B. ? You have had sex with someone who has hepatitis B. ? You get hemodialysis treatment. ? You take certain medicines for conditions, including cancer, organ transplantation, and autoimmune conditions.  Hepatitis C  Blood testing is recommended for: ? Everyone born from 1945 through 1965. ? Anyone with known risk factors for hepatitis C.  Sexually transmitted infections (STIs)  You should be screened for sexually transmitted infections (STIs) including gonorrhea and chlamydia if: ? You are sexually active and are younger than 32 years of age. ? You are older than 32 years of age and your health care provider tells you that you are at risk for this type of infection. ? Your sexual activity has changed since you were last screened and you are at an increased risk for chlamydia or gonorrhea. Ask your health care provider if you are at risk.  If you do not have HIV, but are at risk, it may be recommended that you take a prescription medicine daily to prevent HIV infection. This is called pre-exposure prophylaxis  (PrEP). You are considered at risk if: ? You are sexually active and do not regularly use condoms or know the HIV status of your partner(s). ? You take drugs by injection. ? You are sexually active with a partner who has HIV.  Talk with your health care provider about whether you are at high risk of being infected with HIV. If you choose to begin PrEP, you should first be tested for HIV. You should then   be tested every 3 months for as long as you are taking PrEP. Pregnancy  If you are premenopausal and you may become pregnant, ask your health care provider about preconception counseling.  If you may become pregnant, take 400 to 800 micrograms (mcg) of folic acid every day.  If you want to prevent pregnancy, talk to your health care provider about birth control (contraception). Osteoporosis and menopause  Osteoporosis is a disease in which the bones lose minerals and strength with aging. This can result in serious bone fractures. Your risk for osteoporosis can be identified using a bone density scan.  If you are 51 years of age or older, or if you are at risk for osteoporosis and fractures, ask your health care provider if you should be screened.  Ask your health care provider whether you should take a calcium or vitamin D supplement to lower your risk for osteoporosis.  Menopause may have certain physical symptoms and risks.  Hormone replacement therapy may reduce some of these symptoms and risks. Talk to your health care provider about whether hormone replacement therapy is right for you. Follow these instructions at home:  Schedule regular health, dental, and eye exams.  Stay current with your immunizations.  Do not use any tobacco products including cigarettes, chewing tobacco, or electronic cigarettes.  If you are pregnant, do not drink alcohol.  If you are breastfeeding, limit how much and how often you drink alcohol.  Limit alcohol intake to no more than 1 drink per day for  nonpregnant women. One drink equals 12 ounces of beer, 5 ounces of wine, or 1 ounces of hard liquor.  Do not use street drugs.  Do not share needles.  Ask your health care provider for help if you need support or information about quitting drugs.  Tell your health care provider if you often feel depressed.  Tell your health care provider if you have ever been abused or do not feel safe at home. This information is not intended to replace advice given to you by your health care provider. Make sure you discuss any questions you have with your health care provider. Document Released: 06/15/2011 Document Revised: 05/07/2016 Document Reviewed: 09/03/2015 Elsevier Interactive Patient Education  [employer].

## 2017-07-29 LAB — HM PAP SMEAR

## 2017-07-29 LAB — CBC AND DIFFERENTIAL: HEMOGLOBIN: 14.3 (ref 12.0–16.0)

## 2017-08-04 ENCOUNTER — Encounter: Payer: Self-pay | Admitting: Physician Assistant

## 2017-08-04 NOTE — Progress Notes (Signed)
07/29/2017 

## 2017-08-05 ENCOUNTER — Encounter: Payer: Self-pay | Admitting: *Deleted

## 2017-08-09 ENCOUNTER — Encounter: Payer: Self-pay | Admitting: Physician Assistant

## 2017-08-09 ENCOUNTER — Ambulatory Visit (INDEPENDENT_AMBULATORY_CARE_PROVIDER_SITE_OTHER): Payer: BC Managed Care – PPO | Admitting: Physician Assistant

## 2017-08-09 ENCOUNTER — Ambulatory Visit (INDEPENDENT_AMBULATORY_CARE_PROVIDER_SITE_OTHER): Payer: BC Managed Care – PPO

## 2017-08-09 VITALS — BP 136/78 | HR 71 | Temp 99.6°F | Ht 63.75 in | Wt 223.4 lb

## 2017-08-09 DIAGNOSIS — R0781 Pleurodynia: Secondary | ICD-10-CM | POA: Diagnosis not present

## 2017-08-09 DIAGNOSIS — F321 Major depressive disorder, single episode, moderate: Secondary | ICD-10-CM

## 2017-08-09 DIAGNOSIS — R21 Rash and other nonspecific skin eruption: Secondary | ICD-10-CM | POA: Diagnosis not present

## 2017-08-09 LAB — POCT URINE PREGNANCY: PREG TEST UR: NEGATIVE

## 2017-08-09 MED ORDER — CITALOPRAM HYDROBROMIDE 40 MG PO TABS: 40.0000 mg | ORAL_TABLET | Freq: Every day | ORAL | 1 refills | Status: DC

## 2017-08-09 NOTE — Progress Notes (Signed)
[name] is a 31 y.o. female is here for follow up on depression and to discuss a rash.  I acted as a Neurosurgeon for [employer], PA-C [name], LPN  History of Present Illness:   Chief Complaint  Patient presents with  . Follow-up    6 week  . Depression  . Rash    Depression       The patient presents with depression.  This is a chronic problem.  The problem occurs every several days.  The problem has been gradually improving since onset.  Associated symptoms include fatigue, insomnia, irritable and restlessness.  Associated symptoms include no decreased concentration, no helplessness, no hopelessness, no decreased interest, no appetite change, no body aches, no myalgias, no headaches, no indigestion, not sad and no suicidal ideas.     The symptoms are aggravated by family issues, work stress and social issues.  Compliance with treatment is good.  Previous treatment provided mild relief.  Past medical history includes anxiety and depression.   Rash  This is a new problem. Episode onset: Started 3 weeks ago. The problem has been gradually worsening since onset. The rash is diffuse. The rash is characterized by redness, burning and itchiness (burning on hands). It is unknown if there was an exposure to a precipitant. Associated symptoms include fatigue and joint pain. Pertinent negatives include no cough, diarrhea, shortness of breath or sore throat. (Has been exercising.) Past treatments include antihistamine, oral steroids and anti-itch cream. The treatment provided mild relief.  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 Denies any airway swelling. Mom with arthritis but no other rheumatological issues that she is aware of. Rash started on face, and then has gone to legs/arms.  Rib Pain She endorses L anterior rib pain at the bottom of rib cage. Pain is worse with deep inspiration but not with  movement or activity, such as when working out at the gym. Patient denies chest pain, SOB, blurred vision, dizziness, unusual headaches, lower leg swelling. Denies any known trauma to the area.   No LMP recorded. Patient is not currently having periods (Reason: IUD).  GAD 7 : Generalized Anxiety Score 08/09/2017 06/28/2017 06/28/2017  Nervous, Anxious, on Edge 1 2 2   Control/stop worrying 1 3 3   Worry too much - different things 2 3 3   Trouble relaxing 2 2 2   Restless 2 1 1   Easily annoyed or irritable 2 3 3   Afraid - awful might happen 1 2 2   Total GAD 7 Score 11 16 16   Anxiety Difficulty - - Very difficult   Depression screen [facility] 2/9 08/09/2017 06/28/2017 06/28/2017  Decreased Interest 1 3 3   Down, Depressed, Hopeless 1 2 2   PHQ - 2 Score 2 5 5   Altered sleeping 2 3 3   Tired, decreased energy 2 3 3   Change in appetite 1 2 2   Feeling bad or failure about yourself  2 3 3   Trouble concentrating 1 3 3   Moving slowly or fidgety/restless 0 2 2  Suicidal thoughts 0 0 0  PHQ-9 Score 10 21 21   Difficult doing work/chores - Very difficult -      Health Maintenance Due  Topic Date Due  . PAP SMEAR  11/16/2006    Past Medical History:  Diagnosis Date  . Acid reflux   . Allergy   . Anxiety   . Depression   . Hyperlipidemia  Social History   Social History  . Marital status: Married    Spouse name: N/A  . Number of children: N/A  . Years of education: N/A   Occupational History  . Not on file.   Social History Main Topics  . Smoking status: Never Smoker  . Smokeless tobacco: Never Used  . Alcohol use No  . Drug use: No  . Sexual activity: Yes    Birth control/ protection: None   Other Topics Concern  . Not on file   Social History Narrative   Married with 2 kids -- daughters   Works at [employer] and [employer] (2nd shift)   Live in [place]    Past Surgical History:  Procedure Laterality Date  . PERIPHERALLY INSERTED CENTRAL CATHETER INSERTION    . WISDOM TOOTH  EXTRACTION    . WISDOM TOOTH EXTRACTION Bilateral     Family History  Problem Relation Age of Onset  . Hypertension Mother   . Hyperlipidemia Mother   . Depression Sister   . Heart disease Maternal Grandmother   . Heart disease Maternal Grandfather   . Heart disease Paternal Grandmother   . Heart disease Paternal Grandfather   . Diabetes Paternal Aunt   . Diabetes Paternal Uncle   . Breast cancer Neg Hx   . Colon cancer Neg Hx     PMHx, SurgHx, SocialHx, FamHx, Medications, and Allergies were reviewed in the Visit Navigator and updated as appropriate.   Patient Active Problem List   Diagnosis Date Noted  . Depression 06/28/2017  . Acid reflux 06/28/2017  . Allergy 06/28/2017  . Anxiety 06/28/2017    Social History  Substance Use Topics  . Smoking status: Never Smoker  . Smokeless tobacco: Never Used  . Alcohol use No    Current Medications and Allergies:    Current Outpatient Prescriptions:  .  diphenhydrAMINE (BENADRYL) 25 MG tablet, Take 50 mg by mouth as needed., Disp: , Rfl:  .  ibuprofen (ADVIL,MOTRIN) 200 MG tablet, Take 3 tablets (600 mg total) by mouth every 6 (six) hours as needed for pain., Disp: 30 tablet, Rfl: 0 .  levonorgestrel (MIRENA) 20 MCG/24HR IUD, 1 each by Intrauterine route once. Inserted 08/2013, Disp: , Rfl:  .  citalopram (CELEXA) 40 MG tablet, Take 1 tablet (40 mg total) by mouth daily., Disp: 30 tablet, Rfl: 1 .  fluticasone (FLONASE) 50 MCG/ACT nasal spray, fluticasone 50 mcg/actuation nasal spray,suspension, Disp: , Rfl:    Allergies  Allergen Reactions  . Cephalosporins Nausea And Vomiting and Rash  . Peanut-Containing Drug Products Other (See Comments)    Reaction unknown  . Sulfamethoxazole-Trimethoprim   . Zofran Nausea And Vomiting and Other (See Comments)    Chest pain and nose bleeds    Review of Systems   Review of Systems  Constitutional: Positive for fatigue. Negative for appetite change.  HENT: Negative for sore  throat.   Respiratory: Negative for cough and shortness of breath.   Gastrointestinal: Negative for diarrhea.  Musculoskeletal: Positive for joint pain. Negative for myalgias.  Skin: Positive for rash.  Neurological: Negative for headaches.  Psychiatric/Behavioral: Positive for depression. Negative for decreased concentration and suicidal ideas. The patient has insomnia.     Vitals:   Vitals:   08/09/17 1451  BP: 136/78  Pulse: 71  Temp: 99.6 F (37.6 C)  TempSrc: Oral  SpO2: 97%  Weight: 223 lb 6.1 oz (101.3 kg)  Height: 5' 3.75" (1.619 m)     Body mass index is  38.64 kg/m.   Physical Exam:    Physical Exam  Constitutional: She appears well-developed. She is irritable and cooperative.  Non-toxic appearance. She does not have a sickly appearance. She does not appear ill. No distress.  HENT:  Mouth/Throat: Uvula is midline, oropharynx is clear and moist and mucous membranes are normal. No posterior oropharyngeal edema or posterior oropharyngeal erythema. Tonsils are 1+ on the right. Tonsils are 1+ on the left. No tonsillar exudate.  Cardiovascular: Normal rate, regular rhythm, S1 normal, S2 normal, normal heart sounds and normal pulses.   No LE edema  Pulmonary/Chest: Effort normal and breath sounds normal.    Neurological: She is alert. GCS eye subscore is 4. GCS verbal subscore is 5. GCS motor subscore is 6.  Skin: Skin is warm, dry and intact.  No rash present during today's exam  Psychiatric: She has a normal mood and affect. Her speech is normal and behavior is normal.  Nursing note and vitals reviewed.  CLINICAL DATA:  LEFT anterior lower rib pain under LEFT breast for 1 day, no injury, worsened pain with deep inspiration, low-grade fever today  EXAM: LEFT RIBS AND CHEST - 3+ VIEW  COMPARISON:  None  FINDINGS: Normal heart size, mediastinal contours, and pulmonary vascularity.  Minimal peribronchial thickening.  Linear subsegmental atelectasis RIGHT  upper lobe.  No acute infiltrate, pleural effusion, or pneumothorax.  Osseous mineralization normal.  No rib fracture or bone destruction.  IMPRESSION: Minimal bronchitic changes without infiltrate.  No acute LEFT rib abnormalities.   Assessment and Plan:    [name] was seen today for follow-up, depression and rash.  Diagnoses and all orders for this visit:  Rash She is currently asymptomatic without rash. Will obtain routine labs as well as ANA and inflammatory markers. Unable to determine etiology, question if she has a chronic urticaria or some sort of allergic dermatitis with unknown trigger. Will await labs prior to determining next steps. I am going to have her start daily Claritin 10 mg. We discussed having an Epi-Pen on hand however she declines today, stating that she feels as though she does not need it. Discussed that this medication can be used to save lives during anaphylactic shock but she declined. Consider allergy referral if symptoms persist. -     CBC with Differential/Platelet -     Comprehensive metabolic panel -     C-reactive protein -     Sedimentation rate -     ANA -     POCT urine pregnancy  Depression, major, single episode, moderate (HCC) Increase Celexa to 40 mg. PHQ-9 improved from 21 to 9 today. Follow-up in 6 weeks. I discussed with patient that if they develop any SI, to tell someone immediately and seek medical attention.  Rib pain on left side X-ray without acute abnormalities of rib. Does show bronchitic changes. No exertional symptoms. We discussed EKG, but she declined. Suspect MSK-related. May take prn ibuprofen. Follow-up with [place] if symptoms worsen or persist. -     DG Ribs Unilateral W/Chest Left; Future -     POCT urine pregnancy  Other orders -     citalopram (CELEXA) 40 MG tablet; Take 1 tablet (40 mg total) by mouth daily.  . Reviewed expectations re: course of current medical issues. . Discussed self-management of  symptoms. . Outlined signs and symptoms indicating need for more acute intervention. . Patient verbalized understanding and all questions were answered. . See orders for this visit as documented in the electronic medical record. Marland Kitchen  Patient received an After Visit Summary.  CMA or LPN served as scribe during this visit. History, Physical, and Plan performed by medical provider. Documentation and orders reviewed and attested to.  Jarold Motto, PA-C [place] [place] 08/10/2017  Follow-up: Return in about 6 weeks (around 09/20/2017) for depression and rash.

## 2017-08-09 NOTE — Patient Instructions (Addendum)
It was great to see you!  We will contact you with your lab and chest xray results.  If you develop any significant chest pain, shortness of breath, or other worrisome symptoms -- please seek medical attention.  Consider taking ibuprofen, 2 tablets every 4 to 6 hours as needed for your rib pain.  For your rash, take 10mg  claritin (generic loratadine okay) daily.  If you develop any suicidal thoughts, please seek medical attention and tell someone!  Follow-up in 4-6 weeks for these issues, sooner if needed.

## 2017-08-10 ENCOUNTER — Other Ambulatory Visit: Payer: Self-pay | Admitting: Physician Assistant

## 2017-08-10 ENCOUNTER — Telehealth: Payer: Self-pay | Admitting: Physician Assistant

## 2017-08-10 DIAGNOSIS — R768 Other specified abnormal immunological findings in serum: Secondary | ICD-10-CM

## 2017-08-10 LAB — ANTI-NUCLEAR AB-TITER (ANA TITER)

## 2017-08-10 LAB — CBC WITH DIFFERENTIAL/PLATELET
BASOS ABS: 0 10*3/uL (ref 0.0–0.1)
Basophils Relative: 0.6 % (ref 0.0–3.0)
EOS PCT: 1.5 % (ref 0.0–5.0)
Eosinophils Absolute: 0.1 10*3/uL (ref 0.0–0.7)
HEMATOCRIT: 42.5 % (ref 36.0–46.0)
HEMOGLOBIN: 14 g/dL (ref 12.0–15.0)
LYMPHS ABS: 2.5 10*3/uL (ref 0.7–4.0)
LYMPHS PCT: 29.1 % (ref 12.0–46.0)
MCHC: 33 g/dL (ref 30.0–36.0)
MCV: 102.2 fl — AB (ref 78.0–100.0)
MONOS PCT: 8.9 % (ref 3.0–12.0)
Monocytes Absolute: 0.8 10*3/uL (ref 0.1–1.0)
Neutro Abs: 5.2 10*3/uL (ref 1.4–7.7)
Neutrophils Relative %: 59.9 % (ref 43.0–77.0)
Platelets: 273 10*3/uL (ref 150.0–400.0)
RBC: 4.16 Mil/uL (ref 3.87–5.11)
RDW: 13.6 % (ref 11.5–15.5)
WBC: 8.6 10*3/uL (ref 4.0–10.5)

## 2017-08-10 LAB — COMPREHENSIVE METABOLIC PANEL
ALT: 26 U/L (ref 0–35)
AST: 18 U/L (ref 0–37)
Albumin: 4.5 g/dL (ref 3.5–5.2)
Alkaline Phosphatase: 43 U/L (ref 39–117)
BILIRUBIN TOTAL: 0.7 mg/dL (ref 0.2–1.2)
BUN: 8 mg/dL (ref 6–23)
CHLORIDE: 106 meq/L (ref 96–112)
CO2: 22 meq/L (ref 19–32)
Calcium: 9.4 mg/dL (ref 8.4–10.5)
Creatinine, Ser: 0.76 mg/dL (ref 0.40–1.20)
GFR: 113.62 mL/min (ref >60.00)
GLUCOSE: 77 mg/dL (ref 70–99)
Potassium: 3.8 mEq/L (ref 3.5–5.1)
Sodium: 137 mEq/L (ref 135–145)
Total Protein: 7 g/dL (ref 6.0–8.3)

## 2017-08-10 LAB — ANA: ANA: POSITIVE — AB

## 2017-08-10 LAB — SEDIMENTATION RATE: Sed Rate: 8 mm/hr (ref 0–20)

## 2017-08-10 LAB — C-REACTIVE PROTEIN: CRP: 0.1 mg/dL — ABNORMAL LOW (ref 0.5–20.0)

## 2017-08-10 NOTE — Telephone Encounter (Signed)
Patient returning call RE lab results.  Ty,  -LL

## 2017-08-17 NOTE — Telephone Encounter (Signed)
Patient notified of labs on 8/29 by Britt BottomJamie Wheeley, CMA

## 2017-09-08 ENCOUNTER — Encounter: Payer: Self-pay | Admitting: Physician Assistant

## 2017-09-08 ENCOUNTER — Other Ambulatory Visit: Payer: Self-pay | Admitting: Physician Assistant

## 2017-09-08 DIAGNOSIS — R21 Rash and other nonspecific skin eruption: Secondary | ICD-10-CM

## 2017-09-14 ENCOUNTER — Encounter: Payer: Self-pay | Admitting: Physician Assistant

## 2017-09-14 DIAGNOSIS — R21 Rash and other nonspecific skin eruption: Secondary | ICD-10-CM | POA: Insufficient documentation

## 2017-09-20 ENCOUNTER — Encounter: Payer: Self-pay | Admitting: Physician Assistant

## 2017-09-20 ENCOUNTER — Ambulatory Visit (INDEPENDENT_AMBULATORY_CARE_PROVIDER_SITE_OTHER): Payer: BC Managed Care – PPO | Admitting: Physician Assistant

## 2017-09-20 VITALS — BP 110/74 | HR 73 | Temp 98.2°F | Ht 63.75 in | Wt 211.2 lb

## 2017-09-20 DIAGNOSIS — F330 Major depressive disorder, recurrent, mild: Secondary | ICD-10-CM | POA: Diagnosis not present

## 2017-09-20 DIAGNOSIS — F419 Anxiety disorder, unspecified: Secondary | ICD-10-CM | POA: Diagnosis not present

## 2017-09-20 DIAGNOSIS — Z23 Encounter for immunization: Secondary | ICD-10-CM | POA: Diagnosis not present

## 2017-09-20 MED ORDER — CITALOPRAM HYDROBROMIDE 40 MG PO TABS: 40.0000 mg | ORAL_TABLET | Freq: Every day | ORAL | 1 refills | Status: DC

## 2017-09-20 NOTE — Patient Instructions (Signed)
CONGRATS ON THE WEIGHT LOSS!! Keep it up!!  Follow-up with [place] in 6 months, sooner if needed.

## 2017-09-20 NOTE — Progress Notes (Signed)
[name] is a 31 y.o. female is here to discuss: Depression and Anxiety.  I acted as a Neurosurgeon for [employer], PA-C [name], LPN  History of Present Illness:   Chief Complaint  Patient presents with  . Follow-up  . Depression  . Anxiety    Depression         This is a chronic problem.  Episode onset: Past 4 months.   The problem occurs every several days.  The problem has been gradually improving since onset.  Associated symptoms include decreased concentration, fatigue, insomnia, irritable, restlessness and appetite change.  Associated symptoms include no helplessness, no hopelessness, no decreased interest, no body aches, no headaches, no indigestion, not sad and no suicidal ideas.     The symptoms are aggravated by family issues and work stress.  Compliance with treatment is good.  Previous treatment provided moderate relief.  Past medical history includes anxiety.   Anxiety  Presents for follow-up visit. Symptoms include decreased concentration, depressed mood, insomnia, nervous/anxious behavior and restlessness. Patient reports no chest pain, compulsions, confusion, dizziness, dry mouth, excessive worry, muscle tension, nausea, palpitations, panic or suicidal ideas. Symptoms occur occasionally. The most recent episode lasted 30 minutes. The severity of symptoms is moderate. The quality of sleep is fair. Nighttime awakenings: several.   Compliance with medications is 76-100%. Treatment side effects: No side effects.     GAD 7 : Generalized Anxiety Score 09/20/2017 08/09/2017 06/28/2017 06/28/2017  Nervous, Anxious, on Edge Control/stop worrying Worry too much - different things Trouble relaxing Restless Easily annoyed or irritable Afraid - awful might happen 0 Total GAD 7 Score Anxiety Difficulty Somewhat difficult - - Very difficult      Depression screen [facility] 2/9 09/20/2017 08/09/2017  06/28/2017  Decreased Interest 0 1 3  Down, Depressed, Hopeless PHQ - 2 Score Altered sleeping Tired, decreased energy Change in appetite 0 1 2  Feeling bad or failure about yourself  Trouble concentrating Moving slowly or fidgety/restless 0 0 2  Suicidal thoughts 0 0 0  PHQ-9 Score Difficult doing work/chores Somewhat difficult - Very difficult    She is working really hard on weight loss. She is exercising 4-5 days a week and eating better. Congratulated patient on this!   Wt Readings from Last 3 Encounters:  09/20/17 211 lb 4 oz (95.8 kg)  08/09/17 223 lb 6.1 oz (101.3 kg)  06/28/17 239 lb 3.2 oz (108.5 kg)     Health Maintenance Due  Topic Date Due  . PAP SMEAR  11/16/2006    Past Medical History:  Diagnosis Date  . Acid reflux   . Allergy   . Anxiety   . Depression   . Hyperlipidemia      Social History   Social History  . Marital status: Married    Spouse name: N/A  . Number of children: N/A  . Years of education: N/A   Occupational History  . Not on file.   Social History Main Topics  . Smoking status: Never Smoker  . Smokeless tobacco: Never Used  . Alcohol use No  . Drug use:  No  . Sexual activity: Yes    Birth control/ protection: None   Other Topics Concern  . Not on file   Social History Narrative   Married with 2 kids -- daughters   Works at [employer] and [employer] (2nd shift)   Live in [place]    Past Surgical History:  Procedure Laterality Date  . PERIPHERALLY INSERTED CENTRAL CATHETER INSERTION    . WISDOM TOOTH EXTRACTION    . WISDOM TOOTH EXTRACTION Bilateral     Family History  Problem Relation Age of Onset  . Hypertension Mother   . Hyperlipidemia Mother   . Depression Sister   . Heart disease Maternal Grandmother   . Heart disease Maternal Grandfather   . Heart disease Paternal Grandmother   . Heart disease Paternal Grandfather   . Diabetes Paternal Aunt   . Diabetes  Paternal Uncle   . Breast cancer Neg Hx   . Colon cancer Neg Hx     PMHx, SurgHx, SocialHx, FamHx, Medications, and Allergies were reviewed in the Visit Navigator and updated as appropriate.   Patient Active Problem List   Diagnosis Date Noted  . Rash 09/14/2017  . Depression 06/28/2017  . Acid reflux 06/28/2017  . Allergy 06/28/2017  . Anxiety 06/28/2017    Social History  Substance Use Topics  . Smoking status: Never Smoker  . Smokeless tobacco: Never Used  . Alcohol use No    Current Medications and Allergies:    Current Outpatient Prescriptions:  .  citalopram (CELEXA) 40 MG tablet, Take 1 tablet (40 mg total) by mouth daily., Disp: 90 tablet, Rfl: 1 .  diphenhydrAMINE (BENADRYL) 25 MG tablet, Take 50 mg by mouth as needed., Disp: , Rfl:  .  fluticasone (FLONASE) 50 MCG/ACT nasal spray, fluticasone 50 mcg/actuation nasal spray,suspension, Disp: , Rfl:  .  ibuprofen (ADVIL,MOTRIN) 200 MG tablet, Take 3 tablets (600 mg total) by mouth every 6 (six) hours as needed for pain., Disp: 30 tablet, Rfl: 0 .  levonorgestrel (MIRENA) 20 MCG/24HR IUD, 1 each by Intrauterine route once. Inserted 08/2013, Disp: , Rfl:    Allergies  Allergen Reactions  . Cephalosporins Nausea And Vomiting and Rash  . Peanut-Containing Drug Products Other (See Comments)    Reaction unknown  . Sulfamethoxazole-Trimethoprim   . Zofran Nausea And Vomiting and Other (See Comments)    Chest pain and nose bleeds    Review of Systems   Review of Systems  Constitutional: Positive for appetite change and fatigue. Negative for chills, fever and malaise/fatigue.  Cardiovascular: Negative for chest pain and palpitations.  Gastrointestinal: Negative for abdominal pain, diarrhea, nausea and vomiting.  Neurological: Negative for dizziness and headaches.  Psychiatric/Behavioral: Positive for decreased concentration and depression. Negative for confusion and suicidal ideas. The patient is nervous/anxious and  has insomnia.     Vitals:   Vitals:   09/20/17 0836  BP: 110/74  Pulse: 73  Temp: 98.2 F (36.8 C)  TempSrc: Oral  SpO2: 97%  Weight: 211 lb 4 oz (95.8 kg)  Height: 5' 3.75" (1.619 m)     Body mass index is 36.55 kg/m.   Physical Exam:    Physical Exam  Constitutional: She appears well-developed. She is irritable and cooperative.  Non-toxic appearance. She does not have a sickly appearance. She does not appear ill. No distress.  Cardiovascular: Normal rate, regular rhythm, S1 normal, S2 normal, normal heart sounds and normal pulses.   No LE edema  Pulmonary/Chest: Effort normal and breath sounds normal.  Neurological: She is alert. GCS eye subscore is 4. GCS verbal subscore is 5. GCS motor subscore is 6.  Skin: Skin is warm, dry and intact.  Psychiatric: She has a normal mood and affect. Her speech is normal and behavior is normal.  Pleasant and cheerful today  Nursing note and vitals reviewed.    Assessment and Plan:    Ynez was seen today for follow-up, depression and anxiety.  Diagnoses and all orders for this visit:  Mild episode of recurrent major depressive disorder (HCC) and Anxiety She is doing really well on Celexa 40 mg daily. Her PHQ and GAD scores are improving. She denies SI/HI. We discussed starting adjunctive medicine to help with her mood, but she denies, would like to continue on current treatment. Follow-up in 22-months, sooner if needed.  I discussed with patient that if they develop any SI, to tell someone immediately and seek medical attention.  Need for prophylactic vaccination and inoculation against influenza -     Flu Vaccine QUAD 36+ mos IM  Other orders -     citalopram (CELEXA) 40 MG tablet; Take 1 tablet (40 mg total) by mouth daily.    . Reviewed expectations re: course of current medical issues. . Discussed self-management of symptoms. . Outlined signs and symptoms indicating need for more acute intervention. . Patient verbalized  understanding and all questions were answered. . See orders for this visit as documented in the electronic medical record. . Patient received an After Visit Summary.  CMA or LPN served as scribe during this visit. History, Physical, and Plan performed by medical provider. Documentation and orders reviewed and attested to.  Jarold Motto, PA-C [place], [place] 09/20/2017  Follow-up: Return in about 6 months (around 03/21/2018) for depression/anxiety f/u.

## 2018-02-18 DIAGNOSIS — M1990 Unspecified osteoarthritis, unspecified site: Secondary | ICD-10-CM

## 2018-02-18 HISTORY — DX: Unspecified osteoarthritis, unspecified site: M19.90

## 2018-02-21 ENCOUNTER — Emergency Department (HOSPITAL_COMMUNITY)
Admission: EM | Admit: 2018-02-21 | Discharge: 2018-02-21 | Disposition: A | Discharge status: Home / Self Care | Payer: BC Managed Care – PPO | Attending: Emergency Medicine | Admitting: Emergency Medicine

## 2018-02-21 ENCOUNTER — Emergency Department (HOSPITAL_COMMUNITY): Payer: BC Managed Care – PPO

## 2018-02-21 ENCOUNTER — Encounter (HOSPITAL_COMMUNITY): Payer: Self-pay | Admitting: *Deleted

## 2018-02-21 ENCOUNTER — Encounter: Payer: Self-pay | Admitting: Physician Assistant

## 2018-02-21 ENCOUNTER — Ambulatory Visit: Payer: BC Managed Care – PPO | Admitting: Physician Assistant

## 2018-02-21 VITALS — BP 110/84 | HR 80 | Temp 98.1°F | Ht 63.75 in | Wt 204.5 lb

## 2018-02-21 DIAGNOSIS — R1084 Generalized abdominal pain: Secondary | ICD-10-CM

## 2018-02-21 DIAGNOSIS — Z91010 Allergy to peanuts: Secondary | ICD-10-CM | POA: Insufficient documentation

## 2018-02-21 DIAGNOSIS — R1012 Left upper quadrant pain: Secondary | ICD-10-CM | POA: Diagnosis present

## 2018-02-21 DIAGNOSIS — E785 Hyperlipidemia, unspecified: Secondary | ICD-10-CM | POA: Insufficient documentation

## 2018-02-21 DIAGNOSIS — Z79899 Other long term (current) drug therapy: Secondary | ICD-10-CM | POA: Insufficient documentation

## 2018-02-21 LAB — URINALYSIS, ROUTINE W REFLEX MICROSCOPIC
Bilirubin Urine: NEGATIVE
Glucose, UA: NEGATIVE mg/dL
KETONES UR: NEGATIVE mg/dL
Leukocytes, UA: NEGATIVE
Nitrite: NEGATIVE
PROTEIN: NEGATIVE mg/dL
Specific Gravity, Urine: 1.014 (ref 1.005–1.030)
pH: 5 (ref 5.0–8.0)

## 2018-02-21 LAB — COMPREHENSIVE METABOLIC PANEL
ALBUMIN: 4.4 g/dL (ref 3.5–5.0)
ALK PHOS: 54 U/L (ref 38–126)
ALT: 26 U/L (ref 14–54)
ANION GAP: 6 (ref 5–15)
AST: 20 U/L (ref 15–41)
BILIRUBIN TOTAL: 0.9 mg/dL (ref 0.3–1.2)
BUN: 12 mg/dL (ref 6–20)
CO2: 23 mmol/L (ref 22–32)
Calcium: 9.5 mg/dL (ref 8.9–10.3)
Chloride: 107 mmol/L (ref 101–111)
Creatinine, Ser: 0.72 mg/dL (ref 0.44–1.00)
GFR calc Af Amer: >60 mL/min (ref >60)
GFR calc non Af Amer: >60 mL/min (ref >60)
GLUCOSE: 80 mg/dL (ref 65–99)
POTASSIUM: 4 mmol/L (ref 3.5–5.1)
SODIUM: 136 mmol/L (ref 135–145)
Total Protein: 7.6 g/dL (ref 6.5–8.1)

## 2018-02-21 LAB — LIPASE, BLOOD: Lipase: 32 U/L (ref 11–51)

## 2018-02-21 LAB — CBC
HEMATOCRIT: 42.9 % (ref 36.0–46.0)
HEMOGLOBIN: 14.8 g/dL (ref 12.0–15.0)
MCH: 33.9 pg (ref 26.0–34.0)
MCHC: 34.5 g/dL (ref 30.0–36.0)
MCV: 98.4 fL (ref 78.0–100.0)
Platelets: 307 10*3/uL (ref 150–400)
RBC: 4.36 MIL/uL (ref 3.87–5.11)
RDW: 12.4 % (ref 11.5–15.5)
WBC: 8.7 10*3/uL (ref 4.0–10.5)

## 2018-02-21 LAB — POC URINE PREG, ED: Preg Test, Ur: NEGATIVE

## 2018-02-21 MED ORDER — KETOROLAC TROMETHAMINE 30 MG/ML IJ SOLN
15.0000 mg | Freq: Once | INTRAMUSCULAR | Status: AC
  Administered 2018-02-21: 15 mg via INTRAVENOUS
  Filled 2018-02-21: qty 1

## 2018-02-21 MED ORDER — SODIUM CHLORIDE 0.9 % IV BOLUS (SEPSIS)
1000.0000 mL | Freq: Once | INTRAVENOUS | Status: AC
  Administered 2018-02-21: 1000 mL via INTRAVENOUS

## 2018-02-21 MED ORDER — GI COCKTAIL ~~LOC~~
30.0000 mL | Freq: Once | ORAL | Status: AC
  Administered 2018-02-21: 30 mL via ORAL
  Filled 2018-02-21: qty 30

## 2018-02-21 MED ORDER — FAMOTIDINE 20 MG PO TABS: 20.0000 mg | ORAL_TABLET | Freq: Two times a day (BID) | ORAL | 0 refills | Status: DC

## 2018-02-21 MED ORDER — TRAMADOL HCL 50 MG PO TABS: 50.0000 mg | ORAL_TABLET | Freq: Four times a day (QID) | ORAL | 0 refills | Status: DC | PRN

## 2018-02-21 NOTE — ED Notes (Signed)
ED Provider at bedside. 

## 2018-02-21 NOTE — Discharge Instructions (Signed)
As discussed, your evaluation today has been largely reassuring.  But, it is important that you monitor your condition carefully, and do not hesitate to return to the ED if you develop new, or concerning changes in your condition.  Your symptoms are likely due to irritation of the stomach lining, though it is possible that you recently passed a kidney stone as well.

## 2018-02-21 NOTE — ED Provider Notes (Signed)
[facility]-EMERGENCY DEPT Provider Note   CSN: [id number] Arrival date & time: 02/21/18  1012     History   Chief Complaint Chief Complaint  Patient presents with  . Abdominal Pain    HPI [name] is a 32 y.o. female.  HPI Presents with concern of abdominal pain. Pain is down to the left upper abdomen, and left flank, with occasional diffuse radiation.  Line pain is sore, severe, crampy, with associated anorexia, and minimal p.o. intake. Patient does not have diarrhea, but does have rapid transit of food when eating during this illness. Onset was about 2 days ago, since onset symptoms been worsening, with no clear alleviating or exacerbating factors beyond food intake. No dysuria, hematuria. Patient states that she is generally well She does note a history of cholelithiasis during pregnancy, as well as hyperemesis at the same time. She denies current pregnancy.  Past Medical History:  Diagnosis Date  . Acid reflux   . Allergy   . Anxiety   . Depression   . Hyperlipidemia     Patient Active Problem List   Diagnosis Date Noted  . Rash 09/14/2017  . Depression 06/28/2017  . Acid reflux 06/28/2017  . Allergy 06/28/2017  . Anxiety 06/28/2017    Past Surgical History:  Procedure Laterality Date  . PERIPHERALLY INSERTED CENTRAL CATHETER INSERTION    . WISDOM TOOTH EXTRACTION    . WISDOM TOOTH EXTRACTION Bilateral     OB History    Gravida Para Term Preterm AB Living   2 2 2     2    SAB TAB Ectopic Multiple Live Births           2       Home Medications    Prior to Admission medications   Medication Sig Start Date End Date Taking? Authorizing Provider  Azelastine-Fluticasone (DYMISTA) 137-50 MCG/ACT SUSP Dymista 137 mcg-50 mcg/spray nasal spray 1 spray each nare twice a day    [provider]  citalopram (CELEXA) 40 MG tablet Take 1 tablet (40 mg total) by mouth daily. 09/20/17   Jarold MottoWorley, Samantha, PA  doxycycline  (VIBRA-TABS) 100 MG tablet Take 100 mg by mouth 2 (two) times daily.  02/15/18   [provider]  fluticasone (FLONASE) 50 MCG/ACT nasal spray fluticasone 50 mcg/actuation nasal spray,suspension    [provider]  ibuprofen (ADVIL,MOTRIN) 200 MG tablet Take 3 tablets (600 mg total) by mouth every 6 (six) hours as needed for pain. 08/07/13   Meisinger, Tawanna Coolerodd, MD  ibuprofen (ADVIL,MOTRIN) 800 MG tablet  01/05/18   [provider]  levocetirizine (XYZAL) 5 MG tablet levocetirizine 5 mg tablet    [provider]  levonorgestrel (MIRENA) 20 MCG/24HR IUD 1 each by Intrauterine route once. Inserted 08/2013    [provider]  mometasone (ELOCON) 0.1 % cream mometasone 0.1 % topical cream    [provider]  montelukast (SINGULAIR) 10 MG tablet Take 10 mg by mouth daily. 01/17/18   [provider]  ranitidine (ZANTAC) 150 MG tablet Take 150 mg by mouth 2 (two) times daily.    [provider]    Family History Family History  Problem Relation Age of Onset  . Hypertension Mother   . Hyperlipidemia Mother   . Depression Sister   . Heart disease Maternal Grandmother   . Heart disease Maternal Grandfather   . Heart disease Paternal Grandmother   . Heart disease Paternal Grandfather   . Diabetes Paternal Aunt   .  Diabetes Paternal Uncle   . Breast cancer Neg Hx   . Colon cancer Neg Hx     Social History Social History   Tobacco Use  . Smoking status: Never Smoker  . Smokeless tobacco: Never Used  Substance Use Topics  . Alcohol use: No  . Drug use: No     Allergies   Cephalosporins; Peanut-containing drug products; Sulfamethoxazole-trimethoprim; and Zofran   Review of Systems Review of Systems  Constitutional:       Per HPI, otherwise negative  HENT:       Per HPI, otherwise negative  Respiratory:       Per HPI, otherwise negative  Cardiovascular:       Per HPI, otherwise negative  Gastrointestinal: Positive for  abdominal pain and nausea. Negative for vomiting.  Endocrine:       Negative aside from HPI  Genitourinary:       Neg aside from HPI   Musculoskeletal:       Per HPI, otherwise negative  Skin: Negative.   Neurological: Negative for syncope.     Physical Exam Updated Vital Signs BP 106/76 (BP Location: Left Arm)   Pulse 86   Temp 98.1 F (36.7 C) (Oral)   Resp 16   SpO2 95%   Physical Exam  Constitutional: She is oriented to person, place, and time. She appears well-developed and well-nourished. No distress.  HENT:  Head: Normocephalic and atraumatic.  Eyes: Conjunctivae and EOM are normal.  Cardiovascular: Normal rate and regular rhythm.  Pulmonary/Chest: Effort normal and breath sounds normal. No stridor. No respiratory distress.  Abdominal: She exhibits no distension. There is generalized tenderness and tenderness in the left upper quadrant. There is guarding.  Musculoskeletal: She exhibits no edema.  Neurological: She is alert and oriented to person, place, and time. No cranial nerve deficit.  Skin: Skin is warm and dry.  Psychiatric: She has a normal mood and affect.  Nursing note and vitals reviewed.    ED Treatments / Results  Labs (all labs ordered are listed, but only abnormal results are displayed) Labs Reviewed  URINALYSIS, ROUTINE W REFLEX MICROSCOPIC - Abnormal; Notable for the following components:      Result Value   Hgb urine dipstick LARGE (*)    Bacteria, UA MANY (*)    Squamous Epithelial / LPF 0-5 (*)    All other components within normal limits  LIPASE, BLOOD  COMPREHENSIVE METABOLIC PANEL  CBC  POC URINE PREG, ED    Radiology Ct Renal Stone Study  Result Date: 02/21/2018 CLINICAL DATA:  Acute left-sided abdominal pain. EXAM: CT ABDOMEN AND PELVIS WITHOUT CONTRAST TECHNIQUE: Multidetector CT imaging of the abdomen and pelvis was performed following the standard protocol without IV contrast. COMPARISON:  CT scan of August 18, 2004.  FINDINGS: Lower chest: No acute abnormality. Hepatobiliary: No focal liver abnormality is seen. No gallstones, gallbladder wall thickening, or biliary dilatation. Pancreas: Unremarkable. No pancreatic ductal dilatation or surrounding inflammatory changes. Spleen: Normal in size without focal abnormality. Adrenals/Urinary Tract: Adrenal glands are unremarkable. Kidneys are normal, without renal calculi, focal lesion, or hydronephrosis. Bladder is unremarkable. Stomach/Bowel: Stomach is within normal limits. Appendix appears normal. No evidence of bowel wall thickening, distention, or inflammatory changes. Diverticulosis of descending and sigmoid colon is noted without inflammation. Vascular/Lymphatic: No significant vascular findings are present. No enlarged abdominal or pelvic lymph nodes. Reproductive: Intrauterine device is noted. No adnexal abnormality is noted. Other: No abdominal wall hernia or abnormality. No abdominopelvic ascites. Musculoskeletal: No  acute or significant osseous findings. IMPRESSION: Diverticulosis of descending and sigmoid colon is noted without inflammation. No acute abnormality seen in the abdomen or pelvis. Electronically Signed   By: Lupita Raider, M.D.   On: 02/21/2018 12:24    Procedures Procedures (including critical care time)  Medications Ordered in ED Medications  sodium chloride 0.9 % bolus 1,000 mL (1,000 mLs Intravenous New Bag/Given 02/21/18 1155)  ketorolac (TORADOL) 30 MG/ML injection 15 mg (15 mg Intravenous Given 02/21/18 1152)  gi cocktail (Maalox,Lidocaine,Donnatal) (30 mLs Oral Given 02/21/18 1152)     Initial Impression / Assessment and Plan / ED Course  I have reviewed the triage vital signs and the nursing notes.  Pertinent labs & imaging results that were available during my care of the patient were reviewed by me and considered in my medical decision making (see chart for details).     1:24 PM Patient is awake and alert, in no distress on  repeat exam. With her husband present we discussed all findings including reassuring CT scan, labs. With  hematuria, there is some suspicion for recently passed kidney stone, though gastric etiology remains more likely. Patient will be started on medication for gastric irritation, will follow up with primary care for further evaluation and management. Absent notable findings on CT scan, labs, and with normal vitals, non-peritoneal physical exam, patient is appropriate for close outpatient follow-up.  Final Clinical Impressions(s) / ED Diagnoses   Abdominal pain   Cindy Munch, MD 02/21/18 1325

## 2018-02-21 NOTE — Patient Instructions (Signed)
Given the severity of your symptoms, I STRONGLY recommend that you go to the emergency room.

## 2018-02-21 NOTE — ED Triage Notes (Addendum)
Pt complains of abdominal pain, back pain, nausea, emesis x 3 days. Pt denies fever or urinary symptoms, states she has felt chills.

## 2018-02-21 NOTE — Progress Notes (Signed)
[name] is a 32 y.o. female here for a new problem.  I acted as a Neurosurgeonscribe for [employer] Read [employer], PA-C Corky Mullonna Orphanos, LPN  History of Present Illness:   Chief Complaint  Patient presents with  . Abdominal Pain  . Back Pain    Abdominal Pain  This is a new problem. Episode onset: Started on Friday. The onset quality is sudden. The problem occurs constantly. The problem has been gradually worsening. The pain is located in the generalized abdominal region (and low back ). The pain is at a severity of 10/10. The quality of the pain is cramping. Associated symptoms include headaches, nausea and vomiting. Pertinent negatives include no belching, constipation, dysuria or fever. Associated symptoms comments: Loose stools, vomiting 3 times a day unable to keep anything down other than water.. The pain is aggravated by movement and eating. The pain is relieved by nothing. Treatments tried: Pepto bismol x 1  The treatment provided no relief.   Has not been able to keep any food down since Friday. She is having severe abdominal pain that starts in the middle of her stomach, radiates to her back and up her shoulder blades. She was recently prescribed an antibiotic a few days ago for a sinus infection (Doxycycline) but she has not been able to take it to keep it down. She is unable to walk without hunching over due to pain. She denies fever but is having chills.  Of note, she had an attack like this when she was pregnant in 2012, but it was not as severe. An ultrasound at that time showed biliary sludge.   Past Medical History:  Diagnosis Date  . Acid reflux   . Allergy   . Anxiety   . Depression   . Hyperlipidemia      Social History   Socioeconomic History  . Marital status: Married    Spouse name: Not on file  . Number of children: Not on file  . Years of education: Not on file  . Highest education level: Not on file  Social Needs  . Financial resource strain: Not on file  . Food  insecurity - worry: Not on file  . Food insecurity - inability: Not on file  . Transportation needs - medical: Not on file  . Transportation needs - non-medical: Not on file  Occupational History  . Not on file  Tobacco Use  . Smoking status: Never Smoker  . Smokeless tobacco: Never Used  Substance and Sexual Activity  . Alcohol use: No  . Drug use: No  . Sexual activity: Yes    Birth control/protection: None  Other Topics Concern  . Not on file  Social History Narrative   Married with 2 kids -- daughters   Works at [employer] and [employer] (2nd shift)   Live in [place]    Past Surgical History:  Procedure Laterality Date  . PERIPHERALLY INSERTED CENTRAL CATHETER INSERTION    . WISDOM TOOTH EXTRACTION    . WISDOM TOOTH EXTRACTION Bilateral     Family History  Problem Relation Age of Onset  . Hypertension Mother   . Hyperlipidemia Mother   . Depression Sister   . Heart disease Maternal Grandmother   . Heart disease Maternal Grandfather   . Heart disease Paternal Grandmother   . Heart disease Paternal Grandfather   . Diabetes Paternal Aunt   . Diabetes Paternal Uncle   . Breast cancer Neg Hx   . Colon cancer Neg Hx  Allergies  Allergen Reactions  . Cephalosporins Nausea And Vomiting and Rash  . Peanut-Containing Drug Products Other (See Comments)    Reaction unknown  . Sulfamethoxazole-Trimethoprim   . Zofran Nausea And Vomiting and Other (See Comments)    Chest pain and nose bleeds    Current Medications:   Current Outpatient Medications:  .  Azelastine-Fluticasone (DYMISTA) 137-50 MCG/ACT SUSP, Dymista 137 mcg-50 mcg/spray nasal spray 1 spray each nare twice a day, Disp: , Rfl:  .  citalopram (CELEXA) 40 MG tablet, Take 1 tablet (40 mg total) by mouth daily., Disp: 90 tablet, Rfl: 1 .  doxycycline (VIBRA-TABS) 100 MG tablet, Take 100 mg by mouth 2 (two) times daily. , Disp: , Rfl:  .  fluticasone (FLONASE) 50 MCG/ACT nasal spray, fluticasone 50 mcg/actuation  nasal spray,suspension, Disp: , Rfl:  .  ibuprofen (ADVIL,MOTRIN) 200 MG tablet, Take 3 tablets (600 mg total) by mouth every 6 (six) hours as needed for pain., Disp: 30 tablet, Rfl: 0 .  ibuprofen (ADVIL,MOTRIN) 800 MG tablet, , Disp: , Rfl:  .  levocetirizine (XYZAL) 5 MG tablet, levocetirizine 5 mg tablet, Disp: , Rfl:  .  levonorgestrel (MIRENA) 20 MCG/24HR IUD, 1 each by Intrauterine route once. Inserted 08/2013, Disp: , Rfl:  .  mometasone (ELOCON) 0.1 % cream, mometasone 0.1 % topical cream, Disp: , Rfl:  .  montelukast (SINGULAIR) 10 MG tablet, Take 10 mg by mouth daily., Disp: , Rfl: 1 .  ranitidine (ZANTAC) 150 MG tablet, Take 150 mg by mouth 2 (two) times daily., Disp: , Rfl:    Review of Systems:   Review of Systems  Constitutional: Negative for fever.  Gastrointestinal: Positive for abdominal pain, nausea and vomiting. Negative for constipation.  Genitourinary: Negative for dysuria.  Neurological: Positive for headaches.    Vitals:   Vitals:   02/21/18 0916  BP: 110/84  Pulse: 80  Temp: 98.1 F (36.7 C)  TempSrc: Oral  SpO2: 97%  Weight: 204 lb 8 oz (92.8 kg)  Height: 5' 3.75" (1.619 m)     Body mass index is 35.38 kg/m.  Physical Exam:   Physical Exam  Constitutional: She appears well-developed and well-nourished. She is cooperative.  Non-toxic appearance. She does not have a sickly appearance. She does not appear ill. No distress.  Cardiovascular: Normal rate, regular rhythm, S1 normal, S2 normal, normal heart sounds and normal pulses.  No LE edema  Pulmonary/Chest: Effort normal and breath sounds normal.  Abdominal: Normal appearance. Bowel sounds are decreased. There is generalized tenderness. There is rebound and guarding. There is no rigidity.  Neurological: She is alert. GCS eye subscore is 4. GCS verbal subscore is 5. GCS motor subscore is 6.  Skin: Skin is warm, dry and intact.  Psychiatric: She has a normal mood and affect. Her speech is normal and  behavior is normal.  Nursing note and vitals reviewed.   Assessment and Plan:    Zandria was seen today for abdominal pain and back pain.  Diagnoses and all orders for this visit:  Generalized abdominal pain   Given severity of symptoms, inability to tolerate POs, and physical exam findings I advised patient that she would best be served in the ED. She is agreeable to this. I offered EMS, she declined. Her husband picked her up from our office and reports that he is going to transport her to the ED.  Marland Kitchen Reviewed expectations re: course of current medical issues. . Discussed self-management of symptoms. . Outlined signs and symptoms  indicating need for more acute intervention. . Patient verbalized understanding and all questions were answered. . See orders for this visit as documented in the electronic medical record. . Patient received an After-Visit Summary.  CMA or LPN served as scribe during this visit. History, Physical, and Plan performed by medical provider. Documentation and orders reviewed and attested to.  Jarold Motto, PA-C

## 2018-03-01 ENCOUNTER — Encounter: Payer: Self-pay | Admitting: Physician Assistant

## 2018-03-01 LAB — CBC AND DIFFERENTIAL
HCT: 40 (ref 36–46)
Hemoglobin: 13.5 (ref 12.0–16.0)
Platelets: 254 (ref 150–399)
WBC: 10

## 2018-03-01 LAB — BASIC METABOLIC PANEL
BUN: 19 (ref 4–21)
CREATININE: 0.9 (ref 0.5–1.1)
Glucose: 82
POTASSIUM: 4.2 (ref 3.4–5.3)
Sodium: 138 (ref 137–147)

## 2018-03-01 LAB — HEPATIC FUNCTION PANEL
ALT: 33 (ref 7–35)
AST: 19 (ref 13–35)
Alkaline Phosphatase: 60 (ref 25–125)
Bilirubin, Total: 0.3

## 2018-03-01 LAB — POCT ERYTHROCYTE SEDIMENTATION RATE, NON-AUTOMATED: Sed Rate: 7

## 2018-03-21 ENCOUNTER — Ambulatory Visit: Payer: BC Managed Care – PPO | Admitting: Physician Assistant

## 2018-03-21 ENCOUNTER — Encounter: Payer: Self-pay | Admitting: Physician Assistant

## 2018-03-21 VITALS — BP 110/70 | HR 84 | Temp 98.2°F | Ht 63.75 in | Wt 209.0 lb

## 2018-03-21 DIAGNOSIS — F419 Anxiety disorder, unspecified: Secondary | ICD-10-CM | POA: Diagnosis not present

## 2018-03-21 DIAGNOSIS — F330 Major depressive disorder, recurrent, mild: Secondary | ICD-10-CM | POA: Diagnosis not present

## 2018-03-21 MED ORDER — BUPROPION HCL 75 MG PO TABS: 75.0000 mg | ORAL_TABLET | Freq: Two times a day (BID) | ORAL | 1 refills | Status: DC

## 2018-03-21 NOTE — Patient Instructions (Signed)
It was great to see you!  Continue Celexa at your current dosage.  Start 75 mg Wellbutrin twice daily, once in AM and once in PM.  Follow-up with us in 6 weeks to see how this medication dosage is doing, sooner if needed!

## 2018-03-21 NOTE — Assessment & Plan Note (Signed)
GAD-7 increased from 8 to 12 since last visit. She feels as though she is struggling more with depression than anxiety, she is agreeable to adding Wellbutrin today, we have added 75 mg BID. Follow-up in 6 weeks, sooner if any issues.

## 2018-03-21 NOTE — Assessment & Plan Note (Signed)
PHQ-9 increased from 8 to 15 since last visit. She feels as though she is struggling more with depression than anxiety, she is agreeable to adding Wellbutrin today, we have added 75 mg BID. Follow-up in 6 weeks, sooner if any issues.

## 2018-03-21 NOTE — Progress Notes (Signed)
[name] is a 32 y.o. female is here to discuss: Depression and anxiety.    History of Present Illness:   Chief Complaint  Patient presents with  . Depression  . Anxiety    Depression         This is a chronic problem.  The problem occurs constantly.  The most recent episode lasted 1 week.    The problem has been waxing and waning since onset.  Associated symptoms include decreased concentration, fatigue, helplessness, hopelessness, insomnia, irritable, restlessness, decreased interest, appetite change, body aches, myalgias and headaches.  Associated symptoms include no indigestion, not sad and no suicidal ideas.     The symptoms are aggravated by family issues and work stress.  Compliance with treatment is good.  Previous treatment provided mild relief.  Past medical history includes anxiety.   Anxiety  Presents for follow-up visit. Symptoms include decreased concentration, depressed mood, excessive worry, insomnia, irritability, malaise, nervous/anxious behavior and restlessness. Patient reports no chest pain, compulsions, confusion, dizziness, dry mouth, feeling of choking, hyperventilation, muscle tension, nausea, obsessions, palpitations, panic, shortness of breath or suicidal ideas. Symptoms occur constantly. The most recent episode lasted 2 days. The severity of symptoms is moderate, causing significant distress and interfering with daily activities. The quality of sleep is fair. Nighttime awakenings: several.   Compliance with medications is 76-100%. Treatment side effects: No side effects.   January - March had someone staying with her and was very stressful. She is also working with someone that she is not always getting along with. She is continuing to work 2 jobs and her husband works 3.   GAD 7 : Generalized Anxiety Score 03/21/2018 09/20/2017 08/09/2017 06/28/2017  Nervous, Anxious, on Edge 1 1 1 2   Control/stop worrying 2 1 1 3   Worry too much - different things 3 2 2 3    Trouble relaxing 2 2 2 2   Restless 1 1 2 1   Easily annoyed or irritable 3 1 2 3   Afraid - awful might happen 0 0 1 2  Total GAD 7 Score 12 8 11 16   Anxiety Difficulty Somewhat difficult Somewhat difficult - -    [facility] screen [facility] 03/21/2018 09/20/2017 08/09/2017 06/28/2017 06/28/2017  Decreased Interest 1 0 1 3 3   Down, Depressed, Hopeless 2 1 1 2 2   PHQ - 2 Score 3 1 2 5 5   Altered sleeping 2 2 2 3 3   Tired, decreased energy 2 2 2 3 3   Change in appetite 2 0 1 2 2   Feeling bad or failure about yourself  2 1 2 3 3   Trouble concentrating 3 2 1 3 3   Moving slowly or fidgety/restless 1 0 0 2 2  Suicidal thoughts 0 0 0 0 0  PHQ-9 Score 15 8 10 21 21   Difficult doing work/chores Somewhat difficult Somewhat difficult - Very difficult -   Wt Readings from Last 6 Encounters:  03/21/18 209 lb (94.8 kg)  02/21/18 204 lb 8 oz (92.8 kg)  09/20/17 211 lb 4 oz (95.8 kg)  08/09/17 223 lb 6.1 oz (101.3 kg)  06/28/17 239 lb 3.2 oz (108.5 kg)  08/05/13 211 lb (95.7 kg)     Health Maintenance Due  Topic Date Due  . PAP SMEAR  11/16/2006    Past Medical History:  Diagnosis Date  . Acid reflux   . Allergy   . Anxiety   . Depression   . Hyperlipidemia      Social History   Socioeconomic  History  . Marital status: Married    Spouse name: Not on file  . Number of children: Not on file  . Years of education: Not on file  . Highest education level: Not on file  Occupational History  . Not on file  Social Needs  . Financial resource strain: Not on file  . Food insecurity:    Worry: Not on file    Inability: Not on file  . Transportation needs:    Medical: Not on file    Non-medical: Not on file  Tobacco Use  . Smoking status: Never Smoker  . Smokeless tobacco: Never Used  Substance and Sexual Activity  . Alcohol use: No  . Drug use: No  . Sexual activity: Yes    Birth control/protection: None  Lifestyle  . Physical activity:    Days per week: Not on file    Minutes  per session: Not on file  . Stress: Not on file  Relationships  . Social connections:    Talks on phone: Not on file    Gets together: Not on file    Attends religious service: Not on file    Active member of club or organization: Not on file    Attends meetings of clubs or organizations: Not on file    Relationship status: Not on file  . Intimate partner violence:    Fear of current or ex partner: Not on file    Emotionally abused: Not on file    Physically abused: Not on file    Forced sexual activity: Not on file  Other Topics Concern  . Not on file  Social History Narrative   Married with 2 kids -- daughters   Works at [employer] and [employer] (2nd shift)   Live in [place]    Past Surgical History:  Procedure Laterality Date  . PERIPHERALLY INSERTED CENTRAL CATHETER INSERTION    . WISDOM TOOTH EXTRACTION    . WISDOM TOOTH EXTRACTION Bilateral     Family History  Problem Relation Age of Onset  . Hypertension Mother   . Hyperlipidemia Mother   . Depression Sister   . Heart disease Maternal Grandmother   . Heart disease Maternal Grandfather   . Heart disease Paternal Grandmother   . Heart disease Paternal Grandfather   . Diabetes Paternal Aunt   . Diabetes Paternal Uncle   . Breast cancer Neg Hx   . Colon cancer Neg Hx     PMHx, SurgHx, SocialHx, FamHx, Medications, and Allergies were reviewed in the Visit Navigator and updated as appropriate.   Patient Active Problem List   Diagnosis Date Noted  . Rash 09/14/2017  . Depression 06/28/2017  . Acid reflux 06/28/2017  . Allergy 06/28/2017  . Anxiety 06/28/2017    Social History   Tobacco Use  . Smoking status: Never Smoker  . Smokeless tobacco: Never Used  Substance Use Topics  . Alcohol use: No  . Drug use: No    Current Medications and Allergies:    Current Outpatient Medications:  .  Azelastine-Fluticasone (DYMISTA) 137-50 MCG/ACT SUSP, Dymista 137 mcg-50 mcg/spray nasal spray 1 spray each nare twice a  day, Disp: , Rfl:  .  citalopram (CELEXA) 40 MG tablet, Take 1 tablet (40 mg total) by mouth daily., Disp: 90 tablet, Rfl: 1 .  hydroxychloroquine (PLAQUENIL) 200 MG tablet, 2 TABLETS WITH FOOD OR MILK ONCE A DAY, Disp: , Rfl: 2 .  ibuprofen (ADVIL,MOTRIN) 800 MG tablet, , Disp: , Rfl:  .  levocetirizine (XYZAL) 5 MG tablet, levocetirizine 5 mg tablet, Disp: , Rfl:  .  levonorgestrel (MIRENA) 20 MCG/24HR IUD, 1 each by Intrauterine route once. Inserted 01/19/2018, needs to be removed 01/2023., Disp: , Rfl:  .  mometasone (ELOCON) 0.1 % cream, mometasone 0.1 % topical cream, Disp: , Rfl:  .  montelukast (SINGULAIR) 10 MG tablet, Take 10 mg by mouth daily., Disp: , Rfl: 1 .  ranitidine (ZANTAC) 150 MG tablet, Take 150 mg by mouth 2 (two) times daily., Disp: , Rfl:  .  buPROPion (WELLBUTRIN) 75 MG tablet, Take 1 tablet (75 mg total) by mouth 2 (two) times daily., Disp: 60 tablet, Rfl: 1   Allergies  Allergen Reactions  . Cephalosporins Nausea And Vomiting and Rash  . Sulfamethoxazole-Trimethoprim   . Zofran Nausea And Vomiting and Other (See Comments)    Chest pain and nose bleeds    Review of Systems   Review of Systems  Constitutional: Positive for appetite change, fatigue and irritability.  Respiratory: Negative for shortness of breath.   Cardiovascular: Negative for chest pain and palpitations.  Gastrointestinal: Negative for nausea.  Musculoskeletal: Positive for myalgias.  Neurological: Positive for headaches. Negative for dizziness.  Psychiatric/Behavioral: Positive for decreased concentration and depression. Negative for confusion and suicidal ideas. The patient is nervous/anxious and has insomnia.     Vitals:   Vitals:   03/21/18 0855  BP: 110/70  Pulse: 84  Temp: 98.2 F (36.8 C)  TempSrc: Oral  SpO2: 97%  Weight: 209 lb (94.8 kg)  Height: 5' 3.75" (1.619 m)     Body mass index is 36.16 kg/m.   Physical Exam:    Physical Exam  Constitutional: She appears  well-developed. She is irritable and cooperative.  Non-toxic appearance. She does not have a sickly appearance. She does not appear ill. No distress.  Cardiovascular: Normal rate, regular rhythm, S1 normal, S2 normal, normal heart sounds and normal pulses.  No LE edema  Pulmonary/Chest: Effort normal and breath sounds normal.  Neurological: She is alert. GCS eye subscore is 4. GCS verbal subscore is 5. GCS motor subscore is 6.  Skin: Skin is warm, dry and intact.  Psychiatric: She has a normal mood and affect. Her speech is normal and behavior is normal.  Pleasant and cheerful  Nursing note and vitals reviewed.    Assessment and Plan:    Problem List Items Addressed This Visit      Other   Depression    PHQ-9 increased from 8 to 15 since last visit. She feels as though she is struggling more with depression than anxiety, she is agreeable to adding Wellbutrin today, we have added 75 mg BID. Follow-up in 6 weeks, sooner if any issues.      Relevant Medications   buPROPion (WELLBUTRIN) 75 MG tablet   Anxiety - Primary    GAD-7 increased from 8 to 12 since last visit. She feels as though she is struggling more with depression than anxiety, she is agreeable to adding Wellbutrin today, we have added 75 mg BID. Follow-up in 6 weeks, sooner if any issues.      Relevant Medications   buPROPion (WELLBUTRIN) 75 MG tablet      . Reviewed expectations re: course of current medical issues. . Discussed self-management of symptoms. . Outlined signs and symptoms indicating need for more acute intervention. . Patient verbalized understanding and all questions were answered. . See orders for this visit as documented in the electronic medical record. . Patient received an  After Visit Summary.  CMA or LPN served as scribe during this visit. History, Physical, and Plan performed by medical provider. Documentation and orders reviewed and attested to.  Jarold Motto, PA-C [place] [place]  [place] 03/21/2018  Follow-up: No follow-ups on file.

## 2018-04-11 ENCOUNTER — Other Ambulatory Visit: Payer: Self-pay | Admitting: Physician Assistant

## 2018-04-12 ENCOUNTER — Other Ambulatory Visit: Payer: Self-pay | Admitting: Physician Assistant

## 2018-04-13 ENCOUNTER — Ambulatory Visit: Payer: BC Managed Care – PPO | Admitting: Physician Assistant

## 2018-04-13 ENCOUNTER — Encounter: Payer: Self-pay | Admitting: Physician Assistant

## 2018-04-13 VITALS — BP 112/70 | HR 85 | Temp 98.2°F | Ht 63.75 in | Wt 209.2 lb

## 2018-04-13 DIAGNOSIS — M25561 Pain in right knee: Secondary | ICD-10-CM | POA: Diagnosis not present

## 2018-04-13 NOTE — Progress Notes (Signed)
[name] is a 32 y.o. female here for a new problem.  I acted as a Neurosurgeon for [employer], PA-C [name], LPN  History of Present Illness:   Chief Complaint  Patient presents with  . Leg Pain    Leg Pain   Incident onset: Started on Saturday, pt has been going to [employer]umb[employer] 2 x's per week and last week did Zumba 4 days in a row. The incident occurred at the gym. The injury mechanism is unknown. The pain is present in the right leg (from the Knee down into calf). The quality of the pain is described as burning and aching. The pain is at a severity of 6/10. The pain is moderate. Pain course: only when has to put weigh on it. Associated symptoms include an inability to bear weight and muscle weakness. Pertinent negatives include no numbness or tingling. She reports no foreign bodies present. The symptoms are aggravated by movement and weight bearing. She has tried NSAIDs and elevation for the symptoms. The treatment provided mild relief.   Wt Readings from Last 5 Encounters:  04/13/18 209 lb 4 oz (94.9 kg)  03/21/18 209 lb (94.8 kg)  02/21/18 204 lb 8 oz (92.8 kg)  09/20/17 211 lb 4 oz (95.8 kg)  08/09/17 223 lb 6.1 oz (101.3 kg)      Past Medical History:  Diagnosis Date  . Acid reflux   . Allergy   . Anxiety   . Depression   . Hyperlipidemia      Social History   Socioeconomic History  . Marital status: Married    Spouse name: Not on file  . Number of children: Not on file  . Years of education: Not on file  . Highest education level: Not on file  Occupational History  . Not on file  Social Needs  . Financial resource strain: Not on file  . Food insecurity:    Worry: Not on file    Inability: Not on file  . Transportation needs:    Medical: Not on file    Non-medical: Not on file  Tobacco Use  . Smoking status: Never Smoker  . Smokeless tobacco: Never Used  Substance and Sexual Activity  . Alcohol use: No  . Drug use: No  . Sexual activity: Yes   Birth control/protection: None  Lifestyle  . Physical activity:    Days per week: Not on file    Minutes per session: Not on file  . Stress: Not on file  Relationships  . Social connections:    Talks on phone: Not on file    Gets together: Not on file    Attends religious service: Not on file    Active member of club or organization: Not on file    Attends meetings of clubs or organizations: Not on file    Relationship status: Not on file  . Intimate partner violence:    Fear of current or ex partner: Not on file    Emotionally abused: Not on file    Physically abused: Not on file    Forced sexual activity: Not on file  Other Topics Concern  . Not on file  Social History Narrative   Married with 2 kids -- daughters   Works at [employer] and [employer] (2nd shift)   Live in [place]    Past Surgical History:  Procedure Laterality Date  . PERIPHERALLY INSERTED CENTRAL CATHETER INSERTION    . WISDOM TOOTH EXTRACTION    . WISDOM  TOOTH EXTRACTION Bilateral     Family History  Problem Relation Age of Onset  . Hypertension Mother   . Hyperlipidemia Mother   . Depression Sister   . Heart disease Maternal Grandmother   . Heart disease Maternal Grandfather   . Heart disease Paternal Grandmother   . Heart disease Paternal Grandfather   . Diabetes Paternal Aunt   . Diabetes Paternal Uncle   . Breast cancer Neg Hx   . Colon cancer Neg Hx     Allergies  Allergen Reactions  . Cephalosporins Nausea And Vomiting and Rash  . Sulfamethoxazole-Trimethoprim   . Zofran Nausea And Vomiting and Other (See Comments)    Chest pain and nose bleeds    Current Medications:   Current Outpatient Medications:  .  Azelastine-Fluticasone (DYMISTA) 137-50 MCG/ACT SUSP, Dymista 137 mcg-50 mcg/spray nasal spray 1 spray each nare twice a day, Disp: , Rfl:  .  buPROPion (WELLBUTRIN) 75 MG tablet, TAKE 1 TABLET BY MOUTH TWICE A DAY, Disp: 180 tablet, Rfl: 0 .  citalopram (CELEXA) 40 MG tablet, TAKE 1  TABLET BY MOUTH EVERY DAY, Disp: 90 tablet, Rfl: 0 .  hydroxychloroquine (PLAQUENIL) 200 MG tablet, 2 TABLETS WITH FOOD OR MILK ONCE A DAY, Disp: , Rfl: 2 .  ibuprofen (ADVIL,MOTRIN) 800 MG tablet, , Disp: , Rfl:  .  levocetirizine (XYZAL) 5 MG tablet, levocetirizine 5 mg tablet, Disp: , Rfl:  .  levonorgestrel (MIRENA) 20 MCG/24HR IUD, 1 each by Intrauterine route once. Inserted 01/19/2018, needs to be removed 01/2023., Disp: , Rfl:  .  mometasone (ELOCON) 0.1 % cream, mometasone 0.1 % topical cream, Disp: , Rfl:  .  montelukast (SINGULAIR) 10 MG tablet, Take 10 mg by mouth daily., Disp: , Rfl: 1 .  ranitidine (ZANTAC) 150 MG tablet, Take 150 mg by mouth 2 (two) times daily., Disp: , Rfl:    Review of Systems:   Review of Systems  Neurological: Negative for tingling and numbness.  Negative unless otherwise specified per HPI.  Vitals:   Vitals:   04/13/18 1422  BP: 112/70  Pulse: 85  Temp: 98.2 F (36.8 C)  TempSrc: Oral  SpO2: 98%  Weight: 209 lb 4 oz (94.9 kg)  Height: 5' 3.75" (1.619 m)     Body mass index is 36.2 kg/m.  Physical Exam:   Physical Exam  Constitutional: She appears well-developed and well-nourished. She is cooperative.  Non-toxic appearance. She does not have a sickly appearance. She does not appear ill. No distress.  HENT:  Head: Normocephalic and atraumatic.  Cardiovascular: Normal rate, regular rhythm and normal heart sounds.  Pulses:      Dorsalis pedis pulses are 2+ on the right side, and 2+ on the left side.       Posterior tibial pulses are 2+ on the right side, and 2+ on the left side.  Pulmonary/Chest: Effort normal and breath sounds normal. No accessory muscle usage. No respiratory distress.  Musculoskeletal:  Tenderness to lateral portion of R knee. Pain with extension. Slight decreased ROM with extension of knee. No laxity noted. Negative anterior drawer.  Neurological: She is alert.  Normal sensation to bilateral LE  Skin: Skin is warm, dry  and intact.  Psychiatric: She has a normal mood and affect. Her speech is normal.  Nursing note and vitals reviewed.   Assessment and Plan:    [name] was seen today for leg pain.  Diagnoses and all orders for this visit:  Acute pain of right knee  Suspect muscle strain, possible LCL. Use ACE wrap for compression, elevate and use ice. Alternate tylenol and ibuprofen. Follow-up if symptoms worsen or persist despite treatment.  . Reviewed expectations re: course of current medical issues. . Discussed self-management of symptoms. . Outlined signs and symptoms indicating need for more acute intervention. . Patient verbalized understanding and all questions were answered. . See orders for this visit as documented in the electronic medical record. . Patient received an After-Visit Summary.  CMA or LPN served as scribe during this visit. History, Physical, and Plan performed by medical provider. Documentation and orders reviewed and attested to.  Jarold Motto, PA-C

## 2018-04-13 NOTE — Patient Instructions (Addendum)
It was great to see you!  You may alternate ibuprofen and tylenol for the pain. Rest and keep area compressed with the ACE wrap. Follow-up if symptoms do not improve or worsen.

## 2018-05-02 ENCOUNTER — Ambulatory Visit: Payer: BC Managed Care – PPO | Admitting: Physician Assistant

## 2018-05-02 ENCOUNTER — Encounter: Payer: Self-pay | Admitting: Physician Assistant

## 2018-05-02 VITALS — BP 102/70 | HR 77 | Temp 98.4°F | Ht 63.75 in | Wt 203.4 lb

## 2018-05-02 DIAGNOSIS — F419 Anxiety disorder, unspecified: Secondary | ICD-10-CM

## 2018-05-02 DIAGNOSIS — F330 Major depressive disorder, recurrent, mild: Secondary | ICD-10-CM | POA: Diagnosis not present

## 2018-05-02 MED ORDER — BUPROPION HCL ER (SR) 150 MG PO TB12: 150.0000 mg | ORAL_TABLET | Freq: Two times a day (BID) | ORAL | 2 refills | Status: DC

## 2018-05-02 NOTE — Patient Instructions (Signed)
It was great to see you!  Let's follow-up in 6 months, sooner if needed :)  Have a great 3-day weekend!

## 2018-05-02 NOTE — Progress Notes (Signed)
[name] is a 32 y.o. female is here to discuss: Anxiety and Depression  I acted as a Neurosurgeon for [employer], PA-C Molinda Bailiff, LPN  History of Present Illness:   Chief Complaint  Patient presents with  . Anxiety  . Depression    Anxiety  Presents for follow-up visit. Symptoms include excessive worry, insomnia, irritability, malaise and nervous/anxious behavior. Patient reports no chest pain, compulsions, confusion, decreased concentration, depressed mood, dizziness, dry mouth, feeling of choking, hyperventilation, muscle tension, nausea, obsessions, palpitations, panic, restlessness, shortness of breath or suicidal ideas. Symptoms occur most days. The most recent episode lasted 2 days. The severity of symptoms is mild and causing significant distress. The quality of sleep is good. Nighttime awakenings: 3 times a week wakes up one to two times during sleep.   Compliance with medications is 76-100%. Treatment side effects: None, tolerating medicatona well.  Depression         This is a chronic problem.  The problem occurs every several days.  The most recent episode lasted 1 week.    The problem has been gradually improving since onset.  Associated symptoms include fatigue, insomnia and irritable.  Associated symptoms include no decreased concentration, no helplessness, no hopelessness, no restlessness, no decreased interest, no appetite change, no body aches, no myalgias, no headaches, no indigestion, not sad and no suicidal ideas.     The symptoms are aggravated by family issues, work stress and social issues.  Previous treatment provided moderate relief.  Past medical history includes anxiety.    She reports that she is doing well, she is interested in increasing the dose to see if she can get more benefit out of the medication.  She is continuing to try to make time for herself.  She is continue to lose weight.  Wt Readings from Last 5 Encounters:  05/02/18 203 lb 6.1 oz  (92.3 kg)  04/13/18 209 lb 4 oz (94.9 kg)  03/21/18 209 lb (94.8 kg)  02/21/18 204 lb 8 oz (92.8 kg)  09/20/17 211 lb 4 oz (95.8 kg)     GAD 7 : Generalized Anxiety Score 05/02/2018 03/21/2018 09/20/2017 08/09/2017  Nervous, Anxious, on Edge Control/stop worrying Worry too much - different things Trouble relaxing Restless Easily annoyed or irritable Afraid - awful might happen 0 0 0 1  Total GAD 7 Score Anxiety Difficulty Somewhat difficult Somewhat difficult Somewhat difficult -    Depression screen [facility] 2/9 05/02/2018 03/21/2018 09/20/2017 08/09/2017 06/28/2017  Decreased Interest 1 1 0 1 3  Down, Depressed, Hopeless PHQ - 2 Score Altered sleeping Tired, decreased energy Change in appetite 0 2 0 1 2  Feeling bad or failure about yourself  Trouble concentrating Moving slowly or fidgety/restless 0 1 0 0 2  Suicidal thoughts 0 0 0 0 0  PHQ-9 Score Difficult doing work/chores Somewhat difficult Somewhat difficult Somewhat difficult - Very difficult    There are no preventive care reminders to display for this patient.  Past Medical History:  Diagnosis Date  . Acid reflux   .  Allergy   . Anxiety   . Depression   . Hyperlipidemia      Social History   Socioeconomic History  . Marital status: Married    Spouse name: Not on file  . Number of children: Not on file  . Years of education: Not on file  . Highest education level: Not on file  Occupational History  . Not on file  Social Needs  . Financial resource strain: Not on file  . Food insecurity:    Worry: Not on file    Inability: Not on file  . Transportation needs:    Medical: Not on file    Non-medical: Not on file  Tobacco Use  . Smoking status: Never Smoker  . Smokeless tobacco: Never Used  Substance and Sexual Activity  . Alcohol use: No  . Drug use: No  . Sexual  activity: Yes    Birth control/protection: None  Lifestyle  . Physical activity:    Days per week: Not on file    Minutes per session: Not on file  . Stress: Not on file  Relationships  . Social connections:    Talks on phone: Not on file    Gets together: Not on file    Attends religious service: Not on file    Active member of club or organization: Not on file    Attends meetings of clubs or organizations: Not on file    Relationship status: Not on file  . Intimate partner violence:    Fear of current or ex partner: Not on file    Emotionally abused: Not on file    Physically abused: Not on file    Forced sexual activity: Not on file  Other Topics Concern  . Not on file  Social History Narrative   Married with 2 kids -- daughters   Works at [employer] and [employer] (2nd shift)   Live in [place]    Past Surgical History:  Procedure Laterality Date  . PERIPHERALLY INSERTED CENTRAL CATHETER INSERTION    . WISDOM TOOTH EXTRACTION    . WISDOM TOOTH EXTRACTION Bilateral     Family History  Problem Relation Age of Onset  . Hypertension Mother   . Hyperlipidemia Mother   . Depression Sister   . Heart disease Maternal Grandmother   . Heart disease Maternal Grandfather   . Heart disease Paternal Grandmother   . Heart disease Paternal Grandfather   . Diabetes Paternal Aunt   . Diabetes Paternal Uncle   . Breast cancer Neg Hx   . Colon cancer Neg Hx     PMHx, SurgHx, SocialHx, FamHx, Medications, and Allergies were reviewed in the Visit Navigator and updated as appropriate.   Patient Active Problem List   Diagnosis Date Noted  . Rash 09/14/2017  . Depression 06/28/2017  . Acid reflux 06/28/2017  . Allergy 06/28/2017  . Anxiety 06/28/2017    Social History   Tobacco Use  . Smoking status: Never Smoker  . Smokeless tobacco: Never Used  Substance Use Topics  . Alcohol use: No  . Drug use: No    Current Medications and Allergies:    Current Outpatient  Medications:  .  Azelastine-Fluticasone (DYMISTA) 137-50 MCG/ACT SUSP, Dymista 137 mcg-50 mcg/spray nasal spray 1 spray each nare twice a day, Disp: , Rfl:  .  citalopram (CELEXA) 40 MG tablet, TAKE 1 TABLET BY MOUTH EVERY DAY, Disp: 90 tablet, Rfl: 0 .  hydroxychloroquine (PLAQUENIL) 200 MG tablet, 2 TABLETS WITH FOOD OR  MILK ONCE A DAY, Disp: , Rfl: 2 .  ibuprofen (ADVIL,MOTRIN) 800 MG tablet, , Disp: , Rfl:  .  levocetirizine (XYZAL) 5 MG tablet, levocetirizine 5 mg tablet, Disp: , Rfl:  .  levonorgestrel (MIRENA) 20 MCG/24HR IUD, 1 each by Intrauterine route once. Inserted 01/19/2018, needs to be removed 01/2023., Disp: , Rfl:  .  mometasone (ELOCON) 0.1 % cream, mometasone 0.1 % topical cream, Disp: , Rfl:  .  montelukast (SINGULAIR) 10 MG tablet, Take 10 mg by mouth daily., Disp: , Rfl: 1 .  ranitidine (ZANTAC) 150 MG tablet, Take 150 mg by mouth 2 (two) times daily., Disp: , Rfl:  .  buPROPion (WELLBUTRIN SR) 150 MG 12 hr tablet, Take 1 tablet (150 mg total) by mouth 2 (two) times daily., Disp: 120 tablet, Rfl: 2   Allergies  Allergen Reactions  . Cephalosporins Nausea And Vomiting and Rash  . Sulfamethoxazole-Trimethoprim   . Zofran Nausea And Vomiting and Other (See Comments)    Chest pain and nose bleeds    Review of Systems   Review of Systems  Constitutional: Positive for fatigue and irritability. Negative for appetite change.  Respiratory: Negative for shortness of breath.   Cardiovascular: Negative for chest pain and palpitations.  Gastrointestinal: Negative for nausea.  Musculoskeletal: Negative for myalgias.  Neurological: Negative for dizziness and headaches.  Psychiatric/Behavioral: Positive for depression. Negative for confusion, decreased concentration and suicidal ideas. The patient is nervous/anxious and has insomnia.     Vitals:   Vitals:   05/02/18 0749  BP: 102/70  Pulse: 77  Temp: 98.4 F (36.9 C)  TempSrc: Oral  SpO2: 97%  Weight: 203 lb 6.1 oz (92.3  kg)  Height: 5' 3.75" (1.619 m)     Body mass index is 35.18 kg/m.   Physical Exam:    Physical Exam  Constitutional: She appears well-developed. She is irritable and cooperative.  Non-toxic appearance. She does not have a sickly appearance. She does not appear ill. No distress.  Cardiovascular: Normal rate, regular rhythm, S1 normal, S2 normal, normal heart sounds and normal pulses.  No LE edema  Pulmonary/Chest: Effort normal and breath sounds normal.  Neurological: She is alert. GCS eye subscore is 4. GCS verbal subscore is 5. GCS motor subscore is 6.  Skin: Skin is warm, dry and intact.  Psychiatric: She has a normal mood and affect. Her speech is normal and behavior is normal.  Nursing note and vitals reviewed.     Assessment and Plan:    Elvia was seen today for anxiety and depression.  Diagnoses and all orders for this visit:  Anxiety and Mild episode of recurrent major depressive disorder (HCC) GAD-7 and PHQ-9 both improved today.  Continue current regimen of Celexa 40 mg and increase Wellbutrin from 75 mg twice a day to 150 mg twice a day.  Follow-up in 6 months, sooner if needed.   Other orders -     buPROPion (WELLBUTRIN SR) 150 MG 12 hr tablet; Take 1 tablet (150 mg total) by mouth 2 (two) times daily.    . Reviewed expectations re: course of current medical issues. . Discussed self-management of symptoms. . Outlined signs and symptoms indicating need for more acute intervention. . Patient verbalized understanding and all questions were answered. . See orders for this visit as documented in the electronic medical record. . Patient received an After Visit Summary.  CMA or LPN served as scribe during this visit. History, Physical, and Plan performed by medical provider. Documentation and  orders reviewed and attested to.  Jarold Motto, PA-C [facility] 05/02/2018  Follow-up: Return in about 6 months (around 11/02/2018) for Medication  F/U.

## 2018-06-09 LAB — CBC AND DIFFERENTIAL
HCT: 41 (ref 36–46)
HEMOGLOBIN: 13.9 (ref 12.0–16.0)
PLATELETS: 291 (ref 150–399)
WBC: 9.7

## 2018-06-09 LAB — BASIC METABOLIC PANEL
BUN: 10 (ref 4–21)
Creatinine: 0.8 (ref 0.5–1.1)
GLUCOSE: 75
POTASSIUM: 4.1 (ref 3.4–5.3)
Sodium: 137 (ref 137–147)

## 2018-06-09 LAB — HEPATIC FUNCTION PANEL
ALT: 24 (ref 7–35)
AST: 19 (ref 13–35)
Alkaline Phosphatase: 58 (ref 25–125)
Bilirubin, Total: 0.3

## 2018-06-29 ENCOUNTER — Encounter: Payer: Self-pay | Admitting: Physician Assistant

## 2018-06-29 ENCOUNTER — Ambulatory Visit (INDEPENDENT_AMBULATORY_CARE_PROVIDER_SITE_OTHER): Payer: BC Managed Care – PPO | Admitting: Physician Assistant

## 2018-06-29 VITALS — BP 110/74 | HR 76 | Temp 98.2°F | Ht 64.0 in | Wt 207.2 lb

## 2018-06-29 DIAGNOSIS — F419 Anxiety disorder, unspecified: Secondary | ICD-10-CM

## 2018-06-29 DIAGNOSIS — Z13220 Encounter for screening for lipoid disorders: Secondary | ICD-10-CM

## 2018-06-29 DIAGNOSIS — Z0001 Encounter for general adult medical examination with abnormal findings: Secondary | ICD-10-CM | POA: Diagnosis not present

## 2018-06-29 DIAGNOSIS — Z136 Encounter for screening for cardiovascular disorders: Secondary | ICD-10-CM | POA: Diagnosis not present

## 2018-06-29 DIAGNOSIS — M542 Cervicalgia: Secondary | ICD-10-CM

## 2018-06-29 DIAGNOSIS — R5383 Other fatigue: Secondary | ICD-10-CM

## 2018-06-29 DIAGNOSIS — M1990 Unspecified osteoarthritis, unspecified site: Secondary | ICD-10-CM

## 2018-06-29 DIAGNOSIS — E669 Obesity, unspecified: Secondary | ICD-10-CM

## 2018-06-29 DIAGNOSIS — F330 Major depressive disorder, recurrent, mild: Secondary | ICD-10-CM

## 2018-06-29 LAB — LIPID PANEL
CHOL/HDL RATIO: 3
Cholesterol: 226 mg/dL — ABNORMAL HIGH (ref 0–200)
HDL: 70.1 mg/dL (ref >39.00)
LDL CALC: 145 mg/dL — AB (ref 0–99)
NONHDL: 155.5
TRIGLYCERIDES: 52 mg/dL (ref 0.0–149.0)
VLDL: 10.4 mg/dL (ref 0.0–40.0)

## 2018-06-29 LAB — COMPREHENSIVE METABOLIC PANEL
ALT: 24 U/L (ref 0–35)
AST: 22 U/L (ref 0–37)
Albumin: 4.3 g/dL (ref 3.5–5.2)
Alkaline Phosphatase: 45 U/L (ref 39–117)
BILIRUBIN TOTAL: 0.6 mg/dL (ref 0.2–1.2)
BUN: 7 mg/dL (ref 6–23)
CALCIUM: 9.5 mg/dL (ref 8.4–10.5)
CHLORIDE: 103 meq/L (ref 96–112)
CO2: 28 meq/L (ref 19–32)
CREATININE: 0.93 mg/dL (ref 0.40–1.20)
GFR: 89.5 mL/min (ref >60.00)
GLUCOSE: 85 mg/dL (ref 70–99)
Potassium: 4.3 mEq/L (ref 3.5–5.1)
SODIUM: 137 meq/L (ref 135–145)
Total Protein: 7.4 g/dL (ref 6.0–8.3)

## 2018-06-29 LAB — CBC WITH DIFFERENTIAL/PLATELET
BASOS ABS: 0 10*3/uL (ref 0.0–0.1)
BASOS PCT: 0.4 % (ref 0.0–3.0)
EOS ABS: 0.1 10*3/uL (ref 0.0–0.7)
Eosinophils Relative: 2 % (ref 0.0–5.0)
HEMATOCRIT: 42.6 % (ref 36.0–46.0)
Hemoglobin: 14.4 g/dL (ref 12.0–15.0)
LYMPHS PCT: 28.1 % (ref 12.0–46.0)
Lymphs Abs: 1.6 10*3/uL (ref 0.7–4.0)
MCHC: 33.9 g/dL (ref 30.0–36.0)
MCV: 100.9 fl — ABNORMAL HIGH (ref 78.0–100.0)
MONO ABS: 0.6 10*3/uL (ref 0.1–1.0)
Monocytes Relative: 10.2 % (ref 3.0–12.0)
NEUTROS ABS: 3.4 10*3/uL (ref 1.4–7.7)
NEUTROS PCT: 59.3 % (ref 43.0–77.0)
PLATELETS: 293 10*3/uL (ref 150.0–400.0)
RBC: 4.22 Mil/uL (ref 3.87–5.11)
RDW: 13.3 % (ref 11.5–15.5)
WBC: 5.8 10*3/uL (ref 4.0–10.5)

## 2018-06-29 LAB — TSH: TSH: 2.33 u[IU]/mL (ref 0.35–4.50)

## 2018-06-29 MED ORDER — CYCLOBENZAPRINE HCL 5 MG PO TABS: 5.0000 mg | ORAL_TABLET | Freq: Three times a day (TID) | ORAL | 0 refills | Status: DC | PRN

## 2018-06-29 MED ORDER — CITALOPRAM HYDROBROMIDE 40 MG PO TABS: 40.0000 mg | ORAL_TABLET | Freq: Every day | ORAL | 1 refills | Status: DC

## 2018-06-29 NOTE — Assessment & Plan Note (Signed)
PHQ-9 has increased from 7 to 13 from last visit. Discussed that at this point, I recommend therapy and/or possible psychiatry evaluation. Patient would like to hold off for now. I discussed with patient that if they develop any SI, to tell someone immediately and seek medical attention.

## 2018-06-29 NOTE — Assessment & Plan Note (Signed)
[facility]. We are requesting records today to view information about visits.
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
## 2018-06-29 NOTE — Patient Instructions (Signed)
It was great to see you!  May use the Flexeril for your neck pain -- if it does not improve, come back and see [place]!  If you need a change in your mental health medications, consider calling psychiatry. If any worsening thoughts or suicidal ideation, please call someone and go to the ER.  We will contact you with your lab results.  Health Maintenance, Female Adopting a healthy lifestyle and getting preventive care can go a long way to promote health and wellness. Talk with your health care provider about what schedule of regular examinations is right for you. This is a good chance for you to check in with your provider about disease prevention and staying healthy. In between checkups, there are plenty of things you can do on your own. Experts have done a lot of research about which lifestyle changes and preventive measures are most likely to keep you healthy. Ask your health care provider for more information. Weight and diet Eat a healthy diet  Be sure to include plenty of vegetables, fruits, low-fat dairy products, and lean protein.  Do not eat a lot of foods high in solid fats, added sugars, or salt.  Get regular exercise. This is one of the most important things you can do for your health. ? Most adults should exercise for at least 150 minutes each week. The exercise should increase your heart rate and make you sweat (moderate-intensity exercise). ? Most adults should also do strengthening exercises at least twice a week. This is in addition to the moderate-intensity exercise.  Maintain a healthy weight  Body mass index (BMI) is a measurement that can be used to identify possible weight problems. It estimates body fat based on height and weight. Your health care provider can help determine your BMI and help you achieve or maintain a healthy weight.  For females 71 years of age and older: ? A BMI below 18.5 is considered underweight. ? A BMI of 18.5 to 24.9 is normal. ? A BMI of 25 to  29.9 is considered overweight. ? A BMI of 30 and above is considered obese.  Watch levels of cholesterol and blood lipids  You should start having your blood tested for lipids and cholesterol at 33 years of age, then have this test every 5 years.  You may need to have your cholesterol levels checked more often if: ? Your lipid or cholesterol levels are high. ? You are older than 33 years of age. ? You are at high risk for heart disease.  Cancer screening Lung Cancer  Lung cancer screening is recommended for adults 99-6 years old who are at high risk for lung cancer because of a history of smoking.  A yearly low-dose CT scan of the lungs is recommended for people who: ? Currently smoke. ? Have quit within the past 15 years. ? Have at least a 30-pack-year history of smoking. A pack year is smoking an average of one pack of cigarettes a day for 1 year.  Yearly screening should continue until it has been 15 years since you quit.  Yearly screening should stop if you develop a health problem that would prevent you from having lung cancer treatment.  Breast Cancer  Practice breast self-awareness. This means understanding how your breasts normally appear and feel.  It also means doing regular breast self-exams. Let your health care provider know about any changes, no matter how small.  If you are in your 20s or 30s, you should have a clinical  breast exam (CBE) by a health care provider every 1-3 years as part of a regular health exam.  If you are 46 or older, have a CBE every year. Also consider having a breast X-ray (mammogram) every year.  If you have a family history of breast cancer, talk to your health care provider about genetic screening.  If you are at high risk for breast cancer, talk to your health care provider about having an MRI and a mammogram every year.  Breast cancer gene (BRCA) assessment is recommended for women who have family members with BRCA-related cancers.  BRCA-related cancers include: ? Breast. ? Ovarian. ? Tubal. ? Peritoneal cancers.  Results of the assessment will determine the need for genetic counseling and BRCA1 and BRCA2 testing.  Cervical Cancer Your health care provider may recommend that you be screened regularly for cancer of the pelvic organs (ovaries, uterus, and vagina). This screening involves a pelvic examination, including checking for microscopic changes to the surface of your cervix (Pap test). You may be encouraged to have this screening done every 3 years, beginning at age 3.  For women ages 55-65, health care providers may recommend pelvic exams and Pap testing every 3 years, or they may recommend the Pap and pelvic exam, combined with testing for human papilloma virus (HPV), every 5 years. Some types of HPV increase your risk of cervical cancer. Testing for HPV may also be done on women of any age with unclear Pap test results.  Other health care providers may not recommend any screening for nonpregnant women who are considered low risk for pelvic cancer and who do not have symptoms. Ask your health care provider if a screening pelvic exam is right for you.  If you have had past treatment for cervical cancer or a condition that could lead to cancer, you need Pap tests and screening for cancer for at least 20 years after your treatment. If Pap tests have been discontinued, your risk factors (such as having a new sexual partner) need to be reassessed to determine if screening should resume. Some women have medical problems that increase the chance of getting cervical cancer. In these cases, your health care provider may recommend more frequent screening and Pap tests.  Colorectal Cancer  This type of cancer can be detected and often prevented.  Routine colorectal cancer screening usually begins at 33 years of age and continues through 33 years of age.  Your health care provider may recommend screening at an earlier age if  you have risk factors for colon cancer.  Your health care provider may also recommend using home test kits to check for hidden blood in the stool.  A small camera at the end of a tube can be used to examine your colon directly (sigmoidoscopy or colonoscopy). This is done to check for the earliest forms of colorectal cancer.  Routine screening usually begins at age 59.  Direct examination of the colon should be repeated every 5-10 years through 33 years of age. However, you may need to be screened more often if early forms of precancerous polyps or small growths are found.  Skin Cancer  Check your skin from head to toe regularly.  Tell your health care provider about any new moles or changes in moles, especially if there is a change in a mole's shape or color.  Also tell your health care provider if you have a mole that is larger than the size of a pencil eraser.  Always use sunscreen. Apply  sunscreen liberally and repeatedly throughout the day.  Protect yourself by wearing long sleeves, pants, a wide-brimmed hat, and sunglasses whenever you are outside.  Heart disease, diabetes, and high blood pressure  High blood pressure causes heart disease and increases the risk of stroke. High blood pressure is more likely to develop in: ? People who have blood pressure in the high end of the normal range (130-139/85-89 mm Hg). ? People who are overweight or obese. ? People who are African American.  If you are 43-63 years of age, have your blood pressure checked every 3-5 years. If you are 19 years of age or older, have your blood pressure checked every year. You should have your blood pressure measured twice-once when you are at a hospital or clinic, and once when you are not at a hospital or clinic. Record the average of the two measurements. To check your blood pressure when you are not at a hospital or clinic, you can use: ? An automated blood pressure machine at a pharmacy. ? A home blood  pressure monitor.  If you are between 76 years and 62 years old, ask your health care provider if you should take aspirin to prevent strokes.  Have regular diabetes screenings. This involves taking a blood sample to check your fasting blood sugar level. ? If you are at a normal weight and have a low risk for diabetes, have this test once every three years after 33 years of age. ? If you are overweight and have a high risk for diabetes, consider being tested at a younger age or more often. Preventing infection Hepatitis B  If you have a higher risk for hepatitis B, you should be screened for this virus. You are considered at high risk for hepatitis B if: ? You were born in a country where hepatitis B is common. Ask your health care provider which countries are considered high risk. ? Your parents were born in a high-risk country, and you have not been immunized against hepatitis B (hepatitis B vaccine). ? You have HIV or AIDS. ? You use needles to inject street drugs. ? You live with someone who has hepatitis B. ? You have had sex with someone who has hepatitis B. ? You get hemodialysis treatment. ? You take certain medicines for conditions, including cancer, organ transplantation, and autoimmune conditions.  Hepatitis C  Blood testing is recommended for: ? Everyone born from [birth year] through 1966. ? Anyone with known risk factors for hepatitis C.  Sexually transmitted infections (STIs)  You should be screened for sexually transmitted infections (STIs) including gonorrhea and chlamydia if: ? You are sexually active and are younger than 33 years of age. ? You are older than 33 years of age and your health care provider tells you that you are at risk for this type of infection. ? Your sexual activity has changed since you were last screened and you are at an increased risk for chlamydia or gonorrhea. Ask your health care provider if you are at risk.  If you do not have HIV, but are at risk,  it may be recommended that you take a prescription medicine daily to prevent HIV infection. This is called pre-exposure prophylaxis (PrEP). You are considered at risk if: ? You are sexually active and do not regularly use condoms or know the HIV status of your partner(s). ? You take drugs by injection. ? You are sexually active with a partner who has HIV.  Talk with your health care provider  about whether you are at high risk of being infected with HIV. If you choose to begin PrEP, you should first be tested for HIV. You should then be tested every 3 months for as long as you are taking PrEP. Pregnancy  If you are premenopausal and you may become pregnant, ask your health care provider about preconception counseling.  If you may become pregnant, take 400 to 800 micrograms (mcg) of folic acid every day.  If you want to prevent pregnancy, talk to your health care provider about birth control (contraception). Osteoporosis and menopause  Osteoporosis is a disease in which the bones lose minerals and strength with aging. This can result in serious bone fractures. Your risk for osteoporosis can be identified using a bone density scan.  If you are 38 years of age or older, or if you are at risk for osteoporosis and fractures, ask your health care provider if you should be screened.  Ask your health care provider whether you should take a calcium or vitamin D supplement to lower your risk for osteoporosis.  Menopause may have certain physical symptoms and risks.  Hormone replacement therapy may reduce some of these symptoms and risks. Talk to your health care provider about whether hormone replacement therapy is right for you. Follow these instructions at home:  Schedule regular health, dental, and eye exams.  Stay current with your immunizations.  Do not use any tobacco products including cigarettes, chewing tobacco, or electronic cigarettes.  If you are pregnant, do not drink  alcohol.  If you are breastfeeding, limit how much and how often you drink alcohol.  Limit alcohol intake to no more than 1 drink per day for nonpregnant women. One drink equals 12 ounces of beer, 5 ounces of wine, or 1 ounces of hard liquor.  Do not use street drugs.  Do not share needles.  Ask your health care provider for help if you need support or information about quitting drugs.  Tell your health care provider if you often feel depressed.  Tell your health care provider if you have ever been abused or do not feel safe at home. This information is not intended to replace advice given to you by your health care provider. Make sure you discuss any questions you have with your health care provider. Document Released: 06/15/2011 Document Revised: 05/07/2016 Document Reviewed: 09/03/2015 Elsevier Interactive Patient Education  [employer].

## 2018-06-29 NOTE — Assessment & Plan Note (Addendum)
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  Discussed that at this point, I recommend therapy and/or possible psychiatry evaluation. Patient would like to hold off for now. I discussed with patient that if they develop any SI, to tell someone immediately and seek medical attention.

## 2018-07-10 ENCOUNTER — Other Ambulatory Visit: Payer: Self-pay | Admitting: Physician Assistant

## 2018-07-18 ENCOUNTER — Other Ambulatory Visit: Payer: Self-pay | Admitting: *Deleted

## 2018-07-18 ENCOUNTER — Encounter: Payer: Self-pay | Admitting: Physician Assistant

## 2018-07-18 LAB — C-REACTIVE PROTEIN: CRP: 1.1

## 2018-07-18 MED ORDER — BUPROPION HCL ER (SR) 150 MG PO TB12: 150.0000 mg | ORAL_TABLET | Freq: Two times a day (BID) | ORAL | 1 refills | Status: DC

## 2018-08-22 ENCOUNTER — Ambulatory Visit: Payer: BC Managed Care – PPO | Admitting: Physician Assistant

## 2018-08-22 ENCOUNTER — Encounter: Payer: Self-pay | Admitting: Physician Assistant

## 2018-08-22 VITALS — BP 120/78 | HR 90 | Temp 98.1°F | Wt 212.0 lb

## 2018-08-22 DIAGNOSIS — R52 Pain, unspecified: Secondary | ICD-10-CM

## 2018-08-22 DIAGNOSIS — J029 Acute pharyngitis, unspecified: Secondary | ICD-10-CM

## 2018-08-22 LAB — POCT RAPID STREP A (OFFICE): Rapid Strep A Screen: NEGATIVE

## 2018-08-22 MED ORDER — DOXYCYCLINE HYCLATE 100 MG PO TABS: 100.0000 mg | ORAL_TABLET | Freq: Two times a day (BID) | ORAL | 0 refills | Status: DC

## 2018-08-22 MED ORDER — METHYLPREDNISOLONE ACETATE 80 MG/ML IJ SUSP
80.0000 mg | Freq: Once | INTRAMUSCULAR | Status: AC
  Administered 2018-08-22: 80 mg via INTRAMUSCULAR

## 2018-08-22 NOTE — Patient Instructions (Signed)
It was great to see you!  Use medication as prescribed: Doxycycline  Push fluids and get plenty of rest. Please return if you are not improving as expected, or if you have high fevers (>101.5) or difficulty swallowing or worsening productive cough.  Call clinic with questions.  I hope you start feeling better soon!  

## 2018-08-22 NOTE — Progress Notes (Signed)
[name] is a 32 y.o. female here for a new problem.  History of Present Illness:   Chief Complaint  Patient presents with  . Cough    Sinus Problem  This is a new problem. The current episode started 1 to 4 weeks ago. The problem has been gradually worsening since onset. There has been no fever. Her pain is at a severity of 3/10. The pain is mild. Associated symptoms include chills, coughing, ear pain, headaches, sinus pressure, sneezing, a sore throat and swollen glands. Past treatments include acetaminophen and lying down. The treatment provided mild relief.   Her daughter has had a recent stomach bug. Patient has been very tired and not getting adequate rest having to care for her family members.   Past Medical History:  Diagnosis Date  . Acid reflux   . Allergy   . Anxiety   . Depression   . Hyperlipidemia   . Inflammatory arthritis 02/18/2018     Social History   Socioeconomic History  . Marital status: Married    Spouse name: Not on file  . Number of children: Not on file  . Years of education: Not on file  . Highest education level: Not on file  Occupational History  . Not on file  Social Needs  . Financial resource strain: Not on file  . Food insecurity:    Worry: Not on file    Inability: Not on file  . Transportation needs:    Medical: Not on file    Non-medical: Not on file  Tobacco Use  . Smoking status: Never Smoker  . Smokeless tobacco: Never Used  Substance and Sexual Activity  . Alcohol use: No  . Drug use: No  . Sexual activity: Yes    Birth control/protection: None  Lifestyle  . Physical activity:    Days per week: Not on file    Minutes per session: Not on file  . Stress: Not on file  Relationships  . Social connections:    Talks on phone: Not on file    Gets together: Not on file    Attends religious service: Not on file    Active member of club or organization: Not on file    Attends meetings of clubs or organizations: Not on  file    Relationship status: Not on file  . Intimate partner violence:    Fear of current or ex partner: Not on file    Emotionally abused: Not on file    Physically abused: Not on file    Forced sexual activity: Not on file  Other Topics Concern  . Not on file  Social History Narrative   Married with 2 kids -- daughters   Works at [employer] and [employer] (2nd shift)   Live in [place]    Past Surgical History:  Procedure Laterality Date  . PERIPHERALLY INSERTED CENTRAL CATHETER INSERTION    . WISDOM TOOTH EXTRACTION    . WISDOM TOOTH EXTRACTION Bilateral     Family History  Problem Relation Age of Onset  . Hypertension Mother   . Hyperlipidemia Mother   . Depression Sister   . Heart disease Maternal Grandmother   . Heart disease Maternal Grandfather   . Heart disease Paternal Grandmother   . Heart disease Paternal Grandfather   . Diabetes Paternal Aunt   . Diabetes Paternal Uncle   . Breast cancer Neg Hx   . Colon cancer Neg Hx     Allergies  Allergen Reactions  .  Cephalosporins Nausea And Vomiting and Rash  . Sulfamethoxazole-Trimethoprim   . Zofran Nausea And Vomiting and Other (See Comments)    Chest pain and nose bleeds    Current Medications:   Current Outpatient Medications:  .  Azelastine-Fluticasone (DYMISTA) 137-50 MCG/ACT SUSP, Dymista 137 mcg-50 mcg/spray nasal spray 1 spray each nare twice a day, Disp: , Rfl:  .  buPROPion (WELLBUTRIN SR) 150 MG 12 hr tablet, Take 1 tablet (150 mg total) by mouth 2 (two) times daily., Disp: 180 tablet, Rfl: 1 .  citalopram (CELEXA) 40 MG tablet, Take 1 tablet (40 mg total) by mouth daily., Disp: 90 tablet, Rfl: 1 .  cyclobenzaprine (FLEXERIL) 5 MG tablet, Take 1 tablet (5 mg total) by mouth 3 (three) times daily as needed for muscle spasms., Disp: 20 tablet, Rfl: 0 .  hydroxychloroquine (PLAQUENIL) 200 MG tablet, 2 TABLETS WITH FOOD OR MILK ONCE A DAY, Disp: , Rfl: 2 .  ibuprofen (ADVIL,MOTRIN) 800 MG tablet, , Disp: , Rfl:   .  levocetirizine (XYZAL) 5 MG tablet, levocetirizine 5 mg tablet, Disp: , Rfl:  .  levonorgestrel (MIRENA) 20 MCG/24HR IUD, 1 each by Intrauterine route once. Inserted 01/19/2018, needs to be removed 01/2023., Disp: , Rfl:  .  mometasone (ELOCON) 0.1 % cream, mometasone 0.1 % topical cream, Disp: , Rfl:  .  montelukast (SINGULAIR) 10 MG tablet, Take 10 mg by mouth daily., Disp: , Rfl: 1 .  ranitidine (ZANTAC) 150 MG tablet, Take 150 mg by mouth 2 (two) times daily., Disp: , Rfl:  .  doxycycline (VIBRA-TABS) 100 MG tablet, Take 1 tablet (100 mg total) by mouth 2 (two) times daily., Disp: 20 tablet, Rfl: 0   Review of Systems:   Review of Systems  Constitutional: Positive for chills.  HENT: Positive for ear pain, sinus pressure, sneezing and sore throat.   Respiratory: Positive for cough.   Neurological: Positive for headaches.    Vitals:   Vitals:   08/22/18 1535  BP: 120/78  Pulse: 90  Temp: 98.1 F (36.7 C)  TempSrc: Oral  SpO2: 98%  Weight: 212 lb (96.2 kg)     Body mass index is 36.39 kg/m.  Physical Exam:   Physical Exam  Constitutional: She appears well-developed. She is cooperative.  Non-toxic appearance. She does not have a sickly appearance. She does not appear ill. No distress.  HENT:  Head: Normocephalic and atraumatic.  Right Ear: Tympanic membrane, external ear and ear canal normal. Tympanic membrane is not erythematous, not retracted and not bulging.  Left Ear: Tympanic membrane, external ear and ear canal normal. Tympanic membrane is not erythematous, not retracted and not bulging.  Nose: Right sinus exhibits maxillary sinus tenderness. Right sinus exhibits no frontal sinus tenderness. Left sinus exhibits maxillary sinus tenderness. Left sinus exhibits no frontal sinus tenderness.  Mouth/Throat: Uvula is midline and mucous membranes are normal. Posterior oropharyngeal erythema present. No posterior oropharyngeal edema. Tonsils are 1+ on the right. Tonsils are 1+  on the left.  Eyes: Conjunctivae and lids are normal.  Neck: Trachea normal.  Cardiovascular: Normal rate, regular rhythm, S1 normal, S2 normal and normal heart sounds.  Pulmonary/Chest: Effort normal and breath sounds normal. She has no decreased breath sounds. She has no wheezes. She has no rhonchi. She has no rales.  Lymphadenopathy:    She has no cervical adenopathy.  Neurological: She is alert.  Skin: Skin is warm, dry and intact.  Psychiatric: She has a normal mood and affect. Her speech is normal  and behavior is normal.  Nursing note and vitals reviewed.   Results for orders placed or performed in visit on 08/22/18  POCT rapid strep A  Result Value Ref Range   Rapid Strep A Screen Negative Negative    Assessment and Plan:    Delbra was seen today for cough.  Diagnoses and all orders for this visit:  Sore throat -     POCT rapid strep A  Generalized body aches -     methylPREDNISolone acetate (DEPO-MEDROL) injection 80 mg  Other orders -     doxycycline (VIBRA-TABS) 100 MG tablet; Take 1 tablet (100 mg total) by mouth 2 (two) times daily.   No red flags on exam.  Strep test negative. Received depo-medrol injection in the office and tolerated well. Will initiate doxycycline per orders. Discussed taking medications as prescribed. Reviewed return precautions including worsening fever, SOB, worsening cough or other concerns. Push fluids and rest. I recommend that patient follow-up if symptoms worsen or persist despite treatment x 7-10 days, sooner if needed.   . Reviewed expectations re: course of current medical issues. . Discussed self-management of symptoms. . Outlined signs and symptoms indicating need for more acute intervention. . Patient verbalized understanding and all questions were answered. . See orders for this visit as documented in the electronic medical record. . Patient received an After-Visit Summary.   Jarold Motto, PA-C

## 2018-11-02 ENCOUNTER — Ambulatory Visit: Payer: BC Managed Care – PPO | Admitting: Physician Assistant

## 2018-11-02 ENCOUNTER — Encounter: Payer: Self-pay | Admitting: Physician Assistant

## 2018-11-02 ENCOUNTER — Telehealth: Payer: Self-pay | Admitting: *Deleted

## 2018-11-02 VITALS — BP 100/60 | HR 83 | Temp 98.8°F | Ht 64.0 in | Wt 223.5 lb

## 2018-11-02 DIAGNOSIS — Z23 Encounter for immunization: Secondary | ICD-10-CM | POA: Diagnosis not present

## 2018-11-02 DIAGNOSIS — R635 Abnormal weight gain: Secondary | ICD-10-CM

## 2018-11-02 DIAGNOSIS — F330 Major depressive disorder, recurrent, mild: Secondary | ICD-10-CM | POA: Diagnosis not present

## 2018-11-02 DIAGNOSIS — F419 Anxiety disorder, unspecified: Secondary | ICD-10-CM

## 2018-11-02 MED ORDER — PHENTERMINE HCL 37.5 MG PO TABS
ORAL_TABLET | ORAL | 0 refills | Status: DC
Start: 2018-11-02 — End: 2018-12-16

## 2018-11-02 NOTE — Progress Notes (Signed)
[name] is a 32 y.o. female is here to discuss: Anxiety and Depression  I acted as a Neurosurgeon for [employer], PA-C [name], LPN  History of Present Illness:   Chief Complaint  Patient presents with  . Anxiety  . Depression    Anxiety  Presents for follow-up (Pt states since here last anxiety has increased.) visit. Symptoms include depressed mood, excessive worry, insomnia, irritability, nervous/anxious behavior and restlessness. Patient reports no chest pain, confusion, decreased concentration, dizziness, dry mouth, malaise, muscle tension, nausea, palpitations, panic, shortness of breath or suicidal ideas. Symptoms occur constantly. The severity of symptoms is moderate, causing significant distress and interfering with daily activities. The quality of sleep is fair. Nighttime awakenings: one to two.   Compliance with medications is 76-100%. Treatment side effects: Pt tolerating medication well.  Depression         This is a chronic (Pt states since here last depression has increased due to daughter and work and pt has not been going to the gym so she is eating more.) problem.  The problem occurs constantly.  The problem has been gradually worsening since onset.  Associated symptoms include insomnia, irritable, restlessness, appetite change and sad.  Associated symptoms include no decreased concentration, no helplessness, no hopelessness, no decreased interest, no body aches, no myalgias, no headaches, no indigestion and no suicidal ideas.     The symptoms are aggravated by work stress and family issues.  Compliance with treatment is good.  Previous treatment provided mild relief.  Risk factors include a recent illness (Daughter had a head injury.).   Past medical history includes anxiety.    Depression screen [facility] 2/9 11/02/2018 06/29/2018 05/02/2018 03/21/2018 09/20/2017  Decreased Interest 2 1 1 1  0  Down, Depressed, Hopeless 2 2 1 2 1   PHQ - 2 Score 4 3 2 3 1   Altered  sleeping 2 2 2 2 2   Tired, decreased energy 1 2 1 2 2   Change in appetite 3 0 0 2 0  Feeling bad or failure about yourself  1 2 1 2 1   Trouble concentrating 0 2 1 3 2   Moving slowly or fidgety/restless 0 2 0 1 0  Suicidal thoughts 0 0 0 0 0  PHQ-9 Score 11 13 7 15 8   Difficult doing work/chores Somewhat difficult Somewhat difficult Somewhat difficult Somewhat difficult Somewhat difficult   GAD 7 : Generalized Anxiety Score 11/02/2018 06/29/2018 05/02/2018 03/21/2018  Nervous, Anxious, on Edge 2 2 1 1   Control/stop worrying 2 2 2 2   Worry too much - different things 2 2 1 3   Trouble relaxing 2 2 2 2   Restless 0 2 1 1   Easily annoyed or irritable 1 2 2 3   Afraid - awful might happen 0 2 0 0  Total GAD 7 Score 9 14 9 12   Anxiety Difficulty Very difficult Somewhat difficult Somewhat difficult Somewhat difficult    Wt Readings from Last 5 Encounters:  11/02/18 223 lb 8 oz (101.4 kg)  08/22/18 212 lb (96.2 kg)  06/29/18 207 lb 4 oz (94 kg)  05/02/18 203 lb 6.1 oz (92.3 kg)  04/13/18 209 lb 4 oz (94.9 kg)   She is struggling with comfort eating. She is bingeing on ice cream. She is lacking motivation to get back into the gym.   Health Maintenance Due  Topic Date Due  . INFLUENZA VACCINE  07/14/2018    Past Medical History:  Diagnosis Date  . Acid reflux   .  Allergy   . Anxiety   . Depression   . Hyperlipidemia   . Inflammatory arthritis 02/18/2018     Social History   Socioeconomic History  . Marital status: Married    Spouse name: Not on file  . Number of children: Not on file  . Years of education: Not on file  . Highest education level: Not on file  Occupational History  . Not on file  Social Needs  . Financial resource strain: Not on file  . Food insecurity:    Worry: Not on file    Inability: Not on file  . Transportation needs:    Medical: Not on file    Non-medical: Not on file  Tobacco Use  . Smoking status: Never Smoker  . Smokeless tobacco: Never Used    Substance and Sexual Activity  . Alcohol use: No  . Drug use: No  . Sexual activity: Yes    Birth control/protection: None  Lifestyle  . Physical activity:    Days per week: Not on file    Minutes per session: Not on file  . Stress: Not on file  Relationships  . Social connections:    Talks on phone: Not on file    Gets together: Not on file    Attends religious service: Not on file    Active member of club or organization: Not on file    Attends meetings of clubs or organizations: Not on file    Relationship status: Not on file  . Intimate partner violence:    Fear of current or ex partner: Not on file    Emotionally abused: Not on file    Physically abused: Not on file    Forced sexual activity: Not on file  Other Topics Concern  . Not on file  Social History Narrative   Married with 2 kids -- daughters   Works at [employer] and [employer] (2nd shift)   Live in [place]    Past Surgical History:  Procedure Laterality Date  . PERIPHERALLY INSERTED CENTRAL CATHETER INSERTION    . WISDOM TOOTH EXTRACTION    . WISDOM TOOTH EXTRACTION Bilateral     Family History  Problem Relation Age of Onset  . Hypertension Mother   . Hyperlipidemia Mother   . Depression Sister   . Heart disease Maternal Grandmother   . Heart disease Maternal Grandfather   . Heart disease Paternal Grandmother   . Heart disease Paternal Grandfather   . Diabetes Paternal Aunt   . Diabetes Paternal Uncle   . Breast cancer Neg Hx   . Colon cancer Neg Hx     PMHx, SurgHx, SocialHx, FamHx, Medications, and Allergies were reviewed in the Visit Navigator and updated as appropriate.   Patient Active Problem List   Diagnosis Date Noted  . Obesity 06/29/2018  . Inflammatory arthritis 02/18/2018  . Rash 09/14/2017  . Depression 06/28/2017  . Acid reflux 06/28/2017  . Allergy 06/28/2017  . Anxiety 06/28/2017    Social History   Tobacco Use  . Smoking status: Never Smoker  . Smokeless tobacco: Never  Used  Substance Use Topics  . Alcohol use: No  . Drug use: No    Current Medications and Allergies:    Current Outpatient Medications:  .  Azelastine-Fluticasone (DYMISTA) 137-50 MCG/ACT SUSP, Dymista 137 mcg-50 mcg/spray nasal spray 1 spray each nare twice a day, Disp: , Rfl:  .  buPROPion (WELLBUTRIN SR) 150 MG 12 hr tablet, Take 1 tablet (150 mg total)  by mouth 2 (two) times daily., Disp: 180 tablet, Rfl: 1 .  citalopram (CELEXA) 40 MG tablet, Take 1 tablet (40 mg total) by mouth daily., Disp: 90 tablet, Rfl: 1 .  cyclobenzaprine (FLEXERIL) 5 MG tablet, Take 1 tablet (5 mg total) by mouth 3 (three) times daily as needed for muscle spasms., Disp: 20 tablet, Rfl: 0 .  hydroxychloroquine (PLAQUENIL) 200 MG tablet, 2 TABLETS WITH FOOD OR MILK ONCE A DAY, Disp: , Rfl: 2 .  ibuprofen (ADVIL,MOTRIN) 800 MG tablet, , Disp: , Rfl:  .  levocetirizine (XYZAL) 5 MG tablet, levocetirizine 5 mg tablet, Disp: , Rfl:  .  levonorgestrel (MIRENA) 20 MCG/24HR IUD, 1 each by Intrauterine route once. Inserted 01/19/2018, needs to be removed 01/2023., Disp: , Rfl:  .  mometasone (ELOCON) 0.1 % cream, mometasone 0.1 % topical cream, Disp: , Rfl:  .  montelukast (SINGULAIR) 10 MG tablet, Take 10 mg by mouth daily., Disp: , Rfl: 1 .  ranitidine (ZANTAC) 150 MG tablet, Take 150 mg by mouth 2 (two) times daily., Disp: , Rfl:  .  traMADol (ULTRAM) 50 MG tablet, Take 50 mg by mouth as needed. , Disp: , Rfl:  .  phentermine (ADIPEX-P) 37.5 MG tablet, Take 1/2 a tablet daily x 2 weeks. May increase to 1 tablet daily after two weeks., Disp: 30 tablet, Rfl: 0   Allergies  Allergen Reactions  . Cephalosporins Nausea And Vomiting and Rash  . Sulfamethoxazole-Trimethoprim   . Zofran Nausea And Vomiting and Other (See Comments)    Chest pain and nose bleeds    Review of Systems   Review of Systems  Constitutional: Positive for appetite change and irritability.  Respiratory: Negative for shortness of breath.     Cardiovascular: Negative for chest pain and palpitations.  Gastrointestinal: Negative for nausea.  Musculoskeletal: Negative for myalgias.  Neurological: Negative for dizziness and headaches.  Psychiatric/Behavioral: Positive for depression. Negative for confusion, decreased concentration and suicidal ideas. The patient is nervous/anxious and has insomnia.     Vitals:   Vitals:   11/02/18 0747  BP: 100/60  Pulse: 83  Temp: 98.8 F (37.1 C)  TempSrc: Oral  SpO2: 97%  Weight: 223 lb 8 oz (101.4 kg)  Height: 5\' 4"  (1.626 m)     Body mass index is 38.36 kg/m.   Physical Exam:    Physical Exam  Constitutional: She appears well-developed. She is irritable and cooperative.  Non-toxic appearance. She does not have a sickly appearance. She does not appear ill. No distress.  Cardiovascular: Normal rate, regular rhythm, S1 normal, S2 normal, normal heart sounds and normal pulses.  No LE edema  Pulmonary/Chest: Effort normal and breath sounds normal.  Neurological: She is alert. GCS eye subscore is 4. GCS verbal subscore is 5. GCS motor subscore is 6.  Skin: Skin is warm, dry and intact.  Psychiatric: She has a normal mood and affect. Her speech is normal and behavior is normal.  Nursing note and vitals reviewed.    Assessment and Plan:    Joel was seen today for anxiety and depression.  Diagnoses and all orders for this visit:  Mild episode of recurrent major depressive disorder (HCC) and Anxiety Overall stable but she is concerned about her weight gain and coping mechanisms to help with her stress reduction (not going to the gym and now eating excessively). Denies SI/HI. Continue current wellbutrin and celexa regimen. I did discuss that if she feels uncontrolled with this, we will pursue psychiatry.  Weight  gain Start phentermine, half dose for two weeks. May increase to full tablet if tolerated. Follow-up in 1 month. If adverse side effects, stop immediately.  Other  orders -     phentermine (ADIPEX-P) 37.5 MG tablet; Take 1/2 a tablet daily x 2 weeks. May increase to 1 tablet daily after two weeks.    . Reviewed expectations re: course of current medical issues. . Discussed self-management of symptoms. . Outlined signs and symptoms indicating need for more acute intervention. . Patient verbalized understanding and all questions were answered. . See orders for this visit as documented in the electronic medical record. . Patient received an After Visit Summary.  CMA or LPN served as scribe during this visit. History, Physical, and Plan performed by medical provider. The above documentation has been reviewed and is accurate and complete.  Jarold MottoSamantha Vane Yapp, PA-C [place] [place] 11/02/2018  Follow-up: Return in about 1 month (around 12/02/2018) for Medication F/U.

## 2018-11-02 NOTE — Telephone Encounter (Signed)
Prior Auth started for Phentermine 37.5 mg tablet thru [employer]. Key: [id number]. Waiting for response.

## 2018-11-02 NOTE — Patient Instructions (Signed)
It was great to see you!  Start 1/2 a tablet of phentermine in the morning. Take 1/2 tablet daily x 2 weeks. May increase to 1 full tablet daily after two weeks if desired.  If you cannot tolerate the side effects of the medication, please stop it.  Let's follow-up in 1 month, sooner if you have concerns.  Take care,  Jarold MottoSamantha Ladawn Boullion PA-C

## 2018-11-03 ENCOUNTER — Telehealth: Payer: Self-pay | Admitting: Physician Assistant

## 2018-11-03 NOTE — Telephone Encounter (Signed)
I have emailed charge corrections and am awaiting an email response with a possible solution prior to contact patient

## 2018-11-03 NOTE — Telephone Encounter (Signed)
-----   Message from Juliette AlcideHailey N Small sent at 11/02/2018  7:36 AM EST ----- Regarding: Billed for CPE Pt was here on 06/29/18 for CPE and stated she received a bill for $10 copay.

## 2018-11-04 NOTE — Telephone Encounter (Signed)
Received fax from [facility] Prior Auth for Phentermine was approved 11/02/2018- 02/02/2019.

## 2018-11-04 NOTE — Telephone Encounter (Signed)
Spoke to pt told her I got Phentermine approved and should be able to pickup, approved for 4 months. Pt verbalized understanding and said she picked it up yesterday. Told her okay.

## 2018-11-14 ENCOUNTER — Encounter: Payer: Self-pay | Admitting: Physician Assistant

## 2018-11-28 ENCOUNTER — Ambulatory Visit: Payer: BC Managed Care – PPO | Admitting: Physician Assistant

## 2018-11-28 ENCOUNTER — Encounter: Payer: Self-pay | Admitting: Physician Assistant

## 2018-11-28 VITALS — BP 110/70 | HR 105 | Temp 98.9°F | Ht 64.0 in | Wt 223.4 lb

## 2018-11-28 DIAGNOSIS — R6889 Other general symptoms and signs: Secondary | ICD-10-CM | POA: Diagnosis not present

## 2018-11-28 LAB — POC INFLUENZA A&B (BINAX/QUICKVUE)
INFLUENZA A, POC: NEGATIVE
Influenza B, POC: NEGATIVE

## 2018-11-28 MED ORDER — OSELTAMIVIR PHOSPHATE 75 MG PO CAPS: 75.0000 mg | ORAL_CAPSULE | Freq: Two times a day (BID) | ORAL | 0 refills | Status: DC

## 2018-11-28 MED ORDER — PROMETHAZINE HCL 12.5 MG PO TABS: 12.5000 mg | ORAL_TABLET | Freq: Four times a day (QID) | ORAL | 0 refills | Status: DC | PRN

## 2018-11-28 NOTE — Progress Notes (Signed)
[name] is a 32 y.o. female here for a new problem.  I acted as a Neurosurgeonscribe for [employer]antha Joni Norrod, PA-C Corky Mullonna Orphanos, LPN  History of Present Illness:   Chief Complaint  Patient presents with  . Cough    GI Problem  The primary symptoms include fatigue, abdominal pain, nausea, vomiting and diarrhea. The illness began 2 days ago. The onset was sudden. The problem has been gradually worsening.  Nausea began 2 days ago. The nausea is exacerbated by motion.  The vomiting began 2 days ago. Frequency: 2 times over the weekend. The emesis contains stomach contents and bilious material.  The illness is also significant for chills. Associated medical issues comments: Flu like symptoms. Risk factors: Exposed at work to [employer].   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  Patient has young children at home.  Past Medical History:  Diagnosis Date  . Acid reflux   . Allergy   . Anxiety   . Depression   . Hyperlipidemia   . Inflammatory arthritis 02/18/2018     Social History   Socioeconomic History  . Marital status: Married    Spouse name: Not on file  . Number of children: Not on file  . Years of education: Not on file  . Highest education level: Not on file  Occupational History  . Not on file  Social Needs  . Financial resource strain: Not on file  . Food insecurity:    Worry: Not on file    Inability: Not on file  . Transportation needs:    Medical: Not on file    Non-medical: Not on file  Tobacco Use  . Smoking status: Never Smoker  . Smokeless tobacco: Never Used  Substance and Sexual Activity  . Alcohol use: No  . Drug use: No  . Sexual activity: Yes    Birth control/protection: None  Lifestyle  . Physical activity:    Days per week: Not on file    Minutes per session: Not on file  . Stress: Not on file  Relationships  . Social connections:    Talks on phone: Not on file    Gets together: Not on file    Attends  religious service: Not on file    Active member of club or organization: Not on file    Attends meetings of clubs or organizations: Not on file    Relationship status: Not on file  . Intimate partner violence:    Fear of current or ex partner: Not on file    Emotionally abused: Not on file    Physically abused: Not on file    Forced sexual activity: Not on file  Other Topics Concern  . Not on file  Social History Narrative   Married with 2 kids -- daughters   Works at [employer] and [employer] (2nd shift)   Live in [place]    Past Surgical History:  Procedure Laterality Date  . PERIPHERALLY INSERTED CENTRAL CATHETER INSERTION    . WISDOM TOOTH EXTRACTION    . WISDOM TOOTH EXTRACTION Bilateral     Family History  Problem Relation Age of Onset  . Hypertension Mother   . Hyperlipidemia Mother   . Depression Sister   . Heart disease Maternal Grandmother   . Heart disease Maternal Grandfather   . Heart disease Paternal Grandmother   . Heart disease Paternal Grandfather   . Diabetes Paternal Aunt   . Diabetes Paternal Uncle   .  Breast cancer Neg Hx   . Colon cancer Neg Hx     Allergies  Allergen Reactions  . Cephalosporins Nausea And Vomiting and Rash  . Sulfamethoxazole-Trimethoprim   . Zofran Nausea And Vomiting and Other (See Comments)    Chest pain and nose bleeds    Current Medications:   Current Outpatient Medications:  .  Azelastine-Fluticasone (DYMISTA) 137-50 MCG/ACT SUSP, Dymista 137 mcg-50 mcg/spray nasal spray 1 spray each nare twice a day, Disp: , Rfl:  .  buPROPion (WELLBUTRIN SR) 150 MG 12 hr tablet, Take 1 tablet (150 mg total) by mouth 2 (two) times daily., Disp: 180 tablet, Rfl: 1 .  citalopram (CELEXA) 40 MG tablet, Take 1 tablet (40 mg total) by mouth daily., Disp: 90 tablet, Rfl: 1 .  cyclobenzaprine (FLEXERIL) 5 MG tablet, Take 1 tablet (5 mg total) by mouth 3 (three) times daily as needed for muscle spasms., Disp: 20 tablet, Rfl: 0 .  hydroxychloroquine  (PLAQUENIL) 200 MG tablet, 2 TABLETS WITH FOOD OR MILK ONCE A DAY, Disp: , Rfl: 2 .  ibuprofen (ADVIL,MOTRIN) 800 MG tablet, , Disp: , Rfl:  .  levocetirizine (XYZAL) 5 MG tablet, levocetirizine 5 mg tablet, Disp: , Rfl:  .  levonorgestrel (MIRENA) 20 MCG/24HR IUD, 1 each by Intrauterine route once. Inserted 01/19/2018, needs to be removed 01/2023., Disp: , Rfl:  .  mometasone (ELOCON) 0.1 % cream, mometasone 0.1 % topical cream, Disp: , Rfl:  .  montelukast (SINGULAIR) 10 MG tablet, Take 10 mg by mouth daily., Disp: , Rfl: 1 .  phentermine (ADIPEX-P) 37.5 MG tablet, Take 1/2 a tablet daily x 2 weeks. May increase to 1 tablet daily after two weeks., Disp: 30 tablet, Rfl: 0 .  ranitidine (ZANTAC) 150 MG tablet, Take 150 mg by mouth 2 (two) times daily., Disp: , Rfl:  .  traMADol (ULTRAM) 50 MG tablet, Take 50 mg by mouth as needed. , Disp: , Rfl:  .  oseltamivir (TAMIFLU) 75 MG capsule, Take 1 capsule (75 mg total) by mouth 2 (two) times daily., Disp: 10 capsule, Rfl: 0 .  promethazine (PHENERGAN) 12.5 MG tablet, Take 1 tablet (12.5 mg total) by mouth every 6 (six) hours as needed for nausea or vomiting., Disp: 30 tablet, Rfl: 0   Review of Systems:   Review of Systems  Constitutional: Positive for chills and fatigue.  Gastrointestinal: Positive for abdominal pain, diarrhea, nausea and vomiting.    Vitals:   Vitals:   11/28/18 1602  BP: 110/70  Pulse: (!) 105  Temp: 98.9 F (37.2 C)  TempSrc: Oral  SpO2: 97%  Weight: 223 lb 6.1 oz (101.3 kg)  Height: 5\' 4"  (1.626 m)     Body mass index is 38.34 kg/m.  Physical Exam:   Physical Exam Vitals signs and nursing note reviewed.  Constitutional:      General: She is not in acute distress.    Appearance: She is well-developed. She is not ill-appearing or toxic-appearing.  HENT:     Head: Normocephalic and atraumatic.     Right Ear: Tympanic membrane, ear canal and external ear normal. Tympanic membrane is not erythematous, retracted or  bulging.     Left Ear: Tympanic membrane, ear canal and external ear normal. Tympanic membrane is not erythematous, retracted or bulging.     Nose: Mucosal edema, congestion and rhinorrhea present.     Right Sinus: No maxillary sinus tenderness or frontal sinus tenderness.     Left Sinus: No maxillary sinus tenderness or  frontal sinus tenderness.     Mouth/Throat:     Pharynx: Uvula midline. No posterior oropharyngeal erythema.     Tonsils: Swelling: 1+ on the right. 1+ on the left.  Eyes:     General: Lids are normal.     Conjunctiva/sclera: Conjunctivae normal.  Neck:     Trachea: Trachea normal.  Cardiovascular:     Rate and Rhythm: Regular rhythm. Tachycardia present.     Heart sounds: Normal heart sounds, S1 normal and S2 normal.  Pulmonary:     Effort: Pulmonary effort is normal.     Breath sounds: Normal breath sounds. No decreased breath sounds, wheezing, rhonchi or rales.  Lymphadenopathy:     Cervical: No cervical adenopathy.  Skin:    General: Skin is warm and dry.  Neurological:     Mental Status: She is alert.  Psychiatric:        Speech: Speech normal.        Behavior: Behavior normal. Behavior is cooperative.     Results for orders placed or performed in visit on 11/28/18  POC Influenza A&B(BINAX/QUICKVUE)  Result Value Ref Range   Influenza A, POC Negative Negative   Influenza B, POC Negative Negative     Assessment and Plan:   Azka was seen today for cough.  Diagnoses and all orders for this visit:  Flu-like symptoms No red flags on exam. Flu negative, but has had multiple contacts with patients and coworkers diagnosed with flu. She is also concerned because she has small children at home. After shared decision making with patient, will initiate Tamiflu per orders. Discussed side effects of medication. Discussed taking medications as prescribed. Reviewed return precautions including worsening fever, SOB, worsening cough or other concerns. Push fluids  and rest. I recommend that patient follow-up if symptoms worsen or persist despite treatment x 7-10 days, sooner if needed. -     POC Influenza A&B(BINAX/QUICKVUE)  Other orders -     oseltamivir (TAMIFLU) 75 MG capsule; Take 1 capsule (75 mg total) by mouth 2 (two) times daily. -     promethazine (PHENERGAN) 12.5 MG tablet; Take 1 tablet (12.5 mg total) by mouth every 6 (six) hours as needed for nausea or vomiting.  . Reviewed expectations re: course of current medical issues. . Discussed self-management of symptoms. . Outlined signs and symptoms indicating need for more acute intervention. . Patient verbalized understanding and all questions were answered. . See orders for this visit as documented in the electronic medical record. . Patient received an After-Visit Summary.  CMA or LPN served as scribe during this visit. History, Physical, and Plan performed by medical provider. The above documentation has been reviewed and is accurate and complete.   Jarold Motto, PA-C

## 2018-11-28 NOTE — Patient Instructions (Signed)
I strongly suspect you have the flu. Start Tamiflu, push fluids.  Please increase your fluid intake. Hydration is key!  Get plenty of rest.   If you feel up to eating solid foods, try to follow the dietary guide at the bottom of your After Visit Summary. These foods are gentler on the stomach.  If you are unable to keep fluids down by mouth, or if symptoms acutely worsen, please go to the ER as you may need IV fluids.    Food Choices to Help Relieve Diarrhea, Adult When you have diarrhea, the foods you eat and your eating habits are very important. Choosing the right foods and drinks can help relieve diarrhea. Also, because diarrhea can last up to 7 days, you need to replace lost fluids and electrolytes (such as sodium, potassium, and chloride) in order to help prevent dehydration.  WHAT GENERAL GUIDELINES DO I NEED TO FOLLOW?  Slowly drink 1 cup (8 oz) of fluid for each episode of diarrhea. If you are getting enough fluid, your urine will be clear or pale yellow.  Eat starchy foods. Some good choices include white rice, white toast, pasta, low-fiber cereal, baked potatoes (without the skin), saltine crackers, and bagels.  Avoid large servings of any cooked vegetables.  Limit fruit to two servings per day. A serving is  cup or 1 small piece.  Choose foods with less than 2 g of fiber per serving.  Limit fats to less than 8 tsp (38 g) per day.  Avoid fried foods.  Eat foods that have probiotics in them. Probiotics can be found in certain dairy products.  Avoid foods and beverages that may increase the speed at which food moves through the stomach and intestines (gastrointestinal tract). Things to avoid include:  High-fiber foods, such as dried fruit, raw fruits and vegetables, nuts, seeds, and whole grain foods.  Spicy foods and high-fat foods.  Foods and beverages sweetened with high-fructose corn syrup, honey, or sugar alcohols such as xylitol, sorbitol, and mannitol. WHAT  FOODS ARE RECOMMENDED? Grains White rice. White, [place], or pita breads (fresh or toasted), including plain rolls, buns, or bagels. White pasta. Saltine, soda, or graham crackers. Pretzels. Low-fiber cereal. Cooked cereals made with water (such as cornmeal, farina, or cream cereals). Plain muffins. Matzo. Melba toast. Zwieback.  Vegetables Potatoes (without the skin). Strained tomato and vegetable juices. Most well-cooked and canned vegetables without seeds. Tender lettuce. Fruits Cooked or canned applesauce, apricots, cherries, fruit cocktail, grapefruit, peaches, pears, or plums. Fresh bananas, apples without skin, cherries, grapes, cantaloupe, grapefruit, peaches, oranges, or plums.  Meat and Other Protein Products Baked or boiled chicken. Eggs. Tofu. Fish. Seafood. Smooth peanut butter. Ground or well-cooked tender beef, ham, veal, lamb, pork, or poultry.  Dairy Plain yogurt, kefir, and unsweetened liquid yogurt. Lactose-free milk, buttermilk, or soy milk. Plain hard cheese. Beverages Sport drinks. Clear broths. Diluted fruit juices (except prune). Regular, caffeine-free sodas such as ginger ale. Water. Decaffeinated teas. Oral rehydration solutions. Sugar-free beverages not sweetened with sugar alcohols. Other Bouillon, broth, or soups made from recommended foods.  The items listed above may not be a complete list of recommended foods or beverages. Contact your dietitian for more options. WHAT FOODS ARE NOT RECOMMENDED? Grains Whole grain, whole wheat, bran, or rye breads, rolls, pastas, crackers, and cereals. Wild or brown rice. Cereals that contain more than 2 g of fiber per serving. Corn tortillas or taco shells. Cooked or dry oatmeal. Granola. Popcorn. Vegetables Raw vegetables. Cabbage, broccoli, Brussels sprouts,  artichokes, baked beans, beet greens, corn, kale, legumes, peas, sweet potatoes, and yams. Potato skins. Cooked spinach and cabbage. Fruits Dried fruit, including raisins  and dates. Raw fruits. Stewed or dried prunes. Fresh apples with skin, apricots, mangoes, pears, raspberries, and strawberries.  Meat and Other Protein Products Chunky peanut butter. Nuts and seeds. Beans and lentils. Tomasa BlaseBacon.  Dairy High-fat cheeses. Milk, chocolate milk, and beverages made with milk, such as milk shakes. Cream. Ice cream. Sweets and Desserts Sweet rolls, doughnuts, and sweet breads. Pancakes and waffles. Fats and Oils Butter. Cream sauces. Margarine. Salad oils. Plain salad dressings. Olives. Avocados.  Beverages Caffeinated beverages (such as coffee, tea, soda, or energy drinks). Alcoholic beverages. Fruit juices with pulp. Prune juice. Soft drinks sweetened with high-fructose corn syrup or sugar alcohols. Other Coconut. Hot sauce. Chili powder. Mayonnaise. Gravy. Cream-based or milk-based soups.  The items listed above may not be a complete list of foods and beverages to avoid. Contact your dietitian for more information. WHAT SHOULD I DO IF I BECOME DEHYDRATED? Diarrhea can sometimes lead to dehydration. Signs of dehydration include dark urine and dry mouth and skin. If you think you are dehydrated, you should rehydrate with an oral rehydration solution. These solutions can be purchased at pharmacies, retail stores, or online.  Drink -1 cup (120-240 mL) of oral rehydration solution each time you have an episode of diarrhea. If drinking this amount makes your diarrhea worse, try drinking smaller amounts more often. For example, drink 1-3 tsp (5-15 mL) every 5-10 minutes.  A general rule for staying hydrated is to drink 1-2 L of fluid per day. Talk to your health care provider about the specific amount you should be drinking each day. Drink enough fluids to keep your urine clear or pale yellow.   This information is not intended to replace advice given to you by your health care provider. Make sure you discuss any questions you have with your health care provider.   Document  Released: 02/20/2004 Document Revised: 12/21/2014 Document Reviewed: 10/23/2013 Elsevier Interactive Patient Education 2016 [employer]      Influenza, Adult Influenza, more commonly known as "the flu," is a viral infection that primarily affects the respiratory tract. The respiratory tract includes organs that help you breathe, such as the lungs, nose, and throat. The flu causes many common cold symptoms, as well as a high fever and body aches. The flu spreads easily from person to person (is contagious). Getting a flu shot (influenza vaccination) every year is the best way to prevent influenza. What are the causes? Influenza is caused by a virus. You can catch the virus by:  Breathing in droplets from an infected person's cough or sneeze.  Touching something that was recently contaminated with the virus and then touching your mouth, nose, or eyes.  What increases the risk? The following factors may make you more likely to get the flu:  Not cleaning your hands frequently with soap and water or alcohol-based hand sanitizer.  Having close contact with many people during cold and flu season.  Touching your mouth, eyes, or nose without washing or sanitizing your hands first.  Not drinking enough fluids or not eating a healthy diet.  Not getting enough sleep or exercise.  Being under a high amount of stress.  Not getting a yearly (annual) flu shot.  You may be at a higher risk of complications from the flu, such as a severe lung infection (pneumonia), if you:  Are over the age of 33.  Are pregnant.  Have a weakened disease-fighting system (immune system). You may have a weakened immune system if you: ? Have HIV or AIDS. ? Are undergoing chemotherapy. ? Aretaking medicines that reduce the activity of (suppress) the immune system.  Have a long-term (chronic) illness, such as heart disease, kidney disease, diabetes, or lung disease.  Have a liver disorder.  Are  obese.  Have anemia.  What are the signs or symptoms? Symptoms of this condition typically last 4-10 days and may include:  Fever.  Chills.  Headache, body aches, or muscle aches.  Sore throat.  Cough.  Runny or congested nose.  Chest discomfort and cough.  Poor appetite.  Weakness or tiredness (fatigue).  Dizziness.  Nausea or vomiting.  How is this diagnosed? This condition may be diagnosed based on your medical history and a physical exam. Your health care provider may do a nose or throat swab test to confirm the diagnosis. How is this treated? If influenza is detected early, you can be treated with antiviral medicine that can reduce the length of your illness and the severity of your symptoms. This medicine may be given by mouth (orally) or through an IV tube that is inserted in one of your veins. The goal of treatment is to relieve symptoms by taking care of yourself at home. This may include taking over-the-counter medicines, drinking plenty of fluids, and adding humidity to the air in your home. In some cases, influenza goes away on its own. Severe influenza or complications from influenza may be treated in a hospital. Follow these instructions at home:  Take over-the-counter and prescription medicines only as told by your health care provider.  Use a cool mist humidifier to add humidity to the air in your home. This can make breathing easier.  Rest as needed.  Drink enough fluid to keep your urine clear or pale yellow.  Cover your mouth and nose when you cough or sneeze.  Wash your hands with soap and water often, especially after you cough or sneeze. If soap and water are not available, use hand sanitizer.  Stay home from work or school as told by your health care provider. Unless you are visiting your health care provider, try to avoid leaving home until your fever has been gone for 24 hours without the use of medicine.  Keep all follow-up visits as told  by your health care provider. This is important. How is this prevented?  Getting an annual flu shot is the best way to avoid getting the flu. You may get the flu shot in late summer, fall, or winter. Ask your health care provider when you should get your flu shot.  Wash your hands often or use hand sanitizer often.  Avoid contact with people who are sick during cold and flu season.  Eat a healthy diet, drink plenty of fluids, get enough sleep, and exercise regularly. Contact a health care provider if:  You develop new symptoms.  You have: ? Chest pain. ? Diarrhea. ? A fever.  Your cough gets worse.  You produce more mucus.  You feel nauseous or you vomit. Get help right away if:  You develop shortness of breath or difficulty breathing.  Your skin or nails turn a bluish color.  You have severe pain or stiffness in your neck.  You develop a sudden headache or sudden pain in your face or ear.  You cannot stop vomiting. This information is not intended to replace advice given to you by your  health care provider. Make sure you discuss any questions you have with your health care provider. Document Released: 11/27/2000 Document Revised: 05/07/2016 Document Reviewed: 09/24/2015 Elsevier Interactive Patient Education  2017 [employer].

## 2018-12-02 ENCOUNTER — Ambulatory Visit: Payer: BC Managed Care – PPO | Admitting: Physician Assistant

## 2018-12-15 ENCOUNTER — Other Ambulatory Visit: Payer: Self-pay | Admitting: Physician Assistant

## 2018-12-16 ENCOUNTER — Encounter: Payer: Self-pay | Admitting: Physician Assistant

## 2018-12-16 ENCOUNTER — Other Ambulatory Visit: Payer: Self-pay | Admitting: Physician Assistant

## 2018-12-16 MED ORDER — PHENTERMINE HCL 37.5 MG PO TABS: ORAL_TABLET | ORAL | 0 refills | Status: DC

## 2019-01-07 ENCOUNTER — Other Ambulatory Visit: Payer: Self-pay | Admitting: Physician Assistant

## 2019-01-12 ENCOUNTER — Encounter: Payer: Self-pay | Admitting: Physician Assistant

## 2019-01-12 ENCOUNTER — Ambulatory Visit: Payer: BC Managed Care – PPO | Admitting: Physician Assistant

## 2019-01-12 ENCOUNTER — Telehealth: Payer: Self-pay | Admitting: Physician Assistant

## 2019-01-12 VITALS — BP 118/80 | HR 99 | Temp 98.0°F | Ht 64.0 in | Wt 230.0 lb

## 2019-01-12 DIAGNOSIS — R6889 Other general symptoms and signs: Secondary | ICD-10-CM | POA: Diagnosis not present

## 2019-01-12 LAB — POCT RAPID STREP A (OFFICE): Rapid Strep A Screen: POSITIVE — AB

## 2019-01-12 LAB — POC INFLUENZA A&B (BINAX/QUICKVUE)
Influenza A, POC: NEGATIVE
Influenza B, POC: NEGATIVE

## 2019-01-12 MED ORDER — AZITHROMYCIN 250 MG PO TABS: ORAL_TABLET | ORAL | 0 refills | Status: DC

## 2019-01-12 MED ORDER — CLINDAMYCIN HCL 300 MG PO CAPS: 300.0000 mg | ORAL_CAPSULE | Freq: Three times a day (TID) | ORAL | 0 refills | Status: DC

## 2019-01-12 NOTE — Patient Instructions (Signed)
It was great to see you!  Use medication as prescribed: start azithromycin  Push fluids and get plenty of rest. Please return if you are not improving as expected, or if you have high fevers (>101.5) or difficulty swallowing or worsening productive cough.  Call clinic with questions.  I hope you start feeling better soon!

## 2019-01-12 NOTE — Telephone Encounter (Signed)
Copied from CRM [id number]. Topic: Quick Communication - See Telephone Encounter >> Jan 12, 2019  9:53 AM Windy Kalata, NT wrote: CRM for notification. See Telephone encounter for: 01/12/19.  [facility] is calling and states azithromycin (ZITHROMAX) 250 MG tablet  was called in today and it is a drug interaction with hydroxychloroquine (PLAQUENIL) 200 MG tablet and is needing to speak with the nurse.    [phone]

## 2019-01-12 NOTE — Telephone Encounter (Signed)
[facility] told [facility] to cancel Rx for Azithromycin and I sent in new Rx for Clindamycin. [facility]

## 2019-01-12 NOTE — Progress Notes (Signed)
[name] is a 33 y.o. female here for a new problem.  I acted as a Neurosurgeon for [employer], PA-C [name], LPN  History of Present Illness:   Chief Complaint  Patient presents with  . Cough    Cough  This is a new problem. Episode onset: Started on Tuesday. The problem has been gradually worsening. The problem occurs every few hours. The cough is productive of sputum (expectorating dark green sputum). Associated symptoms include chills, ear pain, a fever (101 x 2 days), headaches, nasal congestion (green nasal drainage), postnasal drip and a sore throat. Pertinent negatives include no shortness of breath or wheezing. The symptoms are aggravated by lying down. Risk factors for lung disease include occupational exposure. Treatments tried: Sudafed and Excedrin. The treatment provided no relief. There is no history of asthma, bronchitis or pneumonia.      Past Medical History:  Diagnosis Date  . Acid reflux   . Allergy   . Anxiety   . Depression   . Hyperlipidemia   . Inflammatory arthritis 02/18/2018     Social History   Socioeconomic History  . Marital status: Married    Spouse name: Not on file  . Number of children: Not on file  . Years of education: Not on file  . Highest education level: Not on file  Occupational History  . Not on file  Social Needs  . Financial resource strain: Not on file  . Food insecurity:    Worry: Not on file    Inability: Not on file  . Transportation needs:    Medical: Not on file    Non-medical: Not on file  Tobacco Use  . Smoking status: Never Smoker  . Smokeless tobacco: Never Used  Substance and Sexual Activity  . Alcohol use: No  . Drug use: No  . Sexual activity: Yes    Birth control/protection: None  Lifestyle  . Physical activity:    Days per week: Not on file    Minutes per session: Not on file  . Stress: Not on file  Relationships  . Social connections:    Talks on phone: Not on file    Gets together: Not  on file    Attends religious service: Not on file    Active member of club or organization: Not on file    Attends meetings of clubs or organizations: Not on file    Relationship status: Not on file  . Intimate partner violence:    Fear of current or ex partner: Not on file    Emotionally abused: Not on file    Physically abused: Not on file    Forced sexual activity: Not on file  Other Topics Concern  . Not on file  Social History Narrative   Married with 2 kids -- daughters   Works at [employer] and [employer] (2nd shift)   Live in [place]    Past Surgical History:  Procedure Laterality Date  . PERIPHERALLY INSERTED CENTRAL CATHETER INSERTION    . WISDOM TOOTH EXTRACTION    . WISDOM TOOTH EXTRACTION Bilateral     Family History  Problem Relation Age of Onset  . Hypertension Mother   . Hyperlipidemia Mother   . Depression Sister   . Heart disease Maternal Grandmother   . Heart disease Maternal Grandfather   . Heart disease Paternal Grandmother   . Heart disease Paternal Grandfather   . Diabetes Paternal Aunt   . Diabetes Paternal Uncle   . Breast  cancer Neg Hx   . Colon cancer Neg Hx     Allergies  Allergen Reactions  . Cephalosporins Nausea And Vomiting and Rash  . Sulfamethoxazole-Trimethoprim   . Zofran Nausea And Vomiting and Other (See Comments)    Chest pain and nose bleeds    Current Medications:   Current Outpatient Medications:  .  Azelastine-Fluticasone (DYMISTA) 137-50 MCG/ACT SUSP, Dymista 137 mcg-50 mcg/spray nasal spray 1 spray each nare twice a day, Disp: , Rfl:  .  buPROPion (WELLBUTRIN SR) 150 MG 12 hr tablet, Take 1 tablet (150 mg total) by mouth 2 (two) times daily., Disp: 180 tablet, Rfl: 1 .  citalopram (CELEXA) 40 MG tablet, TAKE 1 TABLET BY MOUTH EVERY DAY, Disp: 90 tablet, Rfl: 1 .  cyclobenzaprine (FLEXERIL) 5 MG tablet, Take 1 tablet (5 mg total) by mouth 3 (three) times daily as needed for muscle spasms., Disp: 20 tablet, Rfl: 0 .   hydroxychloroquine (PLAQUENIL) 200 MG tablet, 2 TABLETS WITH FOOD OR MILK ONCE A DAY, Disp: , Rfl: 2 .  ibuprofen (ADVIL,MOTRIN) 800 MG tablet, , Disp: , Rfl:  .  levocetirizine (XYZAL) 5 MG tablet, levocetirizine 5 mg tablet, Disp: , Rfl:  .  levonorgestrel (MIRENA) 20 MCG/24HR IUD, 1 each by Intrauterine route once. Inserted 01/19/2018, needs to be removed 01/2023., Disp: , Rfl:  .  mometasone (ELOCON) 0.1 % cream, mometasone 0.1 % topical cream, Disp: , Rfl:  .  montelukast (SINGULAIR) 10 MG tablet, Take 10 mg by mouth daily., Disp: , Rfl: 1 .  phentermine (ADIPEX-P) 37.5 MG tablet, Take 1/2 a tablet daily x 2 weeks. May increase to 1 tablet daily after two weeks., Disp: 30 tablet, Rfl: 0 .  promethazine (PHENERGAN) 12.5 MG tablet, Take 1 tablet (12.5 mg total) by mouth every 6 (six) hours as needed for nausea or vomiting., Disp: 30 tablet, Rfl: 0 .  ranitidine (ZANTAC) 150 MG tablet, Take 150 mg by mouth 2 (two) times daily., Disp: , Rfl:  .  traMADol (ULTRAM) 50 MG tablet, Take 50 mg by mouth as needed. , Disp: , Rfl:  .  azithromycin (ZITHROMAX) 250 MG tablet, Take two tablets on day 1, then one daily x 4 days, Disp: 6 tablet, Rfl: 0   Review of Systems:   Review of Systems  Constitutional: Positive for chills and fever (101 x 2 days).  HENT: Positive for ear pain, postnasal drip and sore throat.   Respiratory: Positive for cough. Negative for shortness of breath and wheezing.   Neurological: Positive for headaches.    Vitals:   Vitals:   01/12/19 0924  BP: 118/80  Pulse: 99  Temp: 98 F (36.7 C)  TempSrc: Oral  SpO2: 97%  Weight: 230 lb (104.3 kg)  Height: 5\' 4"  (1.626 m)     Body mass index is 39.48 kg/m.  Physical Exam:   Physical Exam Vitals signs and nursing note reviewed.  Constitutional:      General: She is not in acute distress.    Appearance: She is well-developed. She is not ill-appearing or toxic-appearing.  HENT:     Head: Normocephalic and atraumatic.      Right Ear: Tympanic membrane, ear canal and external ear normal. Tympanic membrane is not erythematous, retracted or bulging.     Left Ear: Tympanic membrane, ear canal and external ear normal. Tympanic membrane is not erythematous, retracted or bulging.     Nose: Mucosal edema, congestion and rhinorrhea present.     Right Sinus: Maxillary  sinus tenderness present. No frontal sinus tenderness.     Left Sinus: Maxillary sinus tenderness present. No frontal sinus tenderness.     Mouth/Throat:     Lips: Pink.     Mouth: Mucous membranes are moist.     Pharynx: Uvula midline. Posterior oropharyngeal erythema present.     Tonsils: No tonsillar exudate. Swelling: 2+ on the right. 2+ on the left.  Eyes:     General: Lids are normal.     Conjunctiva/sclera: Conjunctivae normal.  Neck:     Trachea: Trachea normal.  Cardiovascular:     Rate and Rhythm: Normal rate and regular rhythm.     Heart sounds: Normal heart sounds, S1 normal and S2 normal.  Pulmonary:     Effort: Pulmonary effort is normal.     Breath sounds: Normal breath sounds. No decreased breath sounds, wheezing, rhonchi or rales.  Lymphadenopathy:     Cervical: No cervical adenopathy.  Skin:    General: Skin is warm and dry.  Neurological:     Mental Status: She is alert.  Psychiatric:        Speech: Speech normal.        Behavior: Behavior normal. Behavior is cooperative.     Results for orders placed or performed in visit on 01/12/19  POC Influenza A&B(BINAX/QUICKVUE)  Result Value Ref Range   Influenza A, POC Negative Negative   Influenza B, POC Negative Negative  POCT rapid strep A  Result Value Ref Range   Rapid Strep A Screen Positive (A) Negative    Assessment and Plan:   [name] was seen today for cough.  Diagnoses and all orders for this visit:  Flu-like symptoms -     POC Influenza A&B(BINAX/QUICKVUE) -     POCT rapid strep A  Other orders -     azithromycin (ZITHROMAX) 250 MG tablet; Take two  tablets on day 1, then one daily x 4 days   No red flags on exam. Strep positive, flu negative. Will initiate Azithromcyin (cephalosporin allergic) per orders. Discussed taking medications as prescribed. Reviewed return precautions including worsening fever, SOB, worsening cough or other concerns. Push fluids and rest. I recommend that patient follow-up if symptoms worsen or persist despite treatment x 7-10 days, sooner if needed.  . Reviewed expectations re: course of current medical issues. . Discussed self-management of symptoms. . Outlined signs and symptoms indicating need for more acute intervention. . Patient verbalized understanding and all questions were answered. . See orders for this visit as documented in the electronic medical record. . Patient received an After-Visit Summary.  CMA or LPN served as scribe during this visit. History, Physical, and Plan performed by medical provider. The above documentation has been reviewed and is accurate and complete.  Jarold MottoSamantha Benford Asch, PA-C

## 2019-01-12 NOTE — Telephone Encounter (Signed)
See note

## 2019-01-17 ENCOUNTER — Ambulatory Visit: Payer: BC Managed Care – PPO | Admitting: Physician Assistant

## 2019-02-23 ENCOUNTER — Encounter: Payer: Self-pay | Admitting: Physician Assistant

## 2019-02-23 ENCOUNTER — Other Ambulatory Visit: Payer: Self-pay

## 2019-02-23 ENCOUNTER — Ambulatory Visit (INDEPENDENT_AMBULATORY_CARE_PROVIDER_SITE_OTHER): Payer: BC Managed Care – PPO | Admitting: Physician Assistant

## 2019-02-23 VITALS — BP 120/82 | HR 84 | Temp 98.6°F | Ht 64.0 in | Wt 235.8 lb

## 2019-02-23 DIAGNOSIS — E669 Obesity, unspecified: Secondary | ICD-10-CM | POA: Diagnosis not present

## 2019-02-23 MED ORDER — TOPIRAMATE 25 MG PO TABS: 25.0000 mg | ORAL_TABLET | Freq: Every day | ORAL | 0 refills | Status: DC

## 2019-02-23 MED ORDER — PHENTERMINE HCL 37.5 MG PO TABS: ORAL_TABLET | ORAL | 0 refills | Status: DC

## 2019-02-23 NOTE — Progress Notes (Signed)
[name] is a 33 y.o. female here for a new problem.  History of Present Illness:   Chief Complaint  Patient presents with  . Follow-up    medication refill     HPI   Patient is here today to follow-up on weight loss medication.  She is currently taking phentermine. Started in Nov 2019, but has not taken regularly due to recent URI illnesses.  She is wanting to start taking this more regularly.  Denies any side effects from this medication.  She does get occasional headaches.  No prior hx seizures.     Past Medical History:  Diagnosis Date  . Acid reflux   . Allergy   . Anxiety   . Depression   . Hyperlipidemia   . Inflammatory arthritis 02/18/2018     Social History   Socioeconomic History  . Marital status: Married    Spouse name: Not on file  . Number of children: Not on file  . Years of education: Not on file  . Highest education level: Not on file  Occupational History  . Not on file  Social Needs  . Financial resource strain: Not on file  . Food insecurity:    Worry: Not on file    Inability: Not on file  . Transportation needs:    Medical: Not on file    Non-medical: Not on file  Tobacco Use  . Smoking status: Never Smoker  . Smokeless tobacco: Never Used  Substance and Sexual Activity  . Alcohol use: No  . Drug use: No  . Sexual activity: Yes    Birth control/protection: None  Lifestyle  . Physical activity:    Days per week: Not on file    Minutes per session: Not on file  . Stress: Not on file  Relationships  . Social connections:    Talks on phone: Not on file    Gets together: Not on file    Attends religious service: Not on file    Active member of club or organization: Not on file    Attends meetings of clubs or organizations: Not on file    Relationship status: Not on file  . Intimate partner violence:    Fear of current or ex partner: Not on file    Emotionally abused: Not on file    Physically abused: Not on file   Forced sexual activity: Not on file  Other Topics Concern  . Not on file  Social History Narrative   Married with 2 kids -- daughters   Works at [employer] and [employer] (2nd shift)   Live in [place]    Past Surgical History:  Procedure Laterality Date  . PERIPHERALLY INSERTED CENTRAL CATHETER INSERTION    . WISDOM TOOTH EXTRACTION    . WISDOM TOOTH EXTRACTION Bilateral     Family History  Problem Relation Age of Onset  . Hypertension Mother   . Hyperlipidemia Mother   . Depression Sister   . Heart disease Maternal Grandmother   . Heart disease Maternal Grandfather   . Heart disease Paternal Grandmother   . Heart disease Paternal Grandfather   . Diabetes Paternal Aunt   . Diabetes Paternal Uncle   . Breast cancer Neg Hx   . Colon cancer Neg Hx     Allergies  Allergen Reactions  . Cephalosporins Nausea And Vomiting and Rash  . Sulfamethoxazole-Trimethoprim   . Zofran Nausea And Vomiting and Other (See Comments)    Chest pain and nose bleeds  Current Medications:   Current Outpatient Medications:  .  Azelastine-Fluticasone (DYMISTA) 137-50 MCG/ACT SUSP, Dymista 137 mcg-50 mcg/spray nasal spray 1 spray each nare twice a day, Disp: , Rfl:  .  buPROPion (WELLBUTRIN SR) 150 MG 12 hr tablet, Take 1 tablet (150 mg total) by mouth 2 (two) times daily., Disp: 180 tablet, Rfl: 1 .  citalopram (CELEXA) 40 MG tablet, TAKE 1 TABLET BY MOUTH EVERY DAY, Disp: 90 tablet, Rfl: 1 .  hydroxychloroquine (PLAQUENIL) 200 MG tablet, 2 TABLETS WITH FOOD OR MILK ONCE A DAY, Disp: , Rfl: 2 .  ibuprofen (ADVIL,MOTRIN) 800 MG tablet, , Disp: , Rfl:  .  levocetirizine (XYZAL) 5 MG tablet, levocetirizine 5 mg tablet, Disp: , Rfl:  .  levonorgestrel (MIRENA) 20 MCG/24HR IUD, 1 each by Intrauterine route once. Inserted 01/19/2018, needs to be removed 01/2023., Disp: , Rfl:  .  mometasone (ELOCON) 0.1 % cream, mometasone 0.1 % topical cream, Disp: , Rfl:  .  montelukast (SINGULAIR) 10 MG tablet, Take 10 mg  by mouth daily., Disp: , Rfl: 1 .  phentermine (ADIPEX-P) 37.5 MG tablet, Take 1 tab daily, Disp: 30 tablet, Rfl: 0 .  promethazine (PHENERGAN) 12.5 MG tablet, Take 1 tablet (12.5 mg total) by mouth every 6 (six) hours as needed for nausea or vomiting., Disp: 30 tablet, Rfl: 0 .  ranitidine (ZANTAC) 150 MG tablet, Take 150 mg by mouth 2 (two) times daily., Disp: , Rfl:  .  traMADol (ULTRAM) 50 MG tablet, Take 50 mg by mouth as needed. , Disp: , Rfl:  .  topiramate (TOPAMAX) 25 MG tablet, Take 1 tablet (25 mg total) by mouth daily., Disp: 30 tablet, Rfl: 0   Review of Systems:   Review of Systems  Constitutional: Negative for chills, fever, malaise/fatigue and weight loss.  Respiratory: Negative for shortness of breath.   Cardiovascular: Negative for chest pain, orthopnea, claudication and leg swelling.  Gastrointestinal: Negative for heartburn, nausea and vomiting.  Neurological: Positive for headaches. Negative for dizziness and tingling.    Vitals:   Vitals:   02/23/19 0840  BP: 120/82  Pulse: 84  Temp: 98.6 F (37 C)  TempSrc: Oral  SpO2: 97%  Weight: 235 lb 12.8 oz (107 kg)  Height: 5\' 4"  (1.626 m)     Body mass index is 40.47 kg/m.  Physical Exam:   Physical Exam Vitals signs and nursing note reviewed.  Constitutional:      General: She is not in acute distress.    Appearance: She is well-developed. She is not ill-appearing or toxic-appearing.  Cardiovascular:     Rate and Rhythm: Normal rate and regular rhythm.     Pulses: Normal pulses.     Heart sounds: Normal heart sounds, S1 normal and S2 normal.     Comments: No LE edema Pulmonary:     Effort: Pulmonary effort is normal.     Breath sounds: Normal breath sounds.  Skin:    General: Skin is warm and dry.  Neurological:     Mental Status: She is alert.     GCS: GCS eye subscore is 4. GCS verbal subscore is 5. GCS motor subscore is 6.  Psychiatric:        Speech: Speech normal.        Behavior: Behavior  normal. Behavior is cooperative.       Assessment and Plan:   [name] was seen today for follow-up.  Diagnoses and all orders for this visit:  Obesity, unspecified classification, unspecified obesity  type, unspecified whether serious comorbidity present  Other orders -     topiramate (TOPAMAX) 25 MG tablet; Take 1 tablet (25 mg total) by mouth daily. -     phentermine (ADIPEX-P) 37.5 MG tablet; Take 1 tab daily   Restart phentermine. Will add topamax to see if this helps with cravings and weight loss as well.  Follow-up in 1 month, sooner if symptoms or concerns. If topamax causes side effects, discontinue use.  . Reviewed expectations re: course of current medical issues. . Discussed self-management of symptoms. . Outlined signs and symptoms indicating need for more acute intervention. . Patient verbalized understanding and all questions were answered. . See orders for this visit as documented in the electronic medical record. . Patient received an After-Visit Summary.  Jarold Motto, PA-C

## 2019-03-22 ENCOUNTER — Ambulatory Visit (INDEPENDENT_AMBULATORY_CARE_PROVIDER_SITE_OTHER): Payer: BC Managed Care – PPO | Admitting: Physician Assistant

## 2019-03-22 ENCOUNTER — Telehealth: Payer: Self-pay | Admitting: *Deleted

## 2019-03-22 ENCOUNTER — Encounter: Payer: Self-pay | Admitting: Physician Assistant

## 2019-03-22 ENCOUNTER — Other Ambulatory Visit: Payer: Self-pay | Admitting: Physician Assistant

## 2019-03-22 VITALS — Wt 225.0 lb

## 2019-03-22 DIAGNOSIS — E669 Obesity, unspecified: Secondary | ICD-10-CM

## 2019-03-22 DIAGNOSIS — F330 Major depressive disorder, recurrent, mild: Secondary | ICD-10-CM

## 2019-03-22 DIAGNOSIS — F419 Anxiety disorder, unspecified: Secondary | ICD-10-CM

## 2019-03-22 MED ORDER — PHENTERMINE HCL 37.5 MG PO TABS: ORAL_TABLET | ORAL | 2 refills | Status: DC

## 2019-03-22 MED ORDER — TOPIRAMATE 25 MG PO TABS: 25.0000 mg | ORAL_TABLET | Freq: Every day | ORAL | 0 refills | Status: DC

## 2019-03-22 NOTE — Telephone Encounter (Signed)
Left message on voicemail to call office. Needs follow up with [facility] for medication and weight check.

## 2019-03-22 NOTE — Progress Notes (Signed)
Virtual Visit via Video   I connected with [name] on 03/22/19 at 10:20 AM EDT by a video enabled telemedicine application and verified that I am speaking with the correct person using two identifiers. Location patient: Home Location provider: [facility], Office Persons participating in the virtual visit: Tyrianna Ferrer, Jarold MottoSamantha Keigan Girten, PA, Jarold MottoSamantha Stevie Charter, [place]-C   I discussed the limitations of evaluation and management by telemedicine and the availability of in person appointments. The patient expressed understanding and agreed to proceed.  Subjective:   HPI:  Anxiety & Depression Pt following up today, She is working from home and then working at [employer]. Pt says her mood overall is good. Taking Wellbutrin 150 mg BID and Celexa 40 mg daily. Pt tolerating medications with out any side effects. Pt denies SI/HI.  GAD 7 : Generalized Anxiety Score 03/22/2019 11/02/2018 06/29/2018 05/02/2018  Nervous, Anxious, on Edge 1 2 2 1   Control/stop worrying 1 2 2 2   Worry too much - different things 1 2 2 1   Trouble relaxing 1 2 2 2   Restless 0 0 2 1  Easily annoyed or irritable 1 1 2 2   Afraid - awful might happen 1 0 2 0  Total GAD 7 Score 6 9 14 9   Anxiety Difficulty Somewhat difficult Very difficult Somewhat difficult Somewhat difficult    Depression screen [facility] 2[facility] 03/22/2019 11/02/2018 06/29/2018 05/02/2018 03/21/2018  Decreased Interest 0 2 1 1 1   Down, Depressed, Hopeless 1 2 2 1 2   PHQ - 2 Score 1 4 3 2 3   Altered sleeping 1 2 2 2 2   Tired, decreased energy 1 1 2 1 2   Change in appetite 1 3 0 0 2  Feeling bad or failure about yourself  0 1 2 1 2   Trouble concentrating 0 0 2 1 3   Moving slowly or fidgety/restless 0 0 2 0 1  Suicidal thoughts 0 0 0 0 0  PHQ-9 Score 4 11 13 7 15   Difficult doing work/chores - Somewhat difficult Somewhat difficult Somewhat difficult Somewhat difficult  Some recent data might be hidden    Weight Loss She is currently on topamax 25 mg  daily and phentermine 37.5 mg daily. Tolerating well. She is currently down 10 lb since our last visit. Denies any chest pain, SOB, palpitations, increased HA or irritability.  ROS: See pertinent positives and negatives per HPI.  Patient Active Problem List   Diagnosis Date Noted  . Obesity 06/29/2018  . Inflammatory arthritis 02/18/2018  . Rash 09/14/2017  . Depression 06/28/2017  . Acid reflux 06/28/2017  . Allergy 06/28/2017  . Anxiety 06/28/2017    Social History   Tobacco Use  . Smoking status: Never Smoker  . Smokeless tobacco: Never Used  Substance Use Topics  . Alcohol use: No    Current Outpatient Medications:  .  azelastine (ASTELIN) 0.1 % nasal spray, SPRAY 1 SPRAY INTO EACH NOSTRIL TWICE A DAY, Disp: , Rfl:  .  buPROPion (WELLBUTRIN SR) 150 MG 12 hr tablet, Take 1 tablet (150 mg total) by mouth 2 (two) times daily., Disp: 180 tablet, Rfl: 1 .  citalopram (CELEXA) 40 MG tablet, TAKE 1 TABLET BY MOUTH EVERY DAY, Disp: 90 tablet, Rfl: 1 .  fluticasone (FLONASE) 50 MCG/ACT nasal spray, USE 1 2 SPRAYS IN EACH NOSTRIL ONCE A DAY, Disp: , Rfl:  .  hydroxychloroquine (PLAQUENIL) 200 MG tablet, 2 TABLETS WITH FOOD OR MILK ONCE A DAY, Disp: , Rfl: 2 .  ibuprofen (ADVIL,MOTRIN)  800 MG tablet, Take 800 mg by mouth as needed. , Disp: , Rfl:  .  levocetirizine (XYZAL) 5 MG tablet, Take 5 mg by mouth daily. , Disp: , Rfl:  .  levonorgestrel (MIRENA) 20 MCG/24HR IUD, 1 each by Intrauterine route once. Inserted 01/19/2018, needs to be removed 01/2023., Disp: , Rfl:  .  mometasone (ELOCON) 0.1 % cream, Apply 1 application topically daily as needed. , Disp: , Rfl:  .  montelukast (SINGULAIR) 10 MG tablet, Take 10 mg by mouth daily., Disp: , Rfl: 1 .  phentermine (ADIPEX-P) 37.5 MG tablet, Take 1 tab daily, Disp: 30 tablet, Rfl: 2 .  ranitidine (ZANTAC) 150 MG tablet, Take 150 mg by mouth 2 (two) times daily., Disp: , Rfl:  .  topiramate (TOPAMAX) 25 MG tablet, Take 1 tablet (25 mg total) by  mouth daily., Disp: 90 tablet, Rfl: 0 .  traMADol (ULTRAM) 50 MG tablet, Take 50 mg by mouth as needed. , Disp: , Rfl:   Allergies  Allergen Reactions  . Cephalosporins Nausea And Vomiting and Rash  . Sulfamethoxazole-Trimethoprim   . Zofran Nausea And Vomiting and Other (See Comments)    Chest pain and nose bleeds    Objective:   VITALS: Per patient if applicable, see vitals. GENERAL: Alert, appears well and in no acute distress. HEENT: Atraumatic, conjunctiva clear, no obvious abnormalities on inspection of external nose and ears. NECK: Normal movements of the head and neck. CARDIOPULMONARY: No increased WOB. Speaking in clear sentences. I:E ratio WNL.  MS: Moves all visible extremities without noticeable abnormality. PSYCH: Pleasant and cooperative, well-groomed. Speech normal rate and rhythm. Affect is appropriate. Insight and judgement are appropriate. Attention is focused, linear, and appropriate.  NEURO: CN grossly intact. Oriented as arrived to appointment on time with no prompting. Moves both UE equally.  SKIN: No obvious lesions, wounds, erythema, or cyanosis noted on face or hands.  Assessment and Plan:   [name] was seen today for anxiety and f/u on weight loss.  Diagnoses and all orders for this visit:  Obesity, unspecified classification, unspecified obesity type, unspecified whether serious comorbidity present She is having success with topamax and phentermine. Will continue current regimen x 3 months. Follow-up sooner if concerns.  Mild episode of recurrent major depressive disorder (HCC); Anxiety Overall doing well. Continue current regimen of celexa and wellbutrin. Follow-up in 3 months, sooner if concerns.   Other orders -     phentermine (ADIPEX-P) 37.5 MG tablet; Take 1 tab daily -     topiramate (TOPAMAX) 25 MG tablet; Take 1 tablet (25 mg total) by mouth daily.    . Reviewed expectations re: course of current medical issues. . Discussed  self-management of symptoms. . Outlined signs and symptoms indicating need for more acute intervention. . Patient verbalized understanding and all questions were answered. Marland Kitchen Health Maintenance issues including appropriate healthy diet, exercise, and smoking avoidance were discussed with patient. . See orders for this visit as documented in the electronic medical record.  I discussed the assessment and treatment plan with the patient. The patient was provided an opportunity to ask questions and all were answered. The patient agreed with the plan and demonstrated an understanding of the instructions.   The patient was advised to call back or seek an in-person evaluation if the symptoms worsen or if the condition fails to improve as anticipated.  CMA or LPN served as scribe during this visit. History, Physical, and Plan performed by medical provider. The above documentation has been  reviewed and is accurate and complete.   [place], [place] 03/22/2019

## 2019-04-16 ENCOUNTER — Other Ambulatory Visit: Payer: Self-pay | Admitting: Physician Assistant

## 2019-07-06 ENCOUNTER — Telehealth: Payer: Self-pay | Admitting: *Deleted

## 2019-07-06 NOTE — Telephone Encounter (Signed)
Left message on voicemail to call office. Needs to schedule virtual visit with [name] this month for f/u on Anxiety/Depression.

## 2019-07-10 ENCOUNTER — Encounter: Payer: Self-pay | Admitting: Physician Assistant

## 2019-07-10 ENCOUNTER — Ambulatory Visit (INDEPENDENT_AMBULATORY_CARE_PROVIDER_SITE_OTHER): Payer: BC Managed Care – PPO | Admitting: Physician Assistant

## 2019-07-10 VITALS — Ht 64.0 in | Wt 220.0 lb

## 2019-07-10 DIAGNOSIS — F419 Anxiety disorder, unspecified: Secondary | ICD-10-CM | POA: Diagnosis not present

## 2019-07-10 DIAGNOSIS — E669 Obesity, unspecified: Secondary | ICD-10-CM | POA: Diagnosis not present

## 2019-07-10 DIAGNOSIS — F330 Major depressive disorder, recurrent, mild: Secondary | ICD-10-CM | POA: Diagnosis not present

## 2019-07-10 MED ORDER — CITALOPRAM HYDROBROMIDE 40 MG PO TABS: 40.0000 mg | ORAL_TABLET | Freq: Every day | ORAL | 1 refills | Status: DC

## 2019-07-10 MED ORDER — PHENTERMINE HCL 37.5 MG PO TABS: ORAL_TABLET | ORAL | 2 refills | Status: DC

## 2019-07-10 MED ORDER — BUPROPION HCL ER (SR) 150 MG PO TB12: 150.0000 mg | ORAL_TABLET | Freq: Two times a day (BID) | ORAL | 1 refills | Status: DC

## 2019-07-10 NOTE — Progress Notes (Signed)
Virtual Visit via Video   I connected with [name] on 07/10/19 at  7:40 AM EDT by a video enabled telemedicine application and verified that I am speaking with the correct person using two identifiers. Location patient: Home Location provider: [facility], Office Persons participating in the virtual visit: Tobey Baetz, Inda Coke PA-C, Anselmo Pickler LPN   I discussed the limitations of evaluation and management by telemedicine and the availability of in person appointments. The patient expressed understanding and agreed to proceed.  I acted as a Education administrator for [employer], PA-C [employer], LPN  Subjective:   HPI:    Anxiety & Depression Pt following up today. Pt says she has been about the same, but medications are helping her. Currently taking Wellbutrin 150 mg BID and Celexa 40 mg daily. Denies symptoms of SI/HI.  Wt Readings from Last 5 Encounters:  07/10/19 220 lb (99.8 kg)  03/22/19 225 lb (102.1 kg)  02/23/19 235 lb 12.8 oz (107 kg)  01/12/19 230 lb (104.3 kg)  11/28/18 223 lb 6.1 oz (101.3 kg)   Obesity Taking phentermine prn -- denies chest pains, palpitations, headaches or other side effects with use of this medication. Has a "health advocate" who is working with her weekly to set goals. Exercises in home gym as able. Down 5-10 lb since start of COVID.  ROS: See pertinent positives and negatives per HPI.  Patient Active Problem List   Diagnosis Date Noted  . Obesity 06/29/2018  . Inflammatory arthritis 02/18/2018  . Rash 09/14/2017  . Depression 06/28/2017  . Acid reflux 06/28/2017  . Allergy 06/28/2017  . Anxiety 06/28/2017    Social History   Tobacco Use  . Smoking status: Never Smoker  . Smokeless tobacco: Never Used  Substance Use Topics  . Alcohol use: No    Current Outpatient Medications:  .  azelastine (ASTELIN) 0.1 % nasal spray, SPRAY 1 SPRAY INTO EACH NOSTRIL TWICE A DAY, Disp: , Rfl:  .  buPROPion (WELLBUTRIN SR) 150  MG 12 hr tablet, Take 1 tablet (150 mg total) by mouth 2 (two) times daily., Disp: 180 tablet, Rfl: 1 .  citalopram (CELEXA) 40 MG tablet, Take 1 tablet (40 mg total) by mouth daily., Disp: 90 tablet, Rfl: 1 .  famotidine (PEPCID) 40 MG tablet, Take 40 mg by mouth 2 (two) times daily., Disp: , Rfl:  .  fluticasone (FLONASE) 50 MCG/ACT nasal spray, USE 1 2 SPRAYS IN EACH NOSTRIL ONCE A DAY, Disp: , Rfl:  .  hydroxychloroquine (PLAQUENIL) 200 MG tablet, 2 TABLETS WITH FOOD OR MILK ONCE A DAY, Disp: , Rfl: 2 .  ibuprofen (ADVIL,MOTRIN) 800 MG tablet, Take 800 mg by mouth as needed. , Disp: , Rfl:  .  levocetirizine (XYZAL) 5 MG tablet, Take 5 mg by mouth daily. , Disp: , Rfl:  .  levonorgestrel (MIRENA) 20 MCG/24HR IUD, 1 each by Intrauterine route once. Inserted 01/19/2018, needs to be removed 01/2023., Disp: , Rfl:  .  montelukast (SINGULAIR) 10 MG tablet, Take 10 mg by mouth daily., Disp: , Rfl: 1 .  phentermine (ADIPEX-P) 37.5 MG tablet, Take 1 tab daily, Disp: 30 tablet, Rfl: 2 .  topiramate (TOPAMAX) 25 MG tablet, TAKE 1 TABLET BY MOUTH EVERY DAY, Disp: 30 tablet, Rfl: 0 .  traMADol (ULTRAM) 50 MG tablet, Take 50 mg by mouth as needed. , Disp: , Rfl:   Allergies  Allergen Reactions  . Cephalosporins Nausea And Vomiting and Rash  . Sulfamethoxazole-Trimethoprim   .  Zofran Nausea And Vomiting and Other (See Comments)    Chest pain and nose bleeds    Objective:   VITALS: Per patient if applicable, see vitals. GENERAL: Alert, appears well and in no acute distress. HEENT: Atraumatic, conjunctiva clear, no obvious abnormalities on inspection of external nose and ears. NECK: Normal movements of the head and neck. CARDIOPULMONARY: No increased WOB. Speaking in clear sentences. I:E ratio WNL.  MS: Moves all visible extremities without noticeable abnormality. PSYCH: Pleasant and cooperative, well-groomed. Speech normal rate and rhythm. Affect is appropriate. Insight and judgement are appropriate.  Attention is focused, linear, and appropriate.  NEURO: CN grossly intact. Oriented as arrived to appointment on time with no prompting. Moves both UE equally.  SKIN: No obvious lesions, wounds, erythema, or cyanosis noted on face or hands.  Assessment and Plan:   [name] was seen today for anxiety and depression.  Diagnoses and all orders for this visit:  Mild episode of recurrent major depressive disorder (HCC); Anxiety Overall stable. Denies SI/HI. Refill wellbutrin and celexa. Follow-up in 6 months, sooner if issues.  Obesity, unspecified classification, unspecified obesity type, unspecified whether serious comorbidity present Okay to refill phentermine, uses prn. Continue exercise as able.   Other orders -     phentermine (ADIPEX-P) 37.5 MG tablet; Take 1 tab daily -     buPROPion (WELLBUTRIN SR) 150 MG 12 hr tablet; Take 1 tablet (150 mg total) by mouth 2 (two) times daily. -     citalopram (CELEXA) 40 MG tablet; Take 1 tablet (40 mg total) by mouth daily.  . Reviewed expectations re: course of current medical issues. . Discussed self-management of symptoms. . Outlined signs and symptoms indicating need for more acute intervention. . Patient verbalized understanding and all questions were answered. Marland Kitchen. Health Maintenance issues including appropriate healthy diet, exercise, and smoking avoidance were discussed with patient. . See orders for this visit as documented in the electronic medical record.  I discussed the assessment and treatment plan with the patient. The patient was provided an opportunity to ask questions and all were answered. The patient agreed with the plan and demonstrated an understanding of the instructions.   The patient was advised to call back or seek an in-person evaluation if the symptoms worsen or if the condition fails to improve as anticipated.   CMA or LPN served as scribe during this visit. History, Physical, and Plan performed by medical provider. The  above documentation has been reviewed and is accurate and complete.  [place] CampSamantha Hollis [place], [place] 07/10/2019

## 2019-12-18 LAB — CBC AND DIFFERENTIAL
HCT: 42 (ref 36–46)
Hemoglobin: 14.3 (ref 12.0–16.0)
Platelets: 300 (ref 150–399)
WBC: 8.5

## 2019-12-18 LAB — CBC: RBC: 4.34 (ref 3.87–5.11)

## 2020-01-13 ENCOUNTER — Other Ambulatory Visit: Payer: Self-pay | Admitting: Physician Assistant

## 2020-01-19 ENCOUNTER — Encounter: Payer: Self-pay | Admitting: Physician Assistant

## 2020-01-19 ENCOUNTER — Ambulatory Visit: Payer: BC Managed Care – PPO | Attending: Internal Medicine

## 2020-01-19 ENCOUNTER — Ambulatory Visit (INDEPENDENT_AMBULATORY_CARE_PROVIDER_SITE_OTHER): Payer: BC Managed Care – PPO | Admitting: Physician Assistant

## 2020-01-19 VITALS — Ht 64.0 in | Wt 245.0 lb

## 2020-01-19 DIAGNOSIS — R6889 Other general symptoms and signs: Secondary | ICD-10-CM | POA: Diagnosis not present

## 2020-01-19 DIAGNOSIS — Z20822 Contact with and (suspected) exposure to covid-19: Secondary | ICD-10-CM

## 2020-01-19 MED ORDER — AZITHROMYCIN 250 MG PO TABS: ORAL_TABLET | ORAL | 0 refills | Status: DC

## 2020-01-19 NOTE — Progress Notes (Signed)
Virtual Visit via Video   I connected with [name] on 01/19/20 at  9:20 AM EST by a video enabled telemedicine application and verified that I am speaking with the correct person using two identifiers. Location patient: Home Location provider: [facility], Office Persons participating in the virtual visit: [name], Cindy Coke PA-C, Cindy Pickler, LPN   I discussed the limitations of evaluation and management by telemedicine and the availability of in person appointments. The patient expressed understanding and agreed to proceed.  I acted as a Education administrator for [employer], PA-C [employer], LPN  Subjective:   HPI:   Patient is requesting evaluation for possible COVID-19.  Symptom onset: Tuesday  Travel/contacts: No travel or exposure. Both her kids tested Neg for Covid-19 this week. One child has strep Dx yesterday and other child had stomach virus early in the week.  Patient endorses the following symptoms: subjective fever, sinus headache, sinus congestion, sinus pain, rhinorrhea, ear fullness, ear pain, sore throat, difficulty swallowing and myalgia, bodyaches, nausea  Has white patches on the back of her throat.  Patient denies the following symptoms: itchy watery eyes, wheezing, shortness of breath, chest tightness and chest pain, vomiting   Treatments tried: Ibuprofen  Patient risk factors: Current [id number] risk of complications score: 1 Smoking status: [name]  reports that she has never smoked. She has never used smokeless tobacco. If female, currently pregnant? []   Yes [x]   No  ROS: See pertinent positives and negatives per HPI.  Patient Active Problem List   Diagnosis Date Noted  . Obesity 06/29/2018  . Inflammatory arthritis 02/18/2018  . Rash 09/14/2017  . Depression 06/28/2017  . Acid reflux 06/28/2017  . Allergy 06/28/2017  . Anxiety 06/28/2017    Social History   Tobacco Use  . Smoking status: Never Smoker  .  Smokeless tobacco: Never Used  Substance Use Topics  . Alcohol use: No    Current Outpatient Medications:  .  azelastine (ASTELIN) 0.1 % nasal spray, SPRAY 1 SPRAY INTO EACH NOSTRIL TWICE A DAY, Disp: , Rfl:  .  buPROPion (WELLBUTRIN SR) 150 MG 12 hr tablet, TAKE 1 TABLET BY MOUTH TWICE A DAY, Disp: 180 tablet, Rfl: 0 .  citalopram (CELEXA) 40 MG tablet, TAKE 1 TABLET BY MOUTH EVERY DAY, Disp: 90 tablet, Rfl: 0 .  famotidine (PEPCID) 40 MG tablet, Take 40 mg by mouth 2 (two) times daily., Disp: , Rfl:  .  fluticasone (FLONASE) 50 MCG/ACT nasal spray, USE 1 2 SPRAYS IN EACH NOSTRIL ONCE A DAY, Disp: , Rfl:  .  hydroxychloroquine (PLAQUENIL) 200 MG tablet, 2 TABLETS WITH FOOD OR MILK ONCE A DAY, Disp: , Rfl: 2 .  ibuprofen (ADVIL,MOTRIN) 800 MG tablet, Take 800 mg by mouth as needed. , Disp: , Rfl:  .  levocetirizine (XYZAL) 5 MG tablet, Take 5 mg by mouth daily. , Disp: , Rfl:  .  levonorgestrel (MIRENA) 20 MCG/24HR IUD, 1 each by Intrauterine route once. Inserted 01/19/2018, needs to be removed 01/2023., Disp: , Rfl:  .  montelukast (SINGULAIR) 10 MG tablet, Take 10 mg by mouth daily., Disp: , Rfl: 1 .  phentermine (ADIPEX-P) 37.5 MG tablet, Take 1 tab daily, Disp: 30 tablet, Rfl: 2 .  topiramate (TOPAMAX) 25 MG tablet, TAKE 1 TABLET BY MOUTH EVERY DAY, Disp: 30 tablet, Rfl: 0 .  traMADol (ULTRAM) 50 MG tablet, Take 50 mg by mouth as needed. , Disp: , Rfl:  .  azithromycin (ZITHROMAX Z-PAK) 250 MG tablet,  Take 2 tablets ( total of 500 mg) PO on day 1, then 1 tablet ( total of 250 mg) PO q24 x 4 days., Disp: 6 each, Rfl: 0  Allergies  Allergen Reactions  . Cephalosporins Nausea And Vomiting and Rash  . Sulfamethoxazole-Trimethoprim   . Zofran Nausea And Vomiting and Other (See Comments)    Chest pain and nose bleeds    Objective:   VITALS: Per patient if applicable, see vitals. GENERAL: Alert, appears well and in no acute distress. HEENT: Atraumatic, conjunctiva clear, no obvious  abnormalities on inspection of external nose and ears. NECK: Normal movements of the head and neck. CARDIOPULMONARY: No increased WOB. Speaking in clear sentences. I:E ratio WNL.  MS: Moves all visible extremities without noticeable abnormality. PSYCH: Pleasant and cooperative, well-groomed. Speech normal rate and rhythm. Affect is appropriate. Insight and judgement are appropriate. Attention is focused, linear, and appropriate.  NEURO: CN grossly intact. Oriented as arrived to appointment on time with no prompting. Moves both UE equally.  SKIN: No obvious lesions, wounds, erythema, or cyanosis noted on face or hands.  Assessment and Plan:   [name] was seen today for covid symptoms.  Diagnoses and all orders for this visit:  Flu-like symptoms -     Novel Coronavirus, NAA ([facility])  Other orders -     azithromycin (ZITHROMAX Z-PAK) 250 MG tablet; Take 2 tablets ( total of 500 mg) PO on day 1, then 1 tablet ( total of 250 mg) PO q24 x 4 days.   Patient has a respiratory illness without signs of acute distress or respiratory compromise at this time. This is likely a viral infection, which can come from a number of respiratory viruses.   We are going to send patient for COVID-19 testing. As a precaution, they have been advised to remain home until COVID-19 results and then possible further quarantine after that based on results and symptoms. Advised if they experience a "second sickening" or worsening symptoms as the illness progresses, they are to call the office for further instructions or seek emergent evaluation for any severe symptoms.   Given that she has had direct exposure to strep and has white patches on throat, will empirically treat for strep.   . Reviewed expectations re: course of current medical issues. . Discussed self-management of symptoms. . Outlined signs and symptoms indicating need for more acute intervention. . Patient verbalized understanding and all questions were  answered. Marland Kitchen Health Maintenance issues including appropriate healthy diet, exercise, and smoking avoidance were discussed with patient. . See orders for this visit as documented in the electronic medical record.  I discussed the assessment and treatment plan with the patient. The patient was provided an opportunity to ask questions and all were answered. The patient agreed with the plan and demonstrated an understanding of the instructions.   The patient was advised to call back or seek an in-person evaluation if the symptoms worsen or if the condition fails to improve as anticipated.   CMA or LPN served as scribe during this visit. History, Physical, and Plan performed by medical provider. The above documentation has been reviewed and is accurate and complete.   [place], [place] 01/19/2020

## 2020-01-20 LAB — NOVEL CORONAVIRUS, NAA: SARS-CoV-2, NAA: NOT DETECTED

## 2020-01-23 LAB — HEPATIC FUNCTION PANEL
ALT: 27 (ref 7–35)
AST: 14 (ref 13–35)
Alkaline Phosphatase: 65 (ref 25–125)
Bilirubin, Total: 0.4

## 2020-01-23 LAB — BASIC METABOLIC PANEL
BUN: 9 (ref 4–21)
CO2: 27 — AB (ref 13–22)
Chloride: 103 (ref 99–108)
Creatinine: 0.9 (ref 0.5–1.1)
Glucose: 76
Potassium: 4.2 (ref 3.4–5.3)
Sodium: 139 (ref 137–147)

## 2020-01-23 LAB — COMPREHENSIVE METABOLIC PANEL
Albumin: 4 (ref 3.5–5.0)
Calcium: 9.4 (ref 8.7–10.7)
GFR calc Af Amer: 91.83
GFR calc non Af Amer: 75.89
Globulin: 3.4

## 2020-03-29 ENCOUNTER — Encounter: Payer: Self-pay | Admitting: Physician Assistant

## 2020-04-12 ENCOUNTER — Other Ambulatory Visit: Payer: Self-pay | Admitting: Physician Assistant

## 2020-07-14 ENCOUNTER — Other Ambulatory Visit: Payer: Self-pay | Admitting: Physician Assistant

## 2020-08-24 ENCOUNTER — Ambulatory Visit (HOSPITAL_COMMUNITY)
Admission: EM | Admit: 2020-08-24 | Discharge: 2020-08-24 | Disposition: A | Discharge status: Home / Self Care | Payer: BC Managed Care – PPO | Attending: Emergency Medicine | Admitting: Emergency Medicine

## 2020-08-24 ENCOUNTER — Other Ambulatory Visit: Payer: Self-pay

## 2020-08-24 ENCOUNTER — Encounter (HOSPITAL_COMMUNITY): Payer: Self-pay | Admitting: Emergency Medicine

## 2020-08-24 DIAGNOSIS — M79644 Pain in right finger(s): Secondary | ICD-10-CM

## 2020-08-24 MED ORDER — IBUPROFEN 600 MG PO TABS
600.0000 mg | ORAL_TABLET | Freq: Four times a day (QID) | ORAL | 0 refills | Status: DC | PRN
Start: 2020-08-24 — End: 2022-01-14

## 2020-08-24 NOTE — Discharge Instructions (Signed)
Take the ibuprofen as prescribed.  Rest and elevate your hand.  Apply ice packs 2-3 times a day for up to 20 minutes each.  Wear the splint as needed for comfort.    Follow up with your primary care provider or an orthopedist if you symptoms continue or worsen.

## 2020-08-24 NOTE — ED Provider Notes (Signed)
[facility]    CSN: [id number] Arrival date & time: 08/24/20  1129      History   Chief Complaint Chief Complaint  Patient presents with  . Hand Pain    HPI [name] is a 34 y.o. female.   Patient presents with 1 day history of right thumb pain.  She describes the pain as "burning" and radiating up her arm.  No falls or injury.  No known cause.  Medical history is significant for inflammatory arthritis.  Patient denies numbness, weakness, tingling, rash, lesions, erythema, edema, or other symptoms.  No treatments attempted at home.  The history is provided by the patient.    Past Medical History:  Diagnosis Date  . Acid reflux   . Allergy   . Anxiety   . Depression   . Hyperlipidemia   . Inflammatory arthritis 02/18/2018    Patient Active Problem List   Diagnosis Date Noted  . Obesity 06/29/2018  . Inflammatory arthritis 02/18/2018  . Rash 09/14/2017  . Depression 06/28/2017  . Acid reflux 06/28/2017  . Allergy 06/28/2017  . Anxiety 06/28/2017    Past Surgical History:  Procedure Laterality Date  . PERIPHERALLY INSERTED CENTRAL CATHETER INSERTION    . WISDOM TOOTH EXTRACTION    . WISDOM TOOTH EXTRACTION Bilateral     OB History    Gravida  2   Para  2   Term  2   Preterm      AB      Living  2     SAB      TAB      Ectopic      Multiple      Live Births  2            Home Medications    Prior to Admission medications   Medication Sig Start Date End Date Taking? Authorizing Provider  azelastine (ASTELIN) 0.1 % nasal spray SPRAY 1 SPRAY INTO EACH NOSTRIL TWICE A DAY 03/03/19   [provider]  azithromycin (ZITHROMAX Z-PAK) 250 MG tablet Take 2 tablets ( total of 500 mg) PO on day 1, then 1 tablet ( total of 250 mg) PO q24 x 4 days. 01/19/20   Jarold Motto, PA  buPROPion [facility] SR) 150 MG 12 hr tablet TAKE 1 TABLET BY MOUTH TWICE A DAY 07/15/20   Jarold Motto, PA  citalopram (CELEXA) 40 MG tablet  TAKE 1 TABLET BY MOUTH EVERY DAY 07/15/20   Jarold Motto, PA  famotidine (PEPCID) 40 MG tablet Take 40 mg by mouth 2 (two) times daily. 06/12/19   [provider]  fluticasone (FLONASE) 50 MCG/ACT nasal spray USE 1 2 SPRAYS IN EACH NOSTRIL ONCE A DAY 03/03/19   [provider]  hydroxychloroquine (PLAQUENIL) 200 MG tablet 2 TABLETS WITH FOOD OR MILK ONCE A DAY 03/10/18   [provider]  ibuprofen (ADVIL) 600 MG tablet Take 1 tablet (600 mg total) by mouth every 6 (six) hours as needed. 08/24/20   Mickie Bail, NP  levocetirizine (XYZAL) 5 MG tablet Take 5 mg by mouth daily.     [provider]  levonorgestrel (MIRENA) 20 MCG/24HR IUD 1 each by Intrauterine route once. Inserted 01/19/2018, needs to be removed 01/2023.    [provider]  montelukast (SINGULAIR) 10 MG tablet Take 10 mg by mouth daily. 01/17/18   [provider]  phentermine (ADIPEX-P) 37.5 MG tablet Take 1 tab daily 07/10/19   Jarold Motto, PA  topiramate (  TOPAMAX) 25 MG tablet TAKE 1 TABLET BY MOUTH EVERY DAY 04/17/19   Jarold Motto, PA  traMADol (ULTRAM) 50 MG tablet Take 50 mg by mouth as needed.     [provider]    Family History Family History  Problem Relation Age of Onset  . Hypertension Mother   . Hyperlipidemia Mother   . Depression Sister   . Heart disease Maternal Grandmother   . Heart disease Maternal Grandfather   . Heart disease Paternal Grandmother   . Heart disease Paternal Grandfather   . Diabetes Paternal Aunt   . Diabetes Paternal Uncle   . Breast cancer Neg Hx   . Colon cancer Neg Hx     Social History Social History   Tobacco Use  . Smoking status: Never Smoker  . Smokeless tobacco: Never Used  Vaping Use  . Vaping Use: Never used  Substance Use Topics  . Alcohol use: No  . Drug use: No     Allergies   Cephalosporins, Sulfamethoxazole-trimethoprim, and Zofran   Review of Systems Review of Systems  Constitutional:  Negative for chills and fever.  HENT: Negative for ear pain and sore throat.   Eyes: Negative for pain and visual disturbance.  Respiratory: Negative for cough and shortness of breath.   Cardiovascular: Negative for chest pain and palpitations.  Gastrointestinal: Negative for abdominal pain and vomiting.  Genitourinary: Negative for dysuria and hematuria.  Musculoskeletal: Positive for arthralgias. Negative for back pain.  Skin: Negative for color change and rash.  Neurological: Negative for seizures, syncope, weakness and numbness.  All other systems reviewed and are negative.    Physical Exam Triage Vital Signs ED Triage Vitals  Enc Vitals Group     BP 08/24/20 1252 (!) 130/50     Pulse Rate 08/24/20 1252 92     Resp 08/24/20 1252 15     Temp 08/24/20 1252 98 F (36.7 C)     Temp Source 08/24/20 1252 Oral     SpO2 08/24/20 1252 100 %     Weight --      Height --      Head Circumference --      Peak Flow --      Pain Score 08/24/20 1251 9     Pain Loc --      Pain Edu? --      Excl. in GC? --    No data found.  Updated Vital Signs BP (!) 130/50 (BP Location: Left Arm)   Pulse 92   Temp 98 F (36.7 C) (Oral)   Resp 15   SpO2 100%   Visual Acuity Right Eye Distance:   Left Eye Distance:   Bilateral Distance:    Right Eye Near:   Left Eye Near:    Bilateral Near:     Physical Exam Vitals and nursing note reviewed.  Constitutional:      General: She is not in acute distress.    Appearance: She is well-developed. She is not ill-appearing.  HENT:     Head: Normocephalic and atraumatic.     Mouth/Throat:     Mouth: Mucous membranes are moist.     Pharynx: Oropharynx is clear.  Eyes:     Conjunctiva/sclera: Conjunctivae normal.  Cardiovascular:     Rate and Rhythm: Normal rate and regular rhythm.     Heart sounds: No murmur heard.   Pulmonary:     Effort: Pulmonary effort is normal. No respiratory distress.     Breath sounds:  Normal breath sounds.    Abdominal:     Palpations: Abdomen is soft.     Tenderness: There is no abdominal tenderness. There is no guarding or rebound.  Musculoskeletal:        General: Tenderness present. No swelling, deformity or signs of injury. Normal range of motion.       Hands:     Cervical back: Neck supple.     Comments: Right hand/wrist/forearm: Strength 5/5, sensation intact, 2+ pulses, FROM, no lesions, rash, erythema, ecchymosis, edema.    Skin:    General: Skin is warm and dry.     Capillary Refill: Capillary refill takes less than 2 seconds.     Findings: No bruising, erythema, lesion or rash.  Neurological:     General: No focal deficit present.     Mental Status: She is alert and oriented to person, place, and time.     Sensory: No sensory deficit.     Motor: No weakness.     Gait: Gait normal.  Psychiatric:        Mood and Affect: Mood normal.        Behavior: Behavior normal.      UC Treatments / Results  Labs (all labs ordered are listed, but only abnormal results are displayed) Labs Reviewed - No data to display  EKG   Radiology No results found.  Procedures Procedures (including critical care time)  Medications Ordered in UC Medications - No data to display  Initial Impression / Assessment and Plan / UC Course  I have reviewed the triage vital signs and the nursing notes.  Pertinent labs & imaging results that were available during my care of the patient were reviewed by me and considered in my medical decision making (see chart for details).   Right thumb pain.  No trauma; no indication for x-ray.  No evidence of infection.  Treating with ibuprofen, rest, elevation, ice packs, thumb splint.  Instructed patient to follow-up with orthopedics if her symptoms persist or worsen.  Patient agrees to plan of care.      Final Clinical Impressions(s) / UC Diagnoses   Final diagnoses:  Pain of right thumb     Discharge Instructions     Take the ibuprofen as  prescribed.  Rest and elevate your hand.  Apply ice packs 2-3 times a day for up to 20 minutes each.  Wear the splint as needed for comfort.    Follow up with your primary care provider or an orthopedist if you symptoms continue or worsen.       ED Prescriptions    Medication Sig Dispense Auth. Provider   ibuprofen (ADVIL) 600 MG tablet Take 1 tablet (600 mg total) by mouth every 6 (six) hours as needed. 30 tablet Mickie Bail, NP     PDMP not reviewed this encounter.   Mickie Bail, NP 08/24/20 1335

## 2020-08-24 NOTE — ED Triage Notes (Signed)
Pt c/o right thumb pain onset yesterday. She states she has a hx of arthritis which she take plaquenil for daily. She states the pain is muh worse than usual and is radiating up her arm. 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

## 2021-01-03 ENCOUNTER — Other Ambulatory Visit: Payer: Self-pay

## 2021-01-03 ENCOUNTER — Telehealth (INDEPENDENT_AMBULATORY_CARE_PROVIDER_SITE_OTHER): Payer: BC Managed Care – PPO | Admitting: Physician Assistant

## 2021-01-03 ENCOUNTER — Encounter: Payer: Self-pay | Admitting: Physician Assistant

## 2021-01-03 VITALS — Ht 64.0 in | Wt 255.0 lb

## 2021-01-03 DIAGNOSIS — J029 Acute pharyngitis, unspecified: Secondary | ICD-10-CM

## 2021-01-03 DIAGNOSIS — H109 Unspecified conjunctivitis: Secondary | ICD-10-CM

## 2021-01-03 MED ORDER — AZITHROMYCIN 250 MG PO TABS
ORAL_TABLET | ORAL | 0 refills | Status: DC
Start: 2021-01-03 — End: 2021-03-05

## 2021-01-03 NOTE — Progress Notes (Signed)
Virtual Visit via Video   I connected with [name] on 01/03/21 at  2:30 PM EST by a video enabled telemedicine application and verified that I am speaking with the correct person using two identifiers. Location patient: Home Location provider: [facility], Office Persons participating in the virtual visit: Rhilee Carriker, Jarold Motto PA-C, [name], LPN   I discussed the limitations of evaluation and management by telemedicine and the availability of in person appointments. The patient expressed understanding and agreed to proceed.  I acted as a Neurosurgeon for [employer], PA-C [employer], LPN   Subjective:   HPI:   Sore throat Pt c/o sore throat started yesterday afternoon, woke up today with eyes crusted and draining, itching. [name] was dx with strep throat yesterday. Daughter had negative COVID test. She is taking Ibuprofen and allergy medicine.  Denies: fever, chills, neck stiffness, n/v/d, cough  ROS: See pertinent positives and negatives per HPI.  Patient Active Problem List   Diagnosis Date Noted  . Obesity 06/29/2018  . Inflammatory arthritis 02/18/2018  . Rash 09/14/2017  . Depression 06/28/2017  . Acid reflux 06/28/2017  . Allergy 06/28/2017  . Anxiety 06/28/2017    Social History   Tobacco Use  . Smoking status: Never Smoker  . Smokeless tobacco: Never Used  Substance Use Topics  . Alcohol use: No    Current Outpatient Medications:  .  azelastine (ASTELIN) 0.1 % nasal spray, SPRAY 1 SPRAY INTO EACH NOSTRIL TWICE A DAY, Disp: , Rfl:  .  azithromycin (ZITHROMAX) 250 MG tablet, Take two tablets on day 1, then one daily x 4 days, Disp: 6 tablet, Rfl: 0 .  buPROPion (WELLBUTRIN SR) 150 MG 12 hr tablet, TAKE 1 TABLET BY MOUTH TWICE A DAY, Disp: 180 tablet, Rfl: 0 .  citalopram (CELEXA) 40 MG tablet, TAKE 1 TABLET BY MOUTH EVERY DAY, Disp: 90 tablet, Rfl: 0 .  famotidine (PEPCID) 40 MG tablet, Take 40 mg by mouth 2 (two) times  daily., Disp: , Rfl:  .  fluticasone (FLONASE) 50 MCG/ACT nasal spray, USE 1 2 SPRAYS IN EACH NOSTRIL ONCE A DAY, Disp: , Rfl:  .  hydroxychloroquine (PLAQUENIL) 200 MG tablet, 2 TABLETS WITH FOOD OR MILK ONCE A DAY, Disp: , Rfl: 2 .  ibuprofen (ADVIL) 600 MG tablet, Take 1 tablet (600 mg total) by mouth every 6 (six) hours as needed., Disp: 30 tablet, Rfl: 0 .  levocetirizine (XYZAL) 5 MG tablet, Take 5 mg by mouth daily. , Disp: , Rfl:  .  levonorgestrel (MIRENA) 20 MCG/24HR IUD, 1 each by Intrauterine route once. Inserted 01/19/2018, needs to be removed 01/2023., Disp: , Rfl:  .  montelukast (SINGULAIR) 10 MG tablet, Take 10 mg by mouth daily., Disp: , Rfl: 1 .  phentermine (ADIPEX-P) 37.5 MG tablet, Take 1 tab daily, Disp: 30 tablet, Rfl: 2  Allergies  Allergen Reactions  . Cephalosporins Nausea And Vomiting and Rash  . Sulfamethoxazole-Trimethoprim   . Zofran Nausea And Vomiting and Other (See Comments)    Chest pain and nose bleeds    Objective:   VITALS: Per patient if applicable, see vitals. GENERAL: Alert, appears well and in no acute distress. HEENT: Atraumatic, conjunctiva injected bilaterally, no obvious abnormalities on inspection of external nose and ears. NECK: Normal movements of the head and neck. CARDIOPULMONARY: No increased WOB. Speaking in clear sentences. I:E ratio WNL.  MS: Moves all visible extremities without noticeable abnormality. PSYCH: Pleasant and cooperative, well-groomed. Speech normal rate and  rhythm. Affect is appropriate. Insight and judgement are appropriate. Attention is focused, linear, and appropriate.  NEURO: CN grossly intact. Oriented as arrived to appointment on time with no prompting. Moves both UE equally.  SKIN: No obvious lesions, wounds, erythema, or cyanosis noted on face or hands.  Assessment and Plan:   Diagnoses and all orders for this visit:  Pharyngitis, unspecified etiology  Conjunctivitis of both eyes, unspecified conjunctivitis  type  Other orders -     azithromycin (ZITHROMAX) 250 MG tablet; Take two tablets on day 1, then one daily x 4 days   No red flags on discussion. Suspected strep and conjunctivitis. Will treat with azithromycin as she is allergic to cephalosporins. Follow-up if lack of improvement or any worsening.  I discussed the assessment and treatment plan with the patient. The patient was provided an opportunity to ask questions and all were answered. The patient agreed with the plan and demonstrated an understanding of the instructions.   The patient was advised to call back or seek an in-person evaluation if the symptoms worsen or if the condition fails to improve as anticipated.   CMA or LPN served as scribe during this visit. History, Physical, and Plan performed by medical provider. The above documentation has been reviewed and is accurate and complete.   [place], [place] 01/03/2021

## 2021-03-05 ENCOUNTER — Encounter: Payer: Self-pay | Admitting: Physician Assistant

## 2021-03-05 ENCOUNTER — Telehealth (INDEPENDENT_AMBULATORY_CARE_PROVIDER_SITE_OTHER): Payer: BC Managed Care – PPO | Admitting: Physician Assistant

## 2021-03-05 VITALS — Ht 64.0 in | Wt 260.0 lb

## 2021-03-05 DIAGNOSIS — R6889 Other general symptoms and signs: Secondary | ICD-10-CM

## 2021-03-05 MED ORDER — PROMETHAZINE HCL 12.5 MG PO TABS
12.5000 mg | ORAL_TABLET | Freq: Three times a day (TID) | ORAL | 0 refills | Status: DC | PRN
Start: 2021-03-05 — End: 2021-08-26

## 2021-03-05 NOTE — Progress Notes (Signed)
Virtual Visit via Video   I connected with [name] on 03/05/21 at 12:00 PM EDT by a video enabled telemedicine application and verified that I am speaking with the correct person using two identifiers. Location patient: Home Location provider: [facility], Office Persons participating in the virtual visit: Kharter Shelburne, Jarold Motto PA-C, [name], LPN   I discussed the limitations of evaluation and management by telemedicine and the availability of in person appointments. The patient expressed understanding and agreed to proceed.  I acted as a Neurosurgeon for [employer], [employer], LPN\   Subjective:   HPI:   Sore throat Pt c/o waking up middle of night with sore throat. Pt has vomited x 4 this morning. Pt having fever and chills, temp 100.1. Took Ibuprofen and taking sips of water. Both her children are sick are at the doctor now -- they have tested negative for strep, flu and covid.  Denies: lightheadedness, rectal bleeding, inability to keep po's down, neck stiffness  ROS: See pertinent positives and negatives per HPI.  Patient Active Problem List   Diagnosis Date Noted  . Obesity 06/29/2018  . Inflammatory arthritis 02/18/2018  . Rash 09/14/2017  . Depression 06/28/2017  . Acid reflux 06/28/2017  . Allergy 06/28/2017  . Anxiety 06/28/2017    Social History   Tobacco Use  . Smoking status: Never Smoker  . Smokeless tobacco: Never Used  Substance Use Topics  . Alcohol use: No    Current Outpatient Medications:  .  azelastine (ASTELIN) 0.1 % nasal spray, SPRAY 1 SPRAY INTO EACH NOSTRIL TWICE A DAY, Disp: , Rfl:  .  buPROPion (WELLBUTRIN SR) 150 MG 12 hr tablet, TAKE 1 TABLET BY MOUTH TWICE A DAY, Disp: 180 tablet, Rfl: 0 .  citalopram (CELEXA) 40 MG tablet, TAKE 1 TABLET BY MOUTH EVERY DAY, Disp: 90 tablet, Rfl: 0 .  EPINEPHrine 0.3 mg/0.3 mL IJ SOAJ injection, SMARTSIG:0.3 Milliliter(s) IM Once PRN, Disp: , Rfl:  .  famotidine  (PEPCID) 40 MG tablet, Take 40 mg by mouth 2 (two) times daily., Disp: , Rfl:  .  fluticasone (FLONASE) 50 MCG/ACT nasal spray, USE 1 2 SPRAYS IN EACH NOSTRIL ONCE A DAY, Disp: , Rfl:  .  hydroxychloroquine (PLAQUENIL) 200 MG tablet, 2 TABLETS WITH FOOD OR MILK ONCE A DAY, Disp: , Rfl: 2 .  ibuprofen (ADVIL) 600 MG tablet, Take 1 tablet (600 mg total) by mouth every 6 (six) hours as needed., Disp: 30 tablet, Rfl: 0 .  levocetirizine (XYZAL) 5 MG tablet, Take 5 mg by mouth daily. , Disp: , Rfl:  .  levonorgestrel (MIRENA) 20 MCG/24HR IUD, 1 each by Intrauterine route once. Inserted 01/19/2018, needs to be removed 01/2023., Disp: , Rfl:  .  montelukast (SINGULAIR) 10 MG tablet, Take 10 mg by mouth daily., Disp: , Rfl: 1 .  phentermine (ADIPEX-P) 37.5 MG tablet, Take 1 tab daily, Disp: 30 tablet, Rfl: 2 .  promethazine (PHENERGAN) 12.5 MG tablet, Take 1 tablet (12.5 mg total) by mouth every 8 (eight) hours as needed for nausea or vomiting., Disp: 20 tablet, Rfl: 0  Allergies  Allergen Reactions  . Cephalosporins Nausea And Vomiting and Rash  . Sulfamethoxazole-Trimethoprim   . Zofran Nausea And Vomiting and Other (See Comments)    Chest pain and nose bleeds  . Other Rash    Objective:   VITALS: Per patient if applicable, see vitals. GENERAL: Alert, appears well and in no acute distress. HEENT: Atraumatic, conjunctiva clear, no obvious  abnormalities on inspection of external nose and ears. NECK: Normal movements of the head and neck. CARDIOPULMONARY: No increased WOB. Speaking in clear sentences. I:E ratio WNL.  MS: Moves all visible extremities without noticeable abnormality. PSYCH: Pleasant and cooperative, well-groomed. Speech normal rate and rhythm. Affect is appropriate. Insight and judgement are appropriate. Attention is focused, linear, and appropriate.  NEURO: CN grossly intact. Oriented as arrived to appointment on time with no prompting. Moves both UE equally.  SKIN: No obvious  lesions, wounds, erythema, or cyanosis noted on face or hands.  Assessment and Plan:   [name] was seen today for sore throat.  Diagnoses and all orders for this visit:  Flu-like symptoms  Other orders -     promethazine (PHENERGAN) 12.5 MG tablet; Take 1 tablet (12.5 mg total) by mouth every 8 (eight) hours as needed for nausea or vomiting.   No red flags on discussion. Will initiate phenergan prn for n/v per orders. Discussed taking medications as prescribed. Reviewed return precautions including worsening fever, SOB, worsening cough or other concerns. Push fluids and rest. I recommend that patient follow-up if symptoms worsen or persist despite treatment x 7-10 days, sooner if needed.  I discussed the assessment and treatment plan with the patient. The patient was provided an opportunity to ask questions and all were answered. The patient agreed with the plan and demonstrated an understanding of the instructions.   The patient was advised to call back or seek an in-person evaluation if the symptoms worsen or if the condition fails to improve as anticipated.   CMA or LPN served as scribe during this visit. History, Physical, and Plan performed by medical provider. The above documentation has been reviewed and is accurate and complete.  [place], [place] 03/05/2021

## 2021-05-07 ENCOUNTER — Telehealth: Payer: BC Managed Care – PPO | Admitting: Physician Assistant

## 2021-05-07 ENCOUNTER — Encounter: Payer: Self-pay | Admitting: Physician Assistant

## 2021-05-07 DIAGNOSIS — B9689 Other specified bacterial agents as the cause of diseases classified elsewhere: Secondary | ICD-10-CM

## 2021-05-07 DIAGNOSIS — Z91199 Patient's noncompliance with other medical treatment and regimen due to unspecified reason: Secondary | ICD-10-CM

## 2021-05-07 DIAGNOSIS — J019 Acute sinusitis, unspecified: Secondary | ICD-10-CM | POA: Diagnosis not present

## 2021-05-07 DIAGNOSIS — Z5329 Procedure and treatment not carried out because of patient's decision for other reasons: Secondary | ICD-10-CM

## 2021-05-07 MED ORDER — DOXYCYCLINE HYCLATE 100 MG PO CAPS: 100.0000 mg | ORAL_CAPSULE | Freq: Two times a day (BID) | ORAL | 0 refills | Status: DC

## 2021-05-07 NOTE — Progress Notes (Signed)
[name], [name] are scheduled for a virtual visit with your provider today.    Just as we do with appointments in the office, we must obtain your consent to participate.  Your consent will be active for this visit and any virtual visit you may have with one of our providers in the next 365 days.    If you have a MyChart account, I can also send a copy of this consent to you electronically.  All virtual visits are billed to your insurance company just like a traditional visit in the office.  As this is a virtual visit, video technology does not allow for your provider to perform a traditional examination.  This may limit your provider's ability to fully assess your condition.  If your provider identifies any concerns that need to be evaluated in person or the need to arrange testing such as labs, EKG, etc, we will make arrangements to do so.    Although advances in technology are sophisticated, we cannot ensure that it will always work on either your end or our end.  If the connection with a video visit is poor, we may have to switch to a telephone visit.  With either a video or telephone visit, we are not always able to ensure that we have a secure connection.   I need to obtain your verbal consent now.   Are you willing to proceed with your visit today?   [name] has provided verbal consent on 05/07/2021 for a virtual visit (video or telephone).  Cindy Climes, PA-C 05/07/2021  12:00 PM  Virtual Visit via Video   I connected with patient on 05/07/21 at 12:00 PM EDT by a video enabled telemedicine application and verified that I am speaking with the correct person using two identifiers.  Location patient: Home Location provider: [facility] Persons participating in the virtual visit: Patient, Provider  I discussed the limitations of evaluation and management by telemedicine and the availability of in person appointments. The patient expressed understanding and agreed  to proceed.  Subjective:   HPI:   Patient presents via [facility] today complaining of continual worsening of of sinus symptoms.  Patient endorses symptoms starting greater than 5 days ago.  Initially with just nasal congestion, sinus pressure and PND.  Has history of substantial seasonal allergies and thought this was the cause of symptoms.  Since then has developed some mild aches (resolved), sinus pain, ear pain and productive cough.  Denies any recent travel or sick contact.  Takes her multidrug allergy regimen religiously.  Has had a COVID test just to be cautious which was negative.  Note she has substantial history of sinusitis due to her allergies and this feels identical to previous episodes.  Has added on decongestant to her regular regimen which helps but only slightly.     ROS:   See pertinent positives and negatives per HPI.  Patient Active Problem List   Diagnosis Date Noted  . Obesity 06/29/2018  . Inflammatory arthritis 02/18/2018  . Rash 09/14/2017  . Depression 06/28/2017  . Acid reflux 06/28/2017  . Allergy 06/28/2017  . Anxiety 06/28/2017    Social History   Tobacco Use  . Smoking status: Never Smoker  . Smokeless tobacco: Never Used  Substance Use Topics  . Alcohol use: No    Current Outpatient Medications:  .  azelastine (ASTELIN) 0.1 % nasal spray, SPRAY 1 SPRAY INTO EACH NOSTRIL TWICE A DAY, Disp: , Rfl:  .  buPROPion (  WELLBUTRIN SR) 150 MG 12 hr tablet, TAKE 1 TABLET BY MOUTH TWICE A DAY, Disp: 180 tablet, Rfl: 0 .  citalopram (CELEXA) 40 MG tablet, TAKE 1 TABLET BY MOUTH EVERY DAY, Disp: 90 tablet, Rfl: 0 .  EPINEPHrine 0.3 mg/0.3 mL IJ SOAJ injection, SMARTSIG:0.3 Milliliter(s) IM Once PRN, Disp: , Rfl:  .  famotidine (PEPCID) 40 MG tablet, Take 40 mg by mouth 2 (two) times daily., Disp: , Rfl:  .  fluticasone (FLONASE) 50 MCG/ACT nasal spray, USE 1 2 SPRAYS IN EACH NOSTRIL ONCE A DAY, Disp: , Rfl:  .  hydroxychloroquine (PLAQUENIL) 200 MG tablet, 2  TABLETS WITH FOOD OR MILK ONCE A DAY, Disp: , Rfl: 2 .  ibuprofen (ADVIL) 600 MG tablet, Take 1 tablet (600 mg total) by mouth every 6 (six) hours as needed., Disp: 30 tablet, Rfl: 0 .  levocetirizine (XYZAL) 5 MG tablet, Take 5 mg by mouth daily. , Disp: , Rfl:  .  levonorgestrel (MIRENA) 20 MCG/24HR IUD, 1 each by Intrauterine route once. Inserted 01/19/2018, needs to be removed 01/2023., Disp: , Rfl:  .  montelukast (SINGULAIR) 10 MG tablet, Take 10 mg by mouth daily., Disp: , Rfl: 1 .  phentermine (ADIPEX-P) 37.5 MG tablet, Take 1 tab daily, Disp: 30 tablet, Rfl: 2 .  promethazine (PHENERGAN) 12.5 MG tablet, Take 1 tablet (12.5 mg total) by mouth every 8 (eight) hours as needed for nausea or vomiting., Disp: 20 tablet, Rfl: 0  Allergies  Allergen Reactions  . Cephalosporins Nausea And Vomiting and Rash  . Sulfamethoxazole-Trimethoprim   . Zofran Nausea And Vomiting and Other (See Comments)    Chest pain and nose bleeds  . Other Rash    Objective:   There were no vitals taken for this visit.  Patient is well-developed, well-nourished in no acute distress.  Resting comfortably at home.  Head is normocephalic, atraumatic.  No labored breathing.  Speech is clear and coherent with logical content.  Patient is alert and oriented at baseline.   Assessment and Plan:   1. Acute bacterial sinusitis Rx doxycycline.  Increase fluids.  Rest.  Saline nasal spray.  Probiotic.  Mucinex as directed.  Humidifier in bedroom.  Continue allergy medication regimen.  Follow-up as needed if symptoms or not resolving.  - doxycycline (VIBRAMYCIN) 100 MG capsule; Take 1 capsule (100 mg total) by mouth 2 (two) times daily.  Dispense: 20 capsule; Refill: 0    [name], [place] 05/07/2021

## 2021-05-07 NOTE — Patient Instructions (Signed)
Instructions sent to patients MyChart.

## 2021-05-07 NOTE — Progress Notes (Signed)
No show

## 2021-07-09 ENCOUNTER — Encounter: Payer: Self-pay | Admitting: Family Medicine

## 2021-07-09 ENCOUNTER — Telehealth: Payer: BC Managed Care – PPO | Admitting: Family Medicine

## 2021-07-09 DIAGNOSIS — J3089 Other allergic rhinitis: Secondary | ICD-10-CM | POA: Diagnosis not present

## 2021-07-09 DIAGNOSIS — J3489 Other specified disorders of nose and nasal sinuses: Secondary | ICD-10-CM | POA: Diagnosis not present

## 2021-07-09 DIAGNOSIS — R0981 Nasal congestion: Secondary | ICD-10-CM

## 2021-07-09 DIAGNOSIS — J0141 Acute recurrent pansinusitis: Secondary | ICD-10-CM | POA: Diagnosis not present

## 2021-07-09 MED ORDER — AZITHROMYCIN 250 MG PO TABS: ORAL_TABLET | ORAL | 0 refills | Status: DC

## 2021-07-09 MED ORDER — OXYMETAZOLINE HCL 0.05 % NA SOLN: 1.0000 | Freq: Two times a day (BID) | NASAL | 0 refills | Status: AC | PRN

## 2021-07-09 NOTE — Progress Notes (Signed)
[name], [name] are scheduled for a virtual visit with your provider today.    Just as we do with appointments in the office, we must obtain your consent to participate.  Your consent will be active for this visit and any virtual visit you may have with one of our providers in the next 365 days.    If you have a MyChart account, I can also send a copy of this consent to you electronically.  All virtual visits are billed to your insurance company just like a traditional visit in the office.  As this is a virtual visit, video technology does not allow for your provider to perform a traditional examination.  This may limit your provider's ability to fully assess your condition.  If your provider identifies any concerns that need to be evaluated in person or the need to arrange testing such as labs, EKG, etc, we will make arrangements to do so.    Although advances in technology are sophisticated, we cannot ensure that it will always work on either your end or our end.  If the connection with a video visit is poor, we may have to switch to a telephone visit.  With either a video or telephone visit, we are not always able to ensure that we have a secure connection.   I need to obtain your verbal consent now.   Are you willing to proceed with your visit today?   Jaelynne Hockley has provided verbal consent on 07/09/2021 for a virtual visit (video or telephone).   Freddy Finner, NP 07/09/2021  8:54 AM   Date:  07/09/2021   ID:  [name], DOB November 19, 1986, MRN [id number]  Patient Location: Home Provider Location: Home Office   Participants: Patient and Provider for Visit and Wrap up  Method of visit: Video  Location of Patient: Home Location of Provider: Home Office Consent was obtain for visit over the video. Services rendered by provider: Visit was performed via video  A video enabled telemedicine application was used and I verified that I am speaking with the correct person using two  identifiers.  PCP:  Jarold Motto, PA   Chief Complaint:  sinus infection- possible recurrent   History of Present Illness:    [name] is a 35 y.o. female with history as stated below. Presents video telehealth for an acute care visit related to sinus infection- appears to be recurrent. Was treated back at end of May and reports she really never got better after the course of Doxy. Today she says that she tested negative for COVID on Sunday and Monday this week. 7/24-7/25 Clinic tested for Strep- it was negative on Monday as well.  Reports chills, no fever. Reports sore throat, pressure in ears when she swallows.  When asked to palpate sinuses she reports tenderness with touch. She reports her neck feels full and swollen. Reports her drainage is greenish yellow mucus some blood tinge.  And is hoarseness. Her allergies are flared up and her medications are not helping: these include astelin, flonase, xyzal, and singulair. She also reports taking ibuprofen alavert D12  Denies shortness of breath, chest pain.  Past Medical, Surgical, Social History, Allergies, and Medications have been Reviewed.  Past Medical History:  Diagnosis Date   Acid reflux    Allergy    Anxiety    Depression    Hyperlipidemia    Inflammatory arthritis 02/18/2018    Current Meds  Medication Sig   azithromycin (ZITHROMAX) 250 MG tablet Take two  tablets twice daily on first day, and one tablet once daily on days 2-5   oxymetazoline (AFRIN) 0.05 % nasal spray Place 1 spray into both nostrils 2 (two) times daily as needed for up to 3 days for congestion.     Allergies:   Cephalosporins, Sulfamethoxazole-trimethoprim, Zofran, and Other   ROS See HPI for history of present illness.  Physical Exam Constitutional:      Appearance: Normal appearance.  HENT:     Head: Normocephalic.     Nose: Nose normal.  Eyes:     General: Allergic shiner present.        Right eye: Discharge present.         Left eye: Discharge present.    Conjunctiva/sclera:     Right eye: Right conjunctiva is injected.  Pulmonary:     Comments: No shortness of breath in conversation  Musculoskeletal:        General: Normal range of motion.     Cervical back: Normal range of motion.  Neurological:     General: No focal deficit present.     Mental Status: She is alert.  Psychiatric:        Mood and Affect: Mood normal.        Behavior: Behavior normal.        Thought Content: Thought content normal.        Judgment: Judgment normal.              A&P  1. Acute recurrent pansinusitis Ongoing allergery issues causing sinus infections Advised to f/u with PCP about medication changes as needed- she is on several and is still having symtpoms.  Zpak and afrin prescribed  Reviewed side effects, risks and benefits of medication.   Patient acknowledged agreement and understanding of the plan.    - azithromycin (ZITHROMAX) 250 MG tablet; Take two tablets twice daily on first day, and one tablet once daily on days 2-5  Dispense: 6 each; Refill: 0 - oxymetazoline (AFRIN) 0.05 % nasal spray; Place 1 spray into both nostrils 2 (two) times daily as needed for up to 3 days for congestion.  Dispense: 0.6 mL; Refill: 0   2. Nasal congestion with rhinorrhea Afrin for 3 days to help with congestion  - oxymetazoline (AFRIN) 0.05 % nasal spray; Place 1 spray into both nostrils 2 (two) times daily as needed for up to 3 days for congestion.  Dispense: 0.6 mL; Refill: 0    3. Environmental and seasonal allergies -allergy check list given in AVS -afrin to help short term -advised to talk to pcp about medication changes if needed    Time:   Today, I have spent 15 minutes with the patient with telehealth technology discussing the above problems, reviewing the chart, previous notes, medications and orders.   Medication Changes: Meds ordered this encounter  Medications   azithromycin (ZITHROMAX) 250 MG tablet     Sig: Take two tablets twice daily on first day, and one tablet once daily on days 2-5    Dispense:  6 each    Refill:  0    Order Specific Question:   Supervising Provider    Answer:   Hyacinth Meeker, BRIAN [[id number]]   oxymetazoline (AFRIN) 0.05 % nasal spray    Sig: Place 1 spray into both nostrils 2 (two) times daily as needed for up to 3 days for congestion.    Dispense:  0.6 mL    Refill:  0    Order Specific Question:  Supervising Provider    Answer:   Eber Hong [[id number]]     Disposition:  Follow up w pcp if not improved  Signed, Freddy Finner, NP  07/09/2021 8:54 AM

## 2021-07-09 NOTE — Patient Instructions (Signed)
Allergies can cause a lot of symptoms: watery, itching eyes, runny nose (clear), sneezing, sinus pressure, and headaches. This is not making you contagious to others. You can not spread to others or catch this from others. These symptoms happen after you have been exposed to something that you are allergic to an allergen. Prevention: The best prevention is to avoid the things that you know you are allergic to, for example smoke (cigarette, cigar, wood); pollens and molds; animal dander; dust mites. And indoor inhalants such as cleaning products or aerosol sprays.  Target your bedroom as allergy free by removing carpets, damp mopping floors weekly, hanging washable curtains instead of blinds, removing books and stuffed animals, using foam pillows, and encasing pillows and mattress in plastic. Do not blow your nose too frequently or too hard. It may cause your eardrum to perforate (tear). Blow through both nostrils at the same time to equalize pressure.  Use tissue when you blow your nose. Dispose of them and then wash your hands. If no tissue is available, do the "elbow sneeze" into the bend of your arm (away from your open hands). Always wash your hands.  If able use the [facility] [employer] in the house and car to reduce exposure to pollens. Use an air filtration system in your house or buy a small one for your bedroom. Dust your house often, using a cloth and cleaner or polish that keeps the dust from flying into the air. Allergy testing can be done if you have had allergies for a long time and are not doing well on current treatments. Take your medications as directed. If you find the medications are not working let your healthcare provider know. It might take more than one medication to control allergies, especially, seasonal ones.   Oxymetazoline nasal spray What is this medication? Oxymetazoline (OX ee me TAZ oh leen) is a nasal decongestant. This medicine is used to treat nasal congestion or a stuffy nose. This  medicine will not treatan infection. This medicine may be used for other purposes; ask your health care provider orpharmacist if you have questions. COMMON BRAND NAME(S): 12 Hour Nasal, Afrin, Afrin Extra Moisturizing, Afrin Nasal Sinus, Afrin No Drip Severe Congestion, Dristan, Duration, Genasal, Mucinex Children's Stuffy Nose, Mucinex Full Force, Mucinex Moisture Smart, Mucinex Sinus-Max, Mucinex Sinus-Max Sinus & Allergy, NASAL Decongestant, Nasal Relief, Neo-Synephrine 12-Hour, Neo-Synephrine Severe Sinus Congestion, Nostrilla Fast Relief, Sinex 12-Hour, Sudafed OM Sinus Cold Moisturizing, Sudafed OM Sinus Congestion Moisturizing, Vicks Qlearquil Decongestant, Vicks Sinex, Vicks Sinex Severe, Vicks Sinus Daytime, Zicam Extreme CongestionRelief, Zicam Intense Sinus What should I tell my care team before I take this medication? They need to know if you have any of these conditions: diabetes glaucoma heart disease high or low blood pressure history of stroke Raynaud's phenomenon scleroderma Sjogren's syndrome thromboangiitis obliterans thyroid disease trouble urinating due to an enlarged prostate gland an unusual or allergic reaction to oxymetazoline, other medicines, foods, dyes, or preservatives pregnant or trying to get pregnant breast-feeding How should I use this medication? This medicine is for use in the nose. Do not take by mouth. Follow the directions on the package label. Shake well before using. Use your medicine at regular intervals or as directed by your health care provider. Do not use it more often than directed. Do not use for more than 3 days in a row without advice. Make sure that you are using your nasal spray correctly. Ask yourdoctor or health care provider if you have any questions. Talk to your  pediatrician regarding the use of this medicine in children. While this drug may be prescribed for children as young as 6 years for selectedconditions, precautions do  apply. Overdosage: If you think you have taken too much of this medicine contact apoison control center or emergency room at once. NOTE: This medicine is only for you. Do not share this medicine with others. What if I miss a dose? If you miss a dose, use it as soon as you can. If it is almost time for yournext dose, use only that dose. Do not use double or extra doses. What may interact with this medication? The medicine may interaction with the following medications: MAOIs like isocarboxazid, phenelzine, rasagiline, selegiline, and tranylcypromine medicines to treat blood pressure and heart disease like ace-inhibitors, beta-blockers, calcium-channel blockers, digoxin, and diuretics medicines to treat enlarged prostate like alfuzosin, doxazosin, prazosin, and terazosin nafarelin This list may not describe all possible interactions. Give your health care provider a list of all the medicines, herbs, non-prescription drugs, or dietary supplements you use. Also tell them if you smoke, drink alcohol, or use illegaldrugs. Some items may interact with your medicine. What should I watch for while using this medication? Tell your doctor or health care professional if your symptoms do not start toget better or if they get worse. To prevent the spread of infection, do not share bottle with anyone else. What side effects may I notice from receiving this medication? Side effects that you should report to your doctor or health care professionalas soon as possible: allergic reactions like skin rash, itching or hives, swelling of the face, lips, or tongue Side effects that usually do not require medical attention (report to yourdoctor or health care professional if they continue or are bothersome): burning, stinging, or irritation in the nose right after use increased nasal discharge sneezing This list may not describe all possible side effects. Call your doctor for medical advice about side effects. You may  report side effects to FDA at1-800-FDA-1088. Where should I keep my medication? Keep out of the reach of children. Store at room temperature between 20 and 25 degrees C (68 and 77 degrees F).Throw away any unused medicine after the expiration date. NOTE: This sheet is a summary. It may not cover all possible information. If you have questions about this medicine, talk to your doctor, pharmacist, orhealth care provider.  2022 Elsevier/Gold Standard (2016-01-23 13:48:04)  Azithromycin Tablets What is this medication? AZITHROMYCIN (az ith roe MYE sin) treats infections caused by bacteria. It belongs to a group of medications called antibiotics. It will not treat colds,the flu, or infections caused by viruses. This medicine may be used for other purposes; ask your health care provider orpharmacist if you have questions. COMMON BRAND NAME(S): Zithromax, Zithromax Tri-Pak, Zithromax Z-Pak What should I tell my care team before I take this medication? They need to know if you have any of these conditions: History of blood diseases, like leukemia History of irregular heartbeat Kidney disease Liver disease Myasthenia gravis An unusual or allergic reaction to azithromycin, erythromycin, other macrolide antibiotics, foods, dyes, or preservatives Pregnant or trying to get pregnant Breast-feeding How should I use this medication? Take this medication by mouth with a full glass of water. Follow the directions on the prescription label. The tablets can be taken with food or on an empty stomach. If the medication upsets your stomach, take it with food. Take your medication at regular intervals. Do not take your medication more often than directed. Take all of  your medication as directed even if you think you arebetter. Do not skip doses or stop your medication early. Talk to your care team regarding the use of this medication in children. While this medication may be prescribed for children as young as 6  months forselected conditions, precautions do apply. Overdosage: If you think you have taken too much of this medicine contact apoison control center or emergency room at once. NOTE: This medicine is only for you. Do not share this medicine with others. What if I miss a dose? If you miss a dose, take it as soon as you can. If it is almost time for yournext dose, take only that dose. Do not take double or extra doses. What may interact with this medication? Do not take this medication with any of the following: Cisapride Dronedarone Pimozide Thioridazine This medication may also interact with the following: Antacids that contain aluminum or magnesium Birth control pills Colchicine Cyclosporine Digoxin Ergot alkaloids like dihydroergotamine, ergotamine Nelfinavir Other medications that prolong the QT interval (an abnormal heart rhythm) Phenytoin Warfarin This list may not describe all possible interactions. Give your health care provider a list of all the medicines, herbs, non-prescription drugs, or dietary supplements you use. Also tell them if you smoke, drink alcohol, or use illegaldrugs. Some items may interact with your medicine. What should I watch for while using this medication? Tell your care team if your symptoms do not start to get better or if they getworse. This medication may cause serious skin reactions. They can happen weeks to months after starting the medication. Contact your care team right away if you notice fevers or flu-like symptoms with a rash. The rash may be red or purple and then turn into blisters or peeling of the skin. Or, you might notice a red rash with swelling of the face, lips or lymph nodes in your neck or under yourarms. Do not treat diarrhea with over the counter products. Contact your care team ifyou have diarrhea that lasts more than 2 days or if it is severe and watery. This medication can make you more sensitive to the sun. Keep out of the sun. If  you cannot avoid being in the sun, wear protective clothing and use sunscreen.Do not use sun lamps or tanning beds/booths. What side effects may I notice from receiving this medication? Side effects that you should report to your care team as soon as possible: Allergic reactions or angioedema-skin rash, itching, hives, swelling of the face, eyes, lips, tongue, arms, or legs, trouble swallowing or breathing Heart rhythm changes-fast or irregular heartbeat, dizziness, feeling faint or lightheaded, chest pain, trouble breathing Liver injury-right upper belly pain, loss of appetite, nausea, light-colored stool, dark yellow or brown urine, yellowing skin or eyes, unusual weakness or fatigue Rash, fever, and swollen lymph nodes Redness, blistering, peeling, or loosening of the skin, including inside the mouth Severe diarrhea, fever Unusual vaginal discharge, itching, or odor Side effects that usually do not require medical attention (report to your careteam if they continue or are bothersome): Diarrhea Nausea Stomach pain Vomiting This list may not describe all possible side effects. Call your doctor for medical advice about side effects. You may report side effects to FDA at1-800-FDA-1088. Where should I keep my medication? Keep out of the reach of children and pets. Store at room temperature between 15 and 30 degrees C (59 and 86 degrees F).Throw away any unused medication after the expiration date. NOTE: This sheet is a summary. It may not cover  all possible information. If you have questions about this medicine, talk to your doctor, pharmacist, orhealth care provider.  2022 Elsevier/Gold Standard (2020-10-23 11:19:31)

## 2021-08-26 ENCOUNTER — Encounter: Payer: Self-pay | Admitting: Physician Assistant

## 2021-08-26 ENCOUNTER — Other Ambulatory Visit: Payer: Self-pay

## 2021-08-26 ENCOUNTER — Ambulatory Visit (INDEPENDENT_AMBULATORY_CARE_PROVIDER_SITE_OTHER): Payer: BC Managed Care – PPO | Admitting: Physician Assistant

## 2021-08-26 VITALS — BP 110/78 | HR 90 | Temp 98.3°F | Ht 64.0 in | Wt 271.4 lb

## 2021-08-26 DIAGNOSIS — Z0000 Encounter for general adult medical examination without abnormal findings: Secondary | ICD-10-CM | POA: Diagnosis not present

## 2021-08-26 DIAGNOSIS — F330 Major depressive disorder, recurrent, mild: Secondary | ICD-10-CM

## 2021-08-26 DIAGNOSIS — M1990 Unspecified osteoarthritis, unspecified site: Secondary | ICD-10-CM

## 2021-08-26 DIAGNOSIS — R635 Abnormal weight gain: Secondary | ICD-10-CM | POA: Diagnosis not present

## 2021-08-26 DIAGNOSIS — E669 Obesity, unspecified: Secondary | ICD-10-CM

## 2021-08-26 DIAGNOSIS — Z23 Encounter for immunization: Secondary | ICD-10-CM | POA: Diagnosis not present

## 2021-08-26 DIAGNOSIS — F419 Anxiety disorder, unspecified: Secondary | ICD-10-CM

## 2021-08-26 MED ORDER — OZEMPIC (0.25 OR 0.5 MG/DOSE) 2 MG/1.5ML ~~LOC~~ SOPN: 0.5000 mg | PEN_INJECTOR | SUBCUTANEOUS | 0 refills | Status: DC

## 2021-08-26 MED ORDER — TIRZEPATIDE 2.5 MG/0.5ML ~~LOC~~ SOAJ: 2.5000 mg | SUBCUTANEOUS | 0 refills | Status: DC

## 2021-08-26 NOTE — Progress Notes (Signed)
I acted as a Neurosurgeon for [employer], PA-C [employer], LPN   Subjective:    [name] is a 35 y.o. female and is here for a comprehensive physical exam.  HPI  Health Maintenance Due  Topic Date Due   COVID-19 Vaccine (1) Never done   Hepatitis C Screening  Never done   INFLUENZA VACCINE  07/14/2021    Acute Concerns: None  Chronic Issues: Obesity/weight gain-- has had ongoing weight gain due to COVID and busy lifestyle; she does have adequate time to work out Inflammatory arthritis --she sees a rheumatologist at [facility] for this.  She is on Plaquenil for this and tolerating well.  She does have regular eye exams and is actually going to be seeing a neurologist soon due to enlarged vessels. Depression and anxiety --she is currently on Wellbutrin 300 mg daily and Celexa 40 mg daily.  She is overall tolerating this well.  Health Maintenance: Immunizations -- UTD, will give Flu shot today PAP -- UTD, due 2023 Mammo -- N/A Bone Density -- N/A Diet --working on trying to improve better eating habits Sleep habits --denies concerns other than limited time to actually sleep Exercise -- elliptical and rowing machine at home that she is not using Current Weight -- Weight: 271 lb 6.1 oz (123.1 kg)  Weight History: Wt Readings from Last 10 Encounters:  08/26/21 271 lb 6.1 oz (123.1 kg)  03/05/21 260 lb (117.9 kg)  01/03/21 255 lb (115.7 kg)  01/19/20 245 lb (111.1 kg)  07/10/19 220 lb (99.8 kg)  03/22/19 225 lb (102.1 kg)  02/23/19 235 lb 12.8 oz (107 kg)  01/12/19 230 lb (104.3 kg)  11/28/18 223 lb 6.1 oz (101.3 kg)  11/02/18 223 lb 8 oz (101.4 kg)   Body mass index is 46.58 kg/m. Mood --stable with medications No LMP recorded. (Menstrual status: IUD).     reports no history of alcohol use.  Tobacco Use: Low Risk    Smoking Tobacco Use: Never   Smokeless Tobacco Use: Never     Depression screen [facility] 2/9 08/26/2021  Decreased  Interest 0  Down, Depressed, Hopeless 1  PHQ - 2 Score 1  Altered sleeping 2  Tired, decreased energy 1  Change in appetite 1  Feeling bad or failure about yourself  1  Trouble concentrating 0  Moving slowly or fidgety/restless 0  Suicidal thoughts 0  PHQ-9 Score 6  Difficult doing work/chores Somewhat difficult  Some recent data might be hidden     Other providers/specialists: Patient Care Team: Jarold Motto, [place] as PCP - General (Physician Assistant) Lavina Hamman, MD as Consulting Physician (Obstetrics and Gynecology) Casimer Lanius, MD as Consulting Physician (Rheumatology)   PMHx, SurgHx, SocialHx, Medications, and Allergies were reviewed in the Visit Navigator and updated as appropriate.   Past Medical History:  Diagnosis Date   Acid reflux    Allergy    Anxiety    Depression    Hyperlipidemia    Inflammatory arthritis 02/18/2018     Past Surgical History:  Procedure Laterality Date   PERIPHERALLY INSERTED CENTRAL CATHETER INSERTION     WISDOM TOOTH EXTRACTION     WISDOM TOOTH EXTRACTION Bilateral      Family History  Problem Relation Age of Onset   Hypertension Mother    Hyperlipidemia Mother    Depression Sister    Heart disease Maternal Grandmother    Heart disease Maternal Grandfather    Heart disease Paternal Grandmother    Heart  disease Paternal Grandfather    Diabetes Paternal Aunt    Diabetes Paternal Uncle    Breast cancer Neg Hx    Colon cancer Neg Hx     Social History   Tobacco Use   Smoking status: Never   Smokeless tobacco: Never  Vaping Use   Vaping Use: Never used  Substance Use Topics   Alcohol use: No   Drug use: No    Review of Systems:   ROS  Objective:   BP 110/78 (BP Location: Left Arm, Patient Position: Sitting, Cuff Size: Large)   Pulse 90   Temp 98.3 F (36.8 C) (Temporal)   Ht 5\' 4"  (1.626 m)   Wt 271 lb 6.1 oz (123.1 kg)   SpO2 97%   BMI 46.58 kg/m   General Appearance:    Alert, cooperative, no  distress, appears stated age  Head:    Normocephalic, without obvious abnormality, atraumatic  Eyes:    PERRL, conjunctiva/corneas clear, EOM's intact, fundi    benign, both eyes  Ears:    Normal TM's and external ear canals, both ears  Nose:   Nares normal, septum midline, mucosa normal, no drainage    or sinus tenderness  Throat:   Lips, mucosa, and tongue normal; teeth and gums normal  Neck:   Supple, symmetrical, trachea midline, no adenopathy;    thyroid:  no enlargement/tenderness/nodules; no carotid   bruit or JVD  Back:     Symmetric, no curvature, ROM normal, no CVA tenderness  Lungs:     Clear to auscultation bilaterally, respirations unlabored  Chest Wall:    No tenderness or deformity   Heart:    Regular rate and rhythm, S1 and S2 normal, no murmur, rub   or gallop  Breast Exam:  Deferred  Abdomen:     Soft, non-tender, bowel sounds active all four quadrants,    no masses, no organomegaly  Genitalia:  Deferred  Rectal:  Deferred  Extremities:   Extremities normal, atraumatic, no cyanosis or edema  Pulses:   2+ and symmetric all extremities  Skin:   Skin color, texture, turgor normal, no rashes or lesions  Lymph nodes:   Cervical, supraclavicular, and axillary nodes normal  Neurologic:   CNII-XII intact, normal strength, sensation and reflexes    throughout    Assessment/Plan:   1. Routine physical examination Today patient counseled on age appropriate routine health concerns for screening and prevention, each reviewed and up to date or declined. Immunizations reviewed and up to date or declined. Labs ordered and reviewed. Risk factors for depression reviewed and negative. Hearing function and visual acuity are intact. ADLs screened and addressed as needed. Functional ability and level of safety reviewed and appropriate. Education, counseling and referrals performed based on assessed risks today. Patient provided with a copy of personalized plan for preventive  services.   2. Weight gain/obesity We will update blood work to make sure there is no organic cause of symptoms Encourage patient to continue to work on healthy eating habits and start exercising We are going to start 0.25 mg weekly Ozempic injection to see if this will help her with weight loss  3. Need for immunization against influenza Completed today  4. Mild episode of recurrent major depressive disorder (HCC) Doing well with Wellbutrin 300 mg daily and Celexa 40 mg daily Follow-up in 6 months, sooner if concerns I discussed with patient that if they develop any SI, to tell someone immediately and seek medical attention.  5. Anxiety  Doing well with Wellbutrin 300 mg daily and Celexa 40 mg daily Follow-up in 6 months, sooner if concerns I discussed with patient that if they develop any SI, to tell someone immediately and seek medical attention.  6. Inflammatory arthritis Management per rheumatology     Patient Counseling:   [x]     Nutrition: Stressed importance of moderation in sodium/caffeine intake, saturated fat and cholesterol, caloric balance, sufficient intake of fresh fruits, vegetables, fiber, calcium, iron, and 1 mg of folate supplement per day (for females capable of pregnancy).   [x]      Stressed the importance of regular exercise.    [x]     Substance Abuse: Discussed cessation/primary prevention of tobacco, alcohol, or other drug use; driving or other dangerous activities under the influence; availability of treatment for abuse.    [x]      Injury prevention: Discussed safety belts, safety helmets, smoke detector, smoking near bedding or upholstery.    [x]      Sexuality: Discussed sexually transmitted diseases, partner selection, use of condoms, avoidance of unintended pregnancy  and contraceptive alternatives.    [x]     Dental health: Discussed importance of regular tooth brushing, flossing, and dental visits.   [x]      Health maintenance and  immunizations reviewed. Please refer to Health maintenance section.   CMA or LPN served as scribe during this visit. History, Physical, and Plan performed by medical provider. The above documentation has been reviewed and is accurate and complete.   , PA-C [employer] [facility]

## 2021-08-26 NOTE — Patient Instructions (Addendum)
It was great to see you!  Use the rowing machine and elliptical!  Start Ozempic 0.25 mg weekly Please send me a message after 4 weeks if you'd like to continue this  Please go to the lab for blood work.   Our office will call you with your results unless you have chosen to receive results via MyChart.  If your blood work is normal we will follow-up each year for physicals and as scheduled for chronic medical problems.  If anything is abnormal we will treat accordingly and get you in for a follow-up.  Take care,  Lelon Mast

## 2021-08-27 LAB — COMPREHENSIVE METABOLIC PANEL
ALT: 24 U/L (ref 0–35)
AST: 19 U/L (ref 0–37)
Albumin: 4.6 g/dL (ref 3.5–5.2)
Alkaline Phosphatase: 55 U/L (ref 39–117)
BUN: 8 mg/dL (ref 6–23)
CO2: 22 mEq/L (ref 19–32)
Calcium: 9.9 mg/dL (ref 8.4–10.5)
Chloride: 102 mEq/L (ref 96–112)
Creatinine, Ser: 0.71 mg/dL (ref 0.40–1.20)
GFR: 109.88 mL/min (ref >60.00)
Glucose, Bld: 63 mg/dL — ABNORMAL LOW (ref 70–99)
Potassium: 3.8 mEq/L (ref 3.5–5.1)
Sodium: 135 mEq/L (ref 135–145)
Total Bilirubin: 0.6 mg/dL (ref 0.2–1.2)
Total Protein: 7.8 g/dL (ref 6.0–8.3)

## 2021-08-27 LAB — CBC WITH DIFFERENTIAL/PLATELET
Basophils Absolute: 0 10*3/uL (ref 0.0–0.1)
Basophils Relative: 0.5 % (ref 0.0–3.0)
Eosinophils Absolute: 0.2 10*3/uL (ref 0.0–0.7)
Eosinophils Relative: 2 % (ref 0.0–5.0)
HCT: 42.4 % (ref 36.0–46.0)
Hemoglobin: 14.1 g/dL (ref 12.0–15.0)
Lymphocytes Relative: 30 % (ref 12.0–46.0)
Lymphs Abs: 2.7 10*3/uL (ref 0.7–4.0)
MCHC: 33.3 g/dL (ref 30.0–36.0)
MCV: 99.4 fl (ref 78.0–100.0)
Monocytes Absolute: 0.8 10*3/uL (ref 0.1–1.0)
Monocytes Relative: 8.9 % (ref 3.0–12.0)
Neutro Abs: 5.2 10*3/uL (ref 1.4–7.7)
Neutrophils Relative %: 58.6 % (ref 43.0–77.0)
Platelets: 359 10*3/uL (ref 150.0–400.0)
RBC: 4.27 Mil/uL (ref 3.87–5.11)
RDW: 13.4 % (ref 11.5–15.5)
WBC: 8.9 10*3/uL (ref 4.0–10.5)

## 2021-08-27 LAB — LIPID PANEL
Cholesterol: 235 mg/dL — ABNORMAL HIGH (ref 0–200)
HDL: 54.4 mg/dL (ref >39.00)
LDL Cholesterol: 164 mg/dL — ABNORMAL HIGH (ref 0–99)
NonHDL: 180.41
Total CHOL/HDL Ratio: 4
Triglycerides: 84 mg/dL (ref 0.0–149.0)
VLDL: 16.8 mg/dL (ref 0.0–40.0)

## 2021-08-27 LAB — TSH: TSH: 2.08 u[IU]/mL (ref 0.35–5.50)

## 2021-08-27 LAB — HEMOGLOBIN A1C: Hgb A1c MFr Bld: 5.3 % (ref 4.6–6.5)

## 2021-09-24 ENCOUNTER — Other Ambulatory Visit: Payer: Self-pay | Admitting: Physician Assistant

## 2021-09-24 MED ORDER — OZEMPIC (0.25 OR 0.5 MG/DOSE) 2 MG/1.5ML ~~LOC~~ SOPN: 0.2500 mg | PEN_INJECTOR | SUBCUTANEOUS | 1 refills | Status: DC

## 2021-09-26 ENCOUNTER — Ambulatory Visit: Payer: BC Managed Care – PPO | Admitting: Neurology

## 2021-09-26 ENCOUNTER — Encounter: Payer: Self-pay | Admitting: Neurology

## 2021-09-26 ENCOUNTER — Other Ambulatory Visit: Payer: Self-pay

## 2021-09-26 VITALS — BP 119/73 | HR 99 | Ht 64.0 in | Wt 263.5 lb

## 2021-09-26 DIAGNOSIS — G932 Benign intracranial hypertension: Secondary | ICD-10-CM | POA: Diagnosis not present

## 2021-09-26 DIAGNOSIS — R635 Abnormal weight gain: Secondary | ICD-10-CM | POA: Diagnosis not present

## 2021-09-26 DIAGNOSIS — H4710 Unspecified papilledema: Secondary | ICD-10-CM | POA: Insufficient documentation

## 2021-09-26 MED ORDER — TOPIRAMATE 100 MG PO TABS: 100.0000 mg | ORAL_TABLET | Freq: Two times a day (BID) | ORAL | 11 refills | Status: DC

## 2021-09-26 MED ORDER — SUMATRIPTAN SUCCINATE 50 MG PO TABS: 50.0000 mg | ORAL_TABLET | ORAL | 6 refills | Status: DC | PRN

## 2021-09-26 NOTE — Progress Notes (Signed)
Chief Complaint  Patient presents with   New Patient (Initial Visit)    New room, alone. Paper referral from Alma Downs, PA at [facility] for psuedotumor workup, optic nerve edema. Gets intermittent headaches (gets 2x/week), blurry vision (occurs once week)   PCP    Jarold Motto, PA      ASSESSMENT AND PLAN  [name] is a 35 y.o. female  Bilateral papillary edema Obesity, rapid weight gain of 50 pounds over past 2 years Possible intracranial hypertension Complete evaluation with MRI of the brain without contrast If there was no significant structural abnormality, will refer her to fluoroscopy guided lumbar puncture Frequent headaches with migraine features  Topamax 100 mg as preventive medications  Imitrex 50 mg plus Zofran as needed for headache   DIAGNOSTIC DATA (LABS, IMAGING, TESTING) - I reviewed patient records, labs, notes, testing and imaging myself where available.   MEDICAL HISTORY:  [name], is a 35 year old female, seen in request by ophthalmologist Dr. Dione Booze for evaluation of bilateral papillary edema, her primary care PA Alma Downs, [place], initial evaluation was on September 26, 2021  I reviewed and summarized the referring note. PMHx. Inflammatory arthritis, under the care of [facility] Dr. Aryl, taking Plaquenil Depression, anxiety.  Patient was diagnosed with inflammatory arthritis, taking Plaquenil, over the past few years, she was seen by Dr. Alden Hipp on a yearly basis, there was no significant abnormality find in the past, but at his most recent evaluation on July 14, 2021, posterior examination showed nasal disc edema bilaterally, mild blurring of margin, no disc pallor, CDR 0.1 left, 0.15 right eye. 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  She did report rapid weight gain of 50 pounds over the  past 2 years  She had long history of intermittent headache, used to only have couple times each month, since 2022, she has more frequent headaches, at least couple times each week, taking frequent ibuprofen with suboptimal response  She works at a desk job, complains of intermittent blurry vision, especially with sudden positional change, such as bending over,   PHYSICAL EXAM:   Vitals:   09/26/21 0959  BP: 119/73  Pulse: 99  Weight: 263 lb 8 oz (119.5 kg)  Height: 5\' 4"  (1.626 m)   Not recorded     Body mass index is 45.23 kg/m.  PHYSICAL EXAMNIATION:  Gen: NAD, conversant, well nourised, well groomed                     Cardiovascular: Regular rate rhythm, no peripheral edema, warm, nontender. Eyes: Conjunctivae clear without exudates or hemorrhage Neck: Supple, no carotid bruits. Pulmonary: Clear to auscultation bilaterally   NEUROLOGICAL EXAM:  MENTAL STATUS: Speech:    Speech is normal; fluent and spontaneous with normal comprehension.  Cognition:     Orientation to time, place and person     Normal recent and remote memory     Normal Attention span and concentration     Normal Language, naming, repeating,spontaneous speech     Fund of knowledge   CRANIAL NERVES: CN II: Visual fields are full to confrontation. Pupils are round equal and briskly reactive to light. CN III, IV, VI: extraocular movement are normal. No ptosis. CN V: Facial sensation is intact to light touch CN VII: Face is symmetric with normal eye closure  CN VIII: Hearing is normal to causal conversation. CN IX, X:  Phonation is normal. CN XI: Head turning and shoulder shrug are intact  MOTOR: There is no pronator drift of out-stretched arms. Muscle bulk and tone are normal. Muscle strength is normal.  REFLEXES: Reflexes are 2+ and symmetric at the biceps, triceps, knees, and ankles. Plantar responses are flexor.  SENSORY: Intact to light touch, pinprick and vibratory sensation are intact  in fingers and toes.  COORDINATION: There is no trunk or limb dysmetria noted.  GAIT/STANCE: Posture is normal. Gait is steady with normal steps, base, arm swing, and turning. Heel and toe walking are normal. Tandem gait is normal.  Romberg is absent.  REVIEW OF SYSTEMS:  Full 14 system review of systems performed and notable only for as above All other review of systems were negative.   ALLERGIES: Allergies  Allergen Reactions   Cephalosporins Nausea And Vomiting and Rash   Ondansetron Nausea And Vomiting and Other (See Comments)    Chest pain and nose bleeds Chest pain and nose bleeds   Sulfamethoxazole-Trimethoprim    Zofran Nausea And Vomiting and Other (See Comments)    Chest pain and nose bleeds    HOME MEDICATIONS: Current Outpatient Medications  Medication Sig Dispense Refill   azelastine (ASTELIN) 0.1 % nasal spray SPRAY 1 SPRAY INTO EACH NOSTRIL TWICE A DAY     buPROPion (WELLBUTRIN SR) 150 MG 12 hr tablet TAKE 1 TABLET BY MOUTH TWICE A DAY 180 tablet 0   citalopram (CELEXA) 40 MG tablet TAKE 1 TABLET BY MOUTH EVERY DAY 90 tablet 0   EPINEPHrine 0.3 mg/0.3 mL IJ SOAJ injection SMARTSIG:0.3 Milliliter(s) IM Once PRN     famotidine (PEPCID) 40 MG tablet Take 40 mg by mouth 2 (two) times daily.     fluticasone (FLONASE) 50 MCG/ACT nasal spray USE 1 2 SPRAYS IN EACH NOSTRIL ONCE A DAY     hydroxychloroquine (PLAQUENIL) 200 MG tablet 2 TABLETS WITH FOOD OR MILK ONCE A DAY  2   ibuprofen (ADVIL) 600 MG tablet Take 1 tablet (600 mg total) by mouth every 6 (six) hours as needed. 30 tablet 0   levocetirizine (XYZAL) 5 MG tablet Take 5 mg by mouth daily.      levonorgestrel (MIRENA) 20 MCG/24HR IUD 1 each by Intrauterine route once. Inserted 01/19/2018, needs to be removed 01/2023.     montelukast (SINGULAIR) 10 MG tablet Take 10 mg by mouth daily.  1   Semaglutide,0.25 or 0.5MG /DOS, (OZEMPIC, 0.25 OR 0.5 MG/DOSE,) 2 MG/1.5ML SOPN Inject 0.25 mg into the skin once a week. 3 mL 1    No current facility-administered medications for this visit.    PAST MEDICAL HISTORY: Past Medical History:  Diagnosis Date   Acid reflux    Allergy    Anxiety    Depression    Hyperlipidemia    Inflammatory arthritis 02/18/2018    PAST SURGICAL HISTORY: Past Surgical History:  Procedure Laterality Date   PERIPHERALLY INSERTED CENTRAL CATHETER INSERTION     WISDOM TOOTH EXTRACTION     WISDOM TOOTH EXTRACTION Bilateral     FAMILY HISTORY: Family History  Problem Relation Age of Onset   Hypertension Mother    Hyperlipidemia Mother    Depression Sister    Heart disease Maternal Grandmother    Heart disease Maternal Grandfather    Heart disease Paternal Grandmother    Heart disease Paternal Grandfather    Diabetes Paternal Aunt    Diabetes Paternal Uncle    Breast cancer Neg Hx    Colon cancer Neg  Hx     SOCIAL HISTORY: Social History   Socioeconomic History   Marital status: Married    Spouse name: Not on file   Number of children: Not on file   Years of education: Not on file   Highest education level: Not on file  Occupational History   Not on file  Tobacco Use   Smoking status: Never   Smokeless tobacco: Never  Vaping Use   Vaping Use: Never used  Substance and Sexual Activity   Alcohol use: Yes    Comment: very rare   Drug use: No   Sexual activity: Yes    Birth control/protection: None  Other Topics Concern   Not on file  Social History Narrative   Right handed   Coffee- 2 cups per week   Married with 2 kids -- daughters   Works at [employer] and [employer] (2nd shift)   Live in [place]   Social Determinants of Health   Financial Resource Strain: Not on file  Food Insecurity: Not on file  Transportation Needs: Not on file  Physical Activity: Not on file  Stress: Not on file  Social Connections: Not on file  Intimate Partner Violence: Not on file      Levert Feinstein, M.D. Ph.D.  [facility] [address] [place], [place] [address] Ph: [phone] Fax: [phone]  CC:  Alma Downs, [place] [address] [place],  [place] [address]  Jarold Motto, [place]

## 2021-10-01 ENCOUNTER — Ambulatory Visit (INDEPENDENT_AMBULATORY_CARE_PROVIDER_SITE_OTHER): Payer: BC Managed Care – PPO

## 2021-10-01 DIAGNOSIS — G932 Benign intracranial hypertension: Secondary | ICD-10-CM | POA: Diagnosis not present

## 2021-10-06 ENCOUNTER — Telehealth: Payer: Self-pay | Admitting: Neurology

## 2021-10-06 DIAGNOSIS — H4710 Unspecified papilledema: Secondary | ICD-10-CM

## 2021-10-06 NOTE — Telephone Encounter (Signed)
  IMPRESSION: MRI scan of the brain without contrast showing no significant brain parenchymal abnormalities.  Incidental findings of mild paranasal sinusitis and partially empty sella appears to be new compared with previous MRI from 08/06/2011.   Please call patient, MRI of the brain showed no significant abnormality, evidence of partially empty sella, potentially related to her intracranial hypertension  I have referred her to IR fluoroscopy guided lumbar puncture

## 2021-10-06 NOTE — Telephone Encounter (Signed)
Patient returned my call. I discussed her MRI results and recommendations with her. She will proceed with the LP as recommended. Pt verbalized understanding of results. Pt had no questions at this time but was encouraged to call back if questions arise.

## 2021-10-06 NOTE — Telephone Encounter (Signed)
Noted, sent to [facility] at [facility]. She will reach out to the patient to schedule.

## 2021-10-06 NOTE — Telephone Encounter (Signed)
I called patient to discuss. No answer, left a message asking her to call me back. ?

## 2021-10-09 ENCOUNTER — Ambulatory Visit
Admission: RE | Admit: 2021-10-09 | Discharge: 2021-10-09 | Disposition: A | Discharge status: Home / Self Care | Payer: BC Managed Care – PPO | Source: Ambulatory Visit | Attending: Neurology | Admitting: Neurology

## 2021-10-09 ENCOUNTER — Other Ambulatory Visit: Payer: Self-pay

## 2021-10-09 ENCOUNTER — Telehealth: Payer: Self-pay | Admitting: Neurology

## 2021-10-09 VITALS — BP 163/87 | HR 80

## 2021-10-09 DIAGNOSIS — H4710 Unspecified papilledema: Secondary | ICD-10-CM

## 2021-10-09 NOTE — Progress Notes (Signed)
One SST tube of blood drawn from right AC space for LP labs; site unremarkable. 

## 2021-10-09 NOTE — Telephone Encounter (Signed)
Pt is asking for a call back from Emma, RN 

## 2021-10-09 NOTE — Telephone Encounter (Signed)
Please call patient, lumbar puncture today October 09, 2021 showed opening pressure of 40 cmH2O consistent with diagnosis of intracranial hypertension  Spinal fluid testing showed no significant abnormality,(the only part of the spinal fluid testing results come back)  Also check with him has the Topamax 100 mg twice daily, Imitrex helped him?

## 2021-10-09 NOTE — Discharge Instructions (Signed)
Lumbar Puncture Discharge Instructions  Go home and rest quietly as needed. You may resume normal activities; however, do not exert yourself strongly or do any heavy lifting today and tomorrow.   DO NOT drive today.    You may resume your normal diet and medications unless otherwise indicated. Drink a lot of extra fluids today and tomorrow.   The incidence of a spinal headache is about 5% (one in 20 patients).  If you develop a headache when you are sitting up or standing that gets better when you lie down, please lie flat for 24 hours and drink plenty of fluids until the headache goes away.  Caffeinated beverages may be helpful.   If you develop severe nausea and vomiting or a headache that does not go away with the flat bedrest after 48 hours, please call [phone].   Call your physician for a follow-up appointment.  The results of your myelogram will be sent directly to your physician by the following day.  Please call us at [phone] if you have any questions or if complications develop after you arrive home.   Discharge instructions have been explained to the patient.  The patient, or the person responsible for the patient, state they fully understands these instructions.   Thank you for visiting our office today.    

## 2021-10-09 NOTE — Telephone Encounter (Signed)
LVM for pt to call.

## 2021-10-09 NOTE — Telephone Encounter (Signed)
Called pt back. Relayed Dr. Zannie Cove message. She feels improved on topamax and imitrex helping. She will continue these. Has slight HA post LP this am. She will continue to lay flat/rest. Will drink plenty of fluids and will try to take in some caffeine. She will call if headache lingers and does not resolve itself. She will call if she develops any new or worsening sx prior to next appt.

## 2021-11-07 LAB — MULTIPLE SCLEROSIS PANEL 2
Albumin Serum: 5 g/dL (ref 3.5–5.2)
Albumin, CSF: 18.4 mg/dL (ref 8.0–42.0)
CNS-IgG Synthesis Rate: -5.3 mg/24 h (ref <=3.3)
IgG (Immunoglobin G), Serum: 1510 mg/dL (ref 600–1640)
IgG Total CSF: 2.6 mg/dL (ref 0.8–7.7)
IgG-Index: 0.47 (ref <0.66)
Myelin Basic Protein: <2 mcg/L (ref <=4.0)

## 2021-11-07 LAB — FUNGUS CULTURE W SMEAR
CULTURE:: NO GROWTH
MICRO NUMBER:: 12559752
SMEAR:: NONE SEEN
SPECIMEN QUALITY:: ADEQUATE

## 2021-11-07 LAB — CSF CELL COUNT WITH DIFFERENTIAL
RBC Count, CSF: 0 cells/uL
WBC, CSF: 3 cells/uL (ref 0–5)

## 2021-11-07 LAB — GRAM STAIN
MICRO NUMBER:: 12559751
SPECIMEN QUALITY:: ADEQUATE

## 2021-11-07 LAB — PROTEIN, CSF: Total Protein, CSF: 35 mg/dL (ref 15–45)

## 2021-11-07 LAB — GLUCOSE, CSF: Glucose, CSF: 50 mg/dL (ref 40–80)

## 2021-11-07 LAB — VDRL, CSF: VDRL Quant, CSF: NONREACTIVE

## 2021-11-14 LAB — CBC AND DIFFERENTIAL
HCT: 41 (ref 36–46)
Hemoglobin: 13.9 (ref 12.0–16.0)
Neutrophils Absolute: 3.6
WBC: 6.5

## 2021-11-14 LAB — COMPREHENSIVE METABOLIC PANEL
Albumin: 4.1 (ref 3.5–5.0)
Calcium: 9 (ref 8.7–10.7)
Globulin: 3.7

## 2021-11-14 LAB — HEPATIC FUNCTION PANEL
ALT: 22 U/L (ref 7–35)
AST: 16 (ref 13–35)
Alkaline Phosphatase: 65 (ref 25–125)
Bilirubin, Total: 0.4

## 2021-11-14 LAB — BASIC METABOLIC PANEL
BUN: 7 (ref 4–21)
CO2: 23 — AB (ref 13–22)
Chloride: 105 (ref 99–108)
Creatinine: 0.8 (ref 0.5–1.1)
Glucose: 80
Potassium: 4 mEq/L (ref 3.5–5.1)
Sodium: 140 (ref 137–147)

## 2021-11-14 LAB — CBC: RBC: 4.3 (ref 3.87–5.11)

## 2021-11-14 LAB — POCT ERYTHROCYTE SEDIMENTATION RATE, NON-AUTOMATED: Sed Rate: 31

## 2021-12-09 ENCOUNTER — Telehealth: Payer: Self-pay | Admitting: Neurology

## 2021-12-09 ENCOUNTER — Ambulatory Visit: Payer: BC Managed Care – PPO | Admitting: Neurology

## 2021-12-09 ENCOUNTER — Encounter: Payer: Self-pay | Admitting: Neurology

## 2021-12-09 VITALS — BP 128/82 | HR 72 | Ht 64.0 in | Wt 248.5 lb

## 2021-12-09 DIAGNOSIS — J3081 Allergic rhinitis due to animal (cat) (dog) hair and dander: Secondary | ICD-10-CM | POA: Insufficient documentation

## 2021-12-09 DIAGNOSIS — H1045 Other chronic allergic conjunctivitis: Secondary | ICD-10-CM | POA: Insufficient documentation

## 2021-12-09 DIAGNOSIS — J309 Allergic rhinitis, unspecified: Secondary | ICD-10-CM | POA: Insufficient documentation

## 2021-12-09 DIAGNOSIS — G932 Benign intracranial hypertension: Secondary | ICD-10-CM | POA: Diagnosis not present

## 2021-12-09 DIAGNOSIS — J301 Allergic rhinitis due to pollen: Secondary | ICD-10-CM | POA: Insufficient documentation

## 2021-12-09 NOTE — Patient Instructions (Signed)
Great to see you today! Continue current medications Keep up the good work with weight loss! I will get records from Dr. Dione Booze  See you back in 6 months

## 2021-12-09 NOTE — Telephone Encounter (Signed)
Can you please request the records from her most recent visit with Dr. Dione Booze. Thanks!

## 2021-12-09 NOTE — Progress Notes (Addendum)
PATIENT: [name] DOB: Jul 11, 1986  REASON FOR VISIT: Follow up for IIH HISTORY FROM: Patient PRIMARY NEUROLOGIST: Dr. Terrace Arabia  HISTORY  [name], is a 35 year old female, seen in request by ophthalmologist Dr. Dione Booze for evaluation of bilateral papillary edema, her primary care PA Alma Downs, [place], initial evaluation was on September 26, 2021   I reviewed and summarized the referring note. PMHx. Inflammatory arthritis, under the care of [facility] Dr. Aryl, taking Plaquenil Depression, anxiety.   Patient was diagnosed with inflammatory arthritis, taking Plaquenil, over the past few years, she was seen by Dr. Alden Hipp on a yearly basis, there was no significant abnormality find in the past, but at his most recent evaluation on July 14, 2021, posterior examination showed nasal disc edema bilaterally, mild blurring of margin, no disc pallor, CDR 0.1 left, 0.15 right eye. 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   She did report rapid weight gain of 50 pounds over the past 2 years  She had long history of intermittent headache, used to only have couple times each month, since 2022, she has more frequent headaches, at least couple times each week, taking frequent ibuprofen with suboptimal response  She works at a desk job, complains of intermittent blurry vision, especially with sudden positional change, such as bending over,  Update December 09, 2021 SS: MRI of the brain without contrast in October 2022 showed no significant abnormality, there was evidence of 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  LP on 10/09/21 showed opening pressure of 40 cm water consistent with diagnosis of IIH.  Doing well on Topamax 200 mg at bedtime. Headaches are improved, less frequent, are 2-3 a month, less intense. Takes Imitrex, with good benefit, does  make her nauseated. Saw Dr. Dione Booze in November, swelling was improved. Will follow up in 1 year with him. Has lost 15 lbs since October. Has been doing walking for exercise. Works 2 jobs, at [employer] and [employer], has 2 daughters (8 & 10).  REVIEW OF SYSTEMS: Out of a complete 14 system review of symptoms, the patient complains only of the following symptoms, and all other reviewed systems are negative.  See HPI  ALLERGIES: Allergies  Allergen Reactions   Cephalosporins Nausea And Vomiting and Rash   Ondansetron Nausea And Vomiting and Other (See Comments)    Chest pain and nose bleeds    Zofran Nausea And Vomiting and Other (See Comments)    Chest pain and nose bleeds   Sulfamethoxazole-Trimethoprim     HOME MEDICATIONS: Outpatient Medications Prior to Visit  Medication Sig Dispense Refill   azelastine (ASTELIN) 0.1 % nasal spray SPRAY 1 SPRAY INTO EACH NOSTRIL TWICE A DAY     buPROPion (WELLBUTRIN SR) 150 MG 12 hr tablet TAKE 1 TABLET BY MOUTH TWICE A DAY 180 tablet 0   citalopram (CELEXA) 40 MG tablet TAKE 1 TABLET BY MOUTH EVERY DAY 90 tablet 0   EPINEPHrine 0.3 mg/0.3 mL IJ SOAJ injection SMARTSIG:0.3 Milliliter(s) IM Once PRN     famotidine (PEPCID) 40 MG tablet Take 40 mg by mouth 2 (two) times daily.     fluticasone (FLONASE) 50 MCG/ACT nasal spray USE 1 2 SPRAYS IN EACH NOSTRIL ONCE A DAY     hydroxychloroquine (PLAQUENIL) 200 MG tablet 2 TABLETS WITH FOOD OR MILK ONCE A DAY  2   ibuprofen (ADVIL) 600 MG tablet Take 1 tablet (600 mg  total) by mouth every 6 (six) hours as needed. 30 tablet 0   levocetirizine (XYZAL) 5 MG tablet Take 5 mg by mouth daily.      levonorgestrel (MIRENA) 20 MCG/24HR IUD 1 each by Intrauterine route once. Inserted 01/19/2018, needs to be removed 01/2023.     montelukast (SINGULAIR) 10 MG tablet Take 10 mg by mouth daily.  1   Semaglutide,0.25 or 0.5MG /DOS, (OZEMPIC, 0.25 OR 0.5 MG/DOSE,) 2 MG/1.5ML SOPN Inject 0.25 mg into the skin once a week. 3 mL 1    SUMAtriptan (IMITREX) 50 MG tablet Take 1 tablet (50 mg total) by mouth every 2 (two) hours as needed for migraine. May repeat in 2 hours if headache persists or recurs. 12 tablet 6   topiramate (TOPAMAX) 100 MG tablet Take 1 tablet (100 mg total) by mouth 2 (two) times daily. 60 tablet 11   No facility-administered medications prior to visit.    PAST MEDICAL HISTORY: Past Medical History:  Diagnosis Date   Acid reflux    Allergy    Anxiety    Depression    Hyperlipidemia    Inflammatory arthritis 02/18/2018    PAST SURGICAL HISTORY: Past Surgical History:  Procedure Laterality Date   PERIPHERALLY INSERTED CENTRAL CATHETER INSERTION     WISDOM TOOTH EXTRACTION     WISDOM TOOTH EXTRACTION Bilateral     FAMILY HISTORY: Family History  Problem Relation Age of Onset   Hypertension Mother    Hyperlipidemia Mother    Depression Sister    Heart disease Maternal Grandmother    Heart disease Maternal Grandfather    Heart disease Paternal Grandmother    Heart disease Paternal Grandfather    Diabetes Paternal Aunt    Diabetes Paternal Uncle    Breast cancer Neg Hx    Colon cancer Neg Hx     SOCIAL HISTORY: Social History   Socioeconomic History   Marital status: Married    Spouse name: Not on file   Number of children: Not on file   Years of education: Not on file   Highest education level: Not on file  Occupational History   Not on file  Tobacco Use   Smoking status: Never   Smokeless tobacco: Never  Vaping Use   Vaping Use: Never used  Substance and Sexual Activity   Alcohol use: Yes    Comment: very rare   Drug use: No   Sexual activity: Yes    Birth control/protection: None  Other Topics Concern   Not on file  Social History Narrative   Right handed   Coffee- 2 cups per week   Married with 2 kids -- daughters   Works at [employer] and [employer] (2nd shift)   Live in [place]   Social Determinants of Health   Financial Resource Strain: Not on file  Food  Insecurity: Not on file  Transportation Needs: Not on file  Physical Activity: Not on file  Stress: Not on file  Social Connections: Not on file  Intimate Partner Violence: Not on file   PHYSICAL EXAM  Vitals:   12/09/21 0930  BP: 128/82  Pulse: 72  Weight: 248 lb 8 oz (112.7 kg)  Height: 5\' 4"  (1.626 m)   Body mass index is 42.65 kg/m.  Generalized: Well developed, in no acute distress   Neurological examination  Mentation: Alert oriented to time, place, history taking. Follows all commands speech and language fluent Cranial nerve II-XII: Pupils were equal round reactive to light. Extraocular movements were  full, visual field were full on confrontational test. Facial sensation and strength were normal. Head turning and shoulder shrug  were normal and symmetric. Motor: The motor testing reveals 5 over 5 strength of all 4 extremities. Good symmetric motor tone is noted throughout.  Sensory: Sensory testing is intact to soft touch on all 4 extremities. No evidence of extinction is noted.  Coordination: Cerebellar testing reveals good finger-nose-finger and heel-to-shin bilaterally.  Gait and station: Gait is normal. Reflexes: Deep tendon reflexes are symmetric and normal bilaterally.   DIAGNOSTIC DATA (LABS, IMAGING, TESTING) - I reviewed patient records, labs, notes, testing and imaging myself where available.  Lab Results  Component Value Date   WBC 8.9 08/26/2021   HGB 14.1 08/26/2021   HCT 42.4 08/26/2021   MCV 99.4 08/26/2021   PLT 359.0 08/26/2021      Component Value Date/Time   NA 135 08/26/2021 1626   NA 139 01/23/2020 0000   K 3.8 08/26/2021 1626   CL 102 08/26/2021 1626   CO2 22 08/26/2021 1626   GLUCOSE 63 (L) 08/26/2021 1626   BUN 8 08/26/2021 1626   BUN 9 01/23/2020 0000   CREATININE 0.71 08/26/2021 1626   CALCIUM 9.9 08/26/2021 1626   PROT 7.8 08/26/2021 1626   ALBUMIN 5.0 10/09/2021 0901   AST 19 08/26/2021 1626   ALT 24 08/26/2021 1626   ALKPHOS  55 08/26/2021 1626   BILITOT 0.6 08/26/2021 1626   GFRNONAA 75.89 01/23/2020 0000   GFRAA 91.83 01/23/2020 0000   Lab Results  Component Value Date   CHOL 235 (H) 08/26/2021   HDL 54.40 08/26/2021   LDLCALC 164 (H) 08/26/2021   TRIG 84.0 08/26/2021   CHOLHDL 4 08/26/2021   Lab Results  Component Value Date   HGBA1C 5.3 08/26/2021   No results found for: VITAMINB12 Lab Results  Component Value Date   TSH 2.08 08/26/2021      ASSESSMENT AND PLAN 35 y.o. year old female  has a past medical history of Acid reflux, Allergy, Anxiety, Depression, Hyperlipidemia, and Inflammatory arthritis (02/18/2018). here with:  1.  Intracranial hypertension 2.  Bilateral papilledema 3.  Obesity 4.  Frequent headaches with migraine features  -Symptoms improved, much less frequent headache, 2-3/ month -Continue Topamax 200 mg at bedtime, continue Imitrex as needed for acute headache -Will request most recent office visit from Dr. Dione Booze (in November) -Has done excellent to lose almost 20 pounds since last seen in October!! 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 -LP on 10/09/21 showed opening pressure of 40 cm water consistent with diagnosis of IIH -Follow-up in 6 months or sooner if needed  Addendum 12/10/21 SS: Received recent Dr. Dione Booze visit 11/04/21, decreasing edema both eyes.  I spent 30 minutes of face-to-face and non-face-to-face time with patient.  This included previsit chart review, lab review, study review, order entry, electronic health record documentation, patient education on IIH and reviewing testing results.   Margie Ege, AGNP-C, DNP 12/09/2021, 10:08 AM [facility] [address] [place], [place] [address] [phone]

## 2021-12-10 NOTE — Progress Notes (Signed)
Chart reviewed, agree above plan ?

## 2021-12-24 ENCOUNTER — Telehealth: Payer: Self-pay | Admitting: Neurology

## 2021-12-24 NOTE — Telephone Encounter (Signed)
[name] from [place] called wanting to speak to the RN regarding a Dx code for the labwork the had done on 10/09/21 The code they have of H47.10 did not cover all of the tests that were done. Please advise. [id number]

## 2021-12-25 NOTE — Telephone Encounter (Signed)
I spoke to [place] in the pre-billing department at [facility] [phone]). She added the additional information that we are looking for abnormalities in the CSF (R83.9). She will apply this code to be processed. They will call back if anything further is needed from our office.

## 2022-01-14 ENCOUNTER — Ambulatory Visit (INDEPENDENT_AMBULATORY_CARE_PROVIDER_SITE_OTHER)
Admission: RE | Admit: 2022-01-14 | Discharge: 2022-01-14 | Disposition: A | Discharge status: Home / Self Care | Payer: BC Managed Care – PPO | Source: Ambulatory Visit | Attending: Physician Assistant | Admitting: Physician Assistant

## 2022-01-14 ENCOUNTER — Other Ambulatory Visit: Payer: Self-pay

## 2022-01-14 ENCOUNTER — Encounter: Payer: Self-pay | Admitting: Physician Assistant

## 2022-01-14 ENCOUNTER — Ambulatory Visit: Payer: BC Managed Care – PPO | Admitting: Physician Assistant

## 2022-01-14 VITALS — BP 110/70 | HR 100 | Temp 98.5°F | Ht 64.0 in | Wt 249.4 lb

## 2022-01-14 DIAGNOSIS — H9202 Otalgia, left ear: Secondary | ICD-10-CM | POA: Diagnosis not present

## 2022-01-14 DIAGNOSIS — M79675 Pain in left toe(s): Secondary | ICD-10-CM

## 2022-01-14 DIAGNOSIS — J029 Acute pharyngitis, unspecified: Secondary | ICD-10-CM

## 2022-01-14 LAB — POCT RAPID STREP A (OFFICE): Rapid Strep A Screen: POSITIVE — AB

## 2022-01-14 MED ORDER — AMOXICILLIN 500 MG PO TABS: 500.0000 mg | ORAL_TABLET | Freq: Two times a day (BID) | ORAL | 0 refills | Status: DC

## 2022-01-14 NOTE — Patient Instructions (Signed)
It was great to see you!  You have strep and likely a broken toe! Start amoxicillin 500 mg twice daily x 10 days.  Use post-op shoe to keep foot from bending  An order for an xray has been put in for you. To get your xray, you can walk in at the [facility] location without a scheduled appointment.  The address is [address]. It is across the street from [facility]. X-ray is located in the basement.  Hours of operation are M-F 8:30am to 5:00pm. Please note that they are closed for lunch between 12:30 and 1:00pm.   Take care,  Jarold Motto PA-C

## 2022-01-14 NOTE — Progress Notes (Signed)
[name] is a 36 y.o. female here for otalgia and toe pain.  History of Present Illness:   Chief Complaint  Patient presents with   Otalgia    Pt c/o left ear pain since last Thursday, getting worse. Pain down into neck and hurts to swallow. Taking Ibuprofen.   Toe Pain    Pt stubbed her left middle toe, having pain and is ecchymotic and swollen. She did ice it.    HPI  Otalgia; Sore Throat [name] presents with c/o left ear pain that has been onset for about a week, gradually worsening. Pt states the pain radiates down into the left side of her neck and makes it difficult to swallow without experiencing intense pain. In an effort to manage her pain, she has taken ibuprofen which provide minor relief. Denies fever or chills. Denies specific known sick contacts.   Toe Pain In addition to ear pain, pt expresses concern that she possibly broke her left middle toe. She reports on 01/10/22, she stubbed her toe on her child's toy. Upon noticing that her toe was swollen and bruising, she took some ibuprofen and iced her toe, but this provided minor relief. At this time she describes pain as a shooting sensation going through her left foot and up her leg.     Past Medical History:  Diagnosis Date   Acid reflux    Allergy    Anxiety    Depression    Hyperlipidemia    Inflammatory arthritis 02/18/2018     Social History   Tobacco Use   Smoking status: Never   Smokeless tobacco: Never  Vaping Use   Vaping Use: Never used  Substance Use Topics   Alcohol use: Yes    Comment: very rare   Drug use: No    Past Surgical History:  Procedure Laterality Date   PERIPHERALLY INSERTED CENTRAL CATHETER INSERTION     WISDOM TOOTH EXTRACTION     WISDOM TOOTH EXTRACTION Bilateral     Family History  Problem Relation Age of Onset   Hypertension Mother    Hyperlipidemia Mother    Depression Sister    Heart disease Maternal Grandmother    Heart disease Maternal Grandfather    Heart  disease Paternal Grandmother    Heart disease Paternal Grandfather    Diabetes Paternal Aunt    Diabetes Paternal Uncle    Breast cancer Neg Hx    Colon cancer Neg Hx     Allergies  Allergen Reactions   Cephalosporins Nausea And Vomiting and Rash   Ondansetron Nausea And Vomiting and Other (See Comments)    Chest pain and nose bleeds    Zofran Nausea And Vomiting and Other (See Comments)    Chest pain and nose bleeds   Sulfamethoxazole-Trimethoprim     Current Medications:   Current Outpatient Medications:    azelastine (ASTELIN) 0.1 % nasal spray, SPRAY 1 SPRAY INTO EACH NOSTRIL TWICE A DAY, Disp: , Rfl:    buPROPion (WELLBUTRIN SR) 150 MG 12 hr tablet, TAKE 1 TABLET BY MOUTH TWICE A DAY, Disp: 180 tablet, Rfl: 0   citalopram (CELEXA) 40 MG tablet, TAKE 1 TABLET BY MOUTH EVERY DAY, Disp: 90 tablet, Rfl: 0   EPINEPHrine 0.3 mg/0.3 mL IJ SOAJ injection, SMARTSIG:0.3 Milliliter(s) IM Once PRN, Disp: , Rfl:    famotidine (PEPCID) 40 MG tablet, Take 40 mg by mouth 2 (two) times daily., Disp: , Rfl:    fluticasone (FLONASE) 50 MCG/ACT nasal spray, USE 1 2 SPRAYS  IN EACH NOSTRIL ONCE A DAY, Disp: , Rfl:    hydroxychloroquine (PLAQUENIL) 200 MG tablet, 2 TABLETS WITH FOOD OR MILK ONCE A DAY, Disp: , Rfl: 2   ibuprofen (ADVIL) 200 MG tablet, Take 600 mg by mouth every 6 (six) hours as needed., Disp: , Rfl:    levocetirizine (XYZAL) 5 MG tablet, Take 5 mg by mouth daily. , Disp: , Rfl:    levonorgestrel (MIRENA) 20 MCG/24HR IUD, 1 each by Intrauterine route once. Inserted 01/19/2018, needs to be removed 01/2023., Disp: , Rfl:    montelukast (SINGULAIR) 10 MG tablet, Take 10 mg by mouth daily., Disp: , Rfl: 1   Semaglutide,0.25 or 0.5MG /DOS, (OZEMPIC, 0.25 OR 0.5 MG/DOSE,) 2 MG/1.5ML SOPN, Inject 0.25 mg into the skin once a week., Disp: 3 mL, Rfl: 1   SUMAtriptan (IMITREX) 50 MG tablet, Take 1 tablet (50 mg total) by mouth every 2 (two) hours as needed for migraine. May repeat in 2 hours if  headache persists or recurs., Disp: 12 tablet, Rfl: 6   topiramate (TOPAMAX) 100 MG tablet, Take 1 tablet (100 mg total) by mouth 2 (two) times daily., Disp: 60 tablet, Rfl: 11   Review of Systems:   ROS Negative unless otherwise specified per HPI. Vitals:   Vitals:   01/14/22 0843  BP: 110/70  Pulse: 100  Temp: 98.5 F (36.9 C)  TempSrc: Temporal  SpO2: 98%  Weight: 249 lb 6.1 oz (113.1 kg)  Height: 5\' 4"  (1.626 m)     Body mass index is 42.81 kg/m.  Physical Exam:   Physical Exam HENT:     Left Ear: Tympanic membrane is erythematous.  Musculoskeletal:     Comments: Ecchymosis to distal aspect of LEFT 3rd phalanx TTP to 3rd phalanx with decreased ROM 2/2 pain Ecchymosis to base of 2nd and 3rd phalanx of L foot  Feet:     Right foot:     Skin integrity: Skin integrity normal.     Toenail Condition: Right toenails are normal.     Left foot:     Skin integrity: No skin breakdown or erythema.     Toenail Condition: Left toenails are normal.   Results for orders placed or performed in visit on 01/14/22  POCT rapid strep A  Result Value Ref Range   Rapid Strep A Screen Positive (A) Negative     Assessment and Plan:   Left ear pain Positive for Strept A  Start Amoxicillin 500 mg twice daily x 10 days May use ibuprofen/tylenol for additional relief as needed Encouraged patient to push more fluids and rest  Follow up if new/worsening symptoms or concerns occur  Pain of toe of left foot Suspect possible phalanx fx Placed order for x-ray of left toe today  Buddy tape completed Post- op shoe provided to keep foot from bending May use ibuprofen/tylenol for pain relief as needed Follow up as needed  I,[name],acting as a scribe for June, PA.,have documented all relevant documentation on the behalf of [employer], PA,as directed by  Jarold Motto, PA while in the presence of Jarold Motto, Jarold Motto.  I, [place], Jarold Motto, have reviewed all  documentation for this visit. The documentation on 01/14/22 for the exam, diagnosis, procedures, and orders are all accurate and complete.   03/14/22, PA-C

## 2022-01-16 ENCOUNTER — Ambulatory Visit: Payer: BC Managed Care – PPO | Admitting: Physician Assistant

## 2022-02-27 ENCOUNTER — Other Ambulatory Visit: Payer: Self-pay | Admitting: Physician Assistant

## 2022-05-25 ENCOUNTER — Encounter: Payer: Self-pay | Admitting: Physician Assistant

## 2022-06-17 ENCOUNTER — Ambulatory Visit: Payer: BC Managed Care – PPO | Admitting: Neurology

## 2022-06-17 ENCOUNTER — Encounter: Payer: Self-pay | Admitting: Neurology

## 2022-06-17 VITALS — BP 119/75 | HR 82 | Ht 64.0 in | Wt 247.1 lb

## 2022-06-17 DIAGNOSIS — E669 Obesity, unspecified: Secondary | ICD-10-CM

## 2022-06-17 DIAGNOSIS — G932 Benign intracranial hypertension: Secondary | ICD-10-CM | POA: Diagnosis not present

## 2022-06-17 MED ORDER — TOPIRAMATE 100 MG PO TABS: 100.0000 mg | ORAL_TABLET | Freq: Two times a day (BID) | ORAL | 3 refills | Status: DC

## 2022-06-17 MED ORDER — SUMATRIPTAN SUCCINATE 50 MG PO TABS: 50.0000 mg | ORAL_TABLET | ORAL | 6 refills | Status: DC | PRN

## 2022-06-17 NOTE — Progress Notes (Addendum)
PATIENT: [name] DOB: 1986-08-04  REASON FOR VISIT: Follow up for IIH HISTORY FROM: Patient PRIMARY NEUROLOGIST: Dr. Terrace Arabia  HISTORY  [name], is a 36 year old female, seen in request by ophthalmologist Dr. Dione Booze for evaluation of bilateral papillary edema, her primary care PA Alma Downs, [place], initial evaluation was on September 26, 2021   I reviewed and summarized the referring note. PMHx. Inflammatory arthritis, under the care of [facility] Dr. Aryl, taking Plaquenil Depression, anxiety.   Patient was diagnosed with inflammatory arthritis, taking Plaquenil, over the past few years, she was seen by Dr. Alden Hipp on a yearly basis, there was no significant abnormality find in the past, but at his most recent evaluation on July 14, 2021, posterior examination showed nasal disc edema bilaterally, mild blurring of margin, no disc pallor, CDR 0.1 left, 0.15 right eye. 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   She did report rapid weight gain of 50 pounds over the past 2 years  She had long history of intermittent headache, used to only have couple times each month, since 2022, she has more frequent headaches, at least couple times each week, taking frequent ibuprofen with suboptimal response  She works at a desk job, complains of intermittent blurry vision, especially with sudden positional change, such as bending over,  Update December 09, 2021 SS: MRI of the brain without contrast in October 2022 showed no significant abnormality, there was evidence of 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  LP on 10/09/21 showed opening pressure of 40 cm water consistent with diagnosis of IIH.  Doing well on Topamax 200 mg at bedtime. Headaches are improved, less frequent, are 2-3 a month, less intense. Takes Imitrex, with good benefit, does  make her nauseated. Saw Dr. Dione Booze in November, swelling was improved. Will follow up in 1 year with him. Has lost 15 lbs since October. Has been doing walking for exercise. Works 2 jobs, at [employer] and [employer], has 2 daughters (8 & 10).  Update 06/17/22 SS:  taking Topamax 200 mg at bedtime. Some family stress, no further weight loss. On the end of Ozempic. Some headaches with sinus/ear infections. Takes Imitrex for migraines, only 1 a week, with fast relief. Sees Dr. Dione Booze in November.   REVIEW OF SYSTEMS: Out of a complete 14 system review of symptoms, the patient complains only of the following symptoms, and all other reviewed systems are negative.  See HPI  ALLERGIES: Allergies  Allergen Reactions   Cephalosporins Nausea And Vomiting and Rash   Ondansetron Nausea And Vomiting and Other (See Comments)    Chest pain and nose bleeds    Zofran Nausea And Vomiting and Other (See Comments)    Chest pain and nose bleeds   Sulfamethoxazole-Trimethoprim     HOME MEDICATIONS: Outpatient Medications Prior to Visit  Medication Sig Dispense Refill   azelastine (ASTELIN) 0.1 % nasal spray SPRAY 1 SPRAY INTO EACH NOSTRIL TWICE A DAY     buPROPion (WELLBUTRIN SR) 150 MG 12 hr tablet TAKE 1 TABLET BY MOUTH TWICE A DAY 180 tablet 0   citalopram (CELEXA) 40 MG tablet TAKE 1 TABLET BY MOUTH EVERY DAY 90 tablet 0   EPINEPHrine 0.3 mg/0.3 mL IJ SOAJ injection SMARTSIG:0.3 Milliliter(s) IM Once PRN     fluticasone (FLONASE) 50 MCG/ACT nasal spray USE 1 2 SPRAYS IN EACH NOSTRIL ONCE A DAY     hydroxychloroquine (  PLAQUENIL) 200 MG tablet 2 TABLETS WITH FOOD OR MILK ONCE A DAY  2   ibuprofen (ADVIL) 200 MG tablet Take 600 mg by mouth every 6 (six) hours as needed.     levocetirizine (XYZAL) 5 MG tablet Take 5 mg by mouth daily.      levonorgestrel (MIRENA) 20 MCG/24HR IUD 1 each by Intrauterine route once. Inserted 01/19/2018, needs to be removed 01/2023.     montelukast (SINGULAIR) 10 MG tablet Take 10 mg  by mouth daily.  1   OZEMPIC, 0.25 OR 0.5 MG/DOSE, 2 MG/1.5ML SOPN INJECT 0.25MG  INTO THE SKIN ONE TIME PER WEEK 1.5 mL 1   SUMAtriptan (IMITREX) 50 MG tablet Take 1 tablet (50 mg total) by mouth every 2 (two) hours as needed for migraine. May repeat in 2 hours if headache persists or recurs. 12 tablet 6   topiramate (TOPAMAX) 100 MG tablet Take 1 tablet (100 mg total) by mouth 2 (two) times daily. 60 tablet 11   amoxicillin (AMOXIL) 500 MG tablet Take 1 tablet (500 mg total) by mouth 2 (two) times daily. (Patient not taking: Reported on 06/17/2022) 20 tablet 0   famotidine (PEPCID) 40 MG tablet Take 40 mg by mouth 2 (two) times daily. (Patient not taking: Reported on 06/17/2022)     No facility-administered medications prior to visit.    PAST MEDICAL HISTORY: Past Medical History:  Diagnosis Date   Acid reflux    Allergy    Anxiety    Depression    Hyperlipidemia    Inflammatory arthritis 02/18/2018    PAST SURGICAL HISTORY: Past Surgical History:  Procedure Laterality Date   PERIPHERALLY INSERTED CENTRAL CATHETER INSERTION     WISDOM TOOTH EXTRACTION     WISDOM TOOTH EXTRACTION Bilateral     FAMILY HISTORY: Family History  Problem Relation Age of Onset   Hypertension Mother    Hyperlipidemia Mother    Depression Sister    Heart disease Maternal Grandmother    Heart disease Maternal Grandfather    Heart disease Paternal Grandmother    Heart disease Paternal Grandfather    Diabetes Paternal Aunt    Diabetes Paternal Uncle    Breast cancer Neg Hx    Colon cancer Neg Hx     SOCIAL HISTORY: Social History   Socioeconomic History   Marital status: Married    Spouse name: Not on file   Number of children: Not on file   Years of education: Not on file   Highest education level: Not on file  Occupational History   Not on file  Tobacco Use   Smoking status: Never   Smokeless tobacco: Never  Vaping Use   Vaping Use: Never used  Substance and Sexual Activity   Alcohol  use: Yes    Comment: very rare   Drug use: No   Sexual activity: Yes    Birth control/protection: None  Other Topics Concern   Not on file  Social History Narrative   Right handed   Coffee- 2 cups per week   Married with 2 kids -- daughters   Works at [employer] and [employer] (2nd shift)   Live in [place]   Social Determinants of Health   Financial Resource Strain: Not on file  Food Insecurity: Not on file  Transportation Needs: Not on file  Physical Activity: Not on file  Stress: Not on file  Social Connections: Not on file  Intimate Partner Violence: Not on file   PHYSICAL EXAM  Vitals:  06/17/22 1023  BP: 119/75  Pulse: 82  Weight: 247 lb 2 oz (112.1 kg)  Height: 5\' 4"  (1.626 m)    Body mass index is 42.42 kg/m.  Generalized: Well developed, in no acute distress   Neurological examination  Mentation: Alert oriented to time, place, history taking. Follows all commands speech and language fluent Cranial nerve II-XII: Pupils were equal round reactive to light. Extraocular movements were full, visual field were full on confrontational test. Facial sensation and strength were normal. Head turning and shoulder shrug  were normal and symmetric. Motor: The motor testing reveals 5 over 5 strength of all 4 extremities. Good symmetric motor tone is noted throughout.  Sensory: Sensory testing is intact to soft touch on all 4 extremities. No evidence of extinction is noted.  Coordination: Cerebellar testing reveals good finger-nose-finger and heel-to-shin bilaterally.  Gait and station: Gait is normal. Reflexes: Deep tendon reflexes are symmetric and normal bilaterally.   DIAGNOSTIC DATA (LABS, IMAGING, TESTING) - I reviewed patient records, labs, notes, testing and imaging myself where available.  Lab Results  Component Value Date   WBC 6.5 11/14/2021   HGB 13.9 11/14/2021   HCT 41 11/14/2021   MCV 99.4 08/26/2021   PLT 359.0 08/26/2021      Component Value Date/Time    NA 140 11/14/2021 0000   K 4.0 11/14/2021 0000   CL 105 11/14/2021 0000   CO2 23 (A) 11/14/2021 0000   GLUCOSE 63 (L) 08/26/2021 1626   BUN 7 11/14/2021 0000   CREATININE 0.8 11/14/2021 0000   CREATININE 0.71 08/26/2021 1626   CALCIUM 9.0 11/14/2021 0000   PROT 7.8 08/26/2021 1626   ALBUMIN 4.1 11/14/2021 0000   ALBUMIN 5.0 10/09/2021 0901   AST 16 11/14/2021 0000   ALT 22 11/14/2021 0000   ALKPHOS 65 11/14/2021 0000   BILITOT 0.6 08/26/2021 1626   GFRNONAA 75.89 01/23/2020 0000   GFRAA 91.83 01/23/2020 0000   Lab Results  Component Value Date   CHOL 235 (H) 08/26/2021   HDL 54.40 08/26/2021   LDLCALC 164 (H) 08/26/2021   TRIG 84.0 08/26/2021   CHOLHDL 4 08/26/2021   Lab Results  Component Value Date   HGBA1C 5.3 08/26/2021   No results found for: "VITAMINB12" Lab Results  Component Value Date   TSH 2.08 08/26/2021   ASSESSMENT AND PLAN 36 y.o. year old female :  1.  Intracranial hypertension 2.  Bilateral papilledema 3.  Obesity 4.  Frequent headaches with migraine features  -Doing well, continue Topamax 200 mg daily, continue Imitrex as needed for acute headache -Encouraged to get back on track with weight loss, discussed weight loss is key for long-term management of IIH -Follow-up with Dr. 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 -LP on 10/09/21 showed opening pressure of 40 cm water consistent with diagnosis of IIH -Follow-up in 6 months or sooner if needed, call for any worsening symptoms  Addendum 11/12/2022 SS: Seen at [facility] on 11/09/22 by 11/11/22, PA.  The fundus exam revealed no abnormalities in the macula.  Nasal disc edema decreased bilaterally.  Clear to continue Plaquenil.  Decreasing edema on eye exam.  Follow-up in 6  months.  Alma Downs, AGNP-C, DNP 06/17/2022, 10:54 AM [facility] [address], [address] [place], [place] [phone]  [phone]  

## 2022-06-17 NOTE — Patient Instructions (Signed)
Continue current medications  Meds ordered this encounter  Medications   topiramate (TOPAMAX) 100 MG tablet    Sig: Take 1 tablet (100 mg total) by mouth 2 (two) times daily.    Dispense:  180 tablet    Refill:  3   SUMAtriptan (IMITREX) 50 MG tablet    Sig: Take 1 tablet (50 mg total) by mouth every 2 (two) hours as needed for migraine. May repeat in 2 hours if headache persists or recurs.    Dispense:  12 tablet    Refill:  6   See Dr. Dione Booze end of the year, call for any worsening symptoms Work on weight loss

## 2022-08-14 ENCOUNTER — Encounter: Payer: Self-pay | Admitting: Physician Assistant

## 2022-08-14 ENCOUNTER — Ambulatory Visit: Payer: BC Managed Care – PPO | Admitting: Physician Assistant

## 2022-08-14 VITALS — BP 108/80 | HR 90 | Temp 98.1°F | Ht 64.0 in | Wt 242.0 lb

## 2022-08-14 DIAGNOSIS — F330 Major depressive disorder, recurrent, mild: Secondary | ICD-10-CM | POA: Diagnosis not present

## 2022-08-14 DIAGNOSIS — J029 Acute pharyngitis, unspecified: Secondary | ICD-10-CM | POA: Diagnosis not present

## 2022-08-14 DIAGNOSIS — F419 Anxiety disorder, unspecified: Secondary | ICD-10-CM

## 2022-08-14 LAB — POC COVID19 BINAXNOW: SARS Coronavirus 2 Ag: NEGATIVE

## 2022-08-14 LAB — POC INFLUENZA A&B (BINAX/QUICKVUE)
Influenza A, POC: NEGATIVE
Influenza B, POC: NEGATIVE

## 2022-08-14 LAB — POCT RAPID STREP A (OFFICE): Rapid Strep A Screen: NEGATIVE

## 2022-08-14 MED ORDER — NIRMATRELVIR/RITONAVIR (PAXLOVID)TABLET: 3.0000 | ORAL_TABLET | Freq: Two times a day (BID) | ORAL | 0 refills | Status: AC

## 2022-08-14 NOTE — Progress Notes (Signed)
[name] is a 36 y.o. female here for a new problem.  History of Present Illness:   Chief Complaint  Patient presents with   Sore Throat    Pt c/o sore throat, ear pressure and sinus pressure, started on Monday. Daughter was dx with COVID yesterday.    HPI  Sore throat Symptoms started on Monday. She has had body aches, sore throat, ear and sinus pressure. Taking ibuprofen and using her nasal sprays. Has had some possible chills. Denies fever, wheezing, n/v/d. She works at [employer] and has been exposed to a lot of covid and flu this week. Her daughter tested positive for COVID yesterday.  Anxiety and Depression Currently not on wellbutrin or celexa. She stopped these about a year ago. She realized that they were making her stomach hurt. She also has stopped communications with her mom which was a big source of her anxiety/depression. Feels well controlled. Denies SI/HI.  Past Medical History:  Diagnosis Date   Acid reflux    Allergy    Anxiety    Depression    Hyperlipidemia    Inflammatory arthritis 02/18/2018     Social History   Tobacco Use   Smoking status: Never   Smokeless tobacco: Never  Vaping Use   Vaping Use: Never used  Substance Use Topics   Alcohol use: Yes    Comment: very rare   Drug use: No    Past Surgical History:  Procedure Laterality Date   PERIPHERALLY INSERTED CENTRAL CATHETER INSERTION     WISDOM TOOTH EXTRACTION     WISDOM TOOTH EXTRACTION Bilateral     Family History  Problem Relation Age of Onset   Hypertension Mother    Hyperlipidemia Mother    Depression Sister    Heart disease Maternal Grandmother    Heart disease Maternal Grandfather    Heart disease Paternal Grandmother    Heart disease Paternal Grandfather    Diabetes Paternal Aunt    Diabetes Paternal Uncle    Breast cancer Neg Hx    Colon cancer Neg Hx     Allergies  Allergen Reactions   Cephalosporins Nausea And Vomiting and Rash   Ondansetron Nausea  And Vomiting and Other (See Comments)    Chest pain and nose bleeds    Zofran Nausea And Vomiting and Other (See Comments)    Chest pain and nose bleeds   Sulfamethoxazole-Trimethoprim     Current Medications:   Current Outpatient Medications:    azelastine (ASTELIN) 0.1 % nasal spray, SPRAY 1 SPRAY INTO EACH NOSTRIL TWICE A DAY, Disp: , Rfl:    EPINEPHrine 0.3 mg/0.3 mL IJ SOAJ injection, SMARTSIG:0.3 Milliliter(s) IM Once PRN, Disp: , Rfl:    famotidine (PEPCID) 40 MG tablet, Take 40 mg by mouth 2 (two) times daily., Disp: , Rfl:    fluticasone (FLONASE) 50 MCG/ACT nasal spray, USE 1 2 SPRAYS IN EACH NOSTRIL ONCE A DAY, Disp: , Rfl:    hydroxychloroquine (PLAQUENIL) 200 MG tablet, 2 TABLETS WITH FOOD OR MILK ONCE A DAY, Disp: , Rfl: 2   ibuprofen (ADVIL) 200 MG tablet, Take 600 mg by mouth every 6 (six) hours as needed., Disp: , Rfl:    levocetirizine (XYZAL) 5 MG tablet, Take 5 mg by mouth daily. , Disp: , Rfl:    levonorgestrel (MIRENA) 20 MCG/24HR IUD, 1 each by Intrauterine route once. Inserted 01/19/2018, needs to be removed 01/2023., Disp: , Rfl:    montelukast (SINGULAIR) 10 MG tablet, Take 10 mg by  mouth daily., Disp: , Rfl: 1   SUMAtriptan (IMITREX) 50 MG tablet, Take 1 tablet (50 mg total) by mouth every 2 (two) hours as needed for migraine. May repeat in 2 hours if headache persists or recurs., Disp: 12 tablet, Rfl: 6   topiramate (TOPAMAX) 100 MG tablet, Take 1 tablet (100 mg total) by mouth 2 (two) times daily., Disp: 180 tablet, Rfl: 3   Review of Systems:   ROS Negative unless otherwise specified per HPI.   Vitals:   Vitals:   08/14/22 1311  BP: 108/80  Pulse: 90  Temp: 98.1 F (36.7 C)  TempSrc: Temporal  SpO2: 97%  Weight: 242 lb (109.8 kg)  Height: 5\' 4"  (1.626 m)     Body mass index is 41.54 kg/m.  Physical Exam:   Physical Exam Vitals and nursing note reviewed.  Constitutional:      General: She is not in acute distress.    Appearance: She is  well-developed. She is not ill-appearing or toxic-appearing.  HENT:     Head: Normocephalic and atraumatic.     Right Ear: Tympanic membrane, ear canal and external ear normal. Tympanic membrane is not erythematous, retracted or bulging.     Left Ear: Tympanic membrane, ear canal and external ear normal. Tympanic membrane is not erythematous, retracted or bulging.     Nose: Nose normal.     Right Sinus: No maxillary sinus tenderness or frontal sinus tenderness.     Left Sinus: No maxillary sinus tenderness or frontal sinus tenderness.     Mouth/Throat:     Pharynx: Uvula midline. No posterior oropharyngeal erythema.  Eyes:     General: Lids are normal.     Conjunctiva/sclera: Conjunctivae normal.  Neck:     Trachea: Trachea normal.  Cardiovascular:     Rate and Rhythm: Normal rate and regular rhythm.     Heart sounds: Normal heart sounds, S1 normal and S2 normal.  Pulmonary:     Effort: Pulmonary effort is normal.     Breath sounds: Normal breath sounds. No decreased breath sounds, wheezing, rhonchi or rales.  Lymphadenopathy:     Cervical: No cervical adenopathy.  Skin:    General: Skin is warm and dry.  Neurological:     Mental Status: She is alert.  Psychiatric:        Speech: Speech normal.        Behavior: Behavior normal. Behavior is cooperative.    Results for orders placed or performed in visit on 08/14/22  POCT rapid strep A  Result Value Ref Range   Rapid Strep A Screen Negative Negative  POC COVID-19  Result Value Ref Range   SARS Coronavirus 2 Ag Negative Negative  POC Influenza A&B(BINAX/QUICKVUE)  Result Value Ref Range   Influenza A, POC Negative Negative   Influenza B, POC Negative Negative     Assessment and Plan:   Sore throat No red flags on exam All POC testing negative in office, however I suspect COVID given her daughter has this Will treat with Paxlovid -- discussed risks/benefits/side effects If new/worsening symptoms, recommend follow-up in  office  Anxiety; Mild episode of recurrent major depressive disorder (HCC) Overall stable at this time Denies need for intervention Continue to monitor and follow-up as scheduled for regular healthcare  10/14/22, PA-C

## 2022-08-14 NOTE — Patient Instructions (Signed)
It was great to see you!  If flu test is positive, I will send in tamiflu  If flu test is negative, I will send in paxlovid assuming that you likely have COVID and its too early to test positive  Keep me posted if symptoms do not improve  Take care,  Jarold Motto PA-C

## 2022-09-08 ENCOUNTER — Ambulatory Visit: Payer: BC Managed Care – PPO | Admitting: Family Medicine

## 2022-09-08 ENCOUNTER — Encounter: Payer: Self-pay | Admitting: Family Medicine

## 2022-09-08 VITALS — BP 130/82 | HR 102 | Temp 98.1°F | Ht 64.0 in | Wt 239.4 lb

## 2022-09-08 DIAGNOSIS — J019 Acute sinusitis, unspecified: Secondary | ICD-10-CM

## 2022-09-08 DIAGNOSIS — B9689 Other specified bacterial agents as the cause of diseases classified elsewhere: Secondary | ICD-10-CM | POA: Diagnosis not present

## 2022-09-08 MED ORDER — BENZONATATE 100 MG PO CAPS: 100.0000 mg | ORAL_CAPSULE | Freq: Three times a day (TID) | ORAL | 0 refills | Status: DC | PRN

## 2022-09-08 MED ORDER — AMOXICILLIN-POT CLAVULANATE 875-125 MG PO TABS: 1.0000 | ORAL_TABLET | Freq: Two times a day (BID) | ORAL | 0 refills | Status: DC

## 2022-09-08 NOTE — Patient Instructions (Signed)
Sent augmentin and tessalon perles.   Worse, let us know

## 2022-09-08 NOTE — Progress Notes (Signed)
Subjective:     Patient ID: [name], female    DOB: [birth year], 36 y.o.   MRN: [id number]  Chief Complaint  Patient presents with   Cough    Productive cough that started last week   Ear Pain    Bilateral ear pain, dx last [facility]    Nasal Congestion   Fatigue    HPI  Cough-productive for 1 wk.  B ear pain-[facility]-last wk and 3 tabs of zithromax.  Got better some.  Now worse again 2 days after meds.  Getting hoarse, congested.  Doing otc.  + chills.  No fever.  No SOB.  +nausea from drainage.  No diarrhea.    Neg covid last pm..  + face pain    Health Maintenance Due  Topic Date Due   Hepatitis C Screening  Never done   PAP SMEAR-Modifier  07/29/2022    Past Medical History:  Diagnosis Date   Acid reflux    Allergy    Anxiety    Depression    Hyperlipidemia    Inflammatory arthritis 02/18/2018    Past Surgical History:  Procedure Laterality Date   PERIPHERALLY INSERTED CENTRAL CATHETER INSERTION     WISDOM TOOTH EXTRACTION     WISDOM TOOTH EXTRACTION Bilateral     Outpatient Medications Prior to Visit  Medication Sig Dispense Refill   azelastine (ASTELIN) 0.1 % nasal spray SPRAY 1 SPRAY INTO EACH NOSTRIL TWICE A DAY     EPINEPHrine 0.3 mg/0.3 mL IJ SOAJ injection SMARTSIG:0.3 Milliliter(s) IM Once PRN     famotidine (PEPCID) 40 MG tablet Take 40 mg by mouth 2 (two) times daily.     fluticasone (FLONASE) 50 MCG/ACT nasal spray USE 1 2 SPRAYS IN EACH NOSTRIL ONCE A DAY     hydroxychloroquine (PLAQUENIL) 200 MG tablet 2 TABLETS WITH FOOD OR MILK ONCE A DAY  2   ibuprofen (ADVIL) 200 MG tablet Take 600 mg by mouth every 6 (six) hours as needed.     levocetirizine (XYZAL) 5 MG tablet Take 5 mg by mouth daily.      levonorgestrel (MIRENA) 20 MCG/24HR IUD 1 each by Intrauterine route once. Inserted 01/19/2018, needs to be removed 01/2023.     montelukast (SINGULAIR) 10 MG tablet Take 10 mg by mouth daily.   1   SUMAtriptan (IMITREX) 50 MG tablet Take 1 tablet (50 mg total) by mouth every 2 (two) hours as needed for migraine. May repeat in 2 hours if headache persists or recurs. 12 tablet 6   topiramate (TOPAMAX) 100 MG tablet Take 1 tablet (100 mg total) by mouth 2 (two) times daily. 180 tablet 3   No facility-administered medications prior to visit.    Allergies  Allergen Reactions   Cephalosporins Nausea And Vomiting and Rash   Ondansetron Nausea And Vomiting and Other (See Comments)    Chest pain and nose bleeds    Zofran Nausea And Vomiting and Other (See Comments)    Chest pain and nose bleeds   Sulfamethoxazole-Trimethoprim    ROS neg/noncontributory except as noted HPI/below      Objective:     BP 130/82   Pulse (!) 102   Temp 98.1 F (36.7 C) (Temporal)   Ht 5\' 4"  (1.626 m)   Wt 239 lb 6 oz (108.6 kg)   SpO2 96%   BMI 41.09 kg/m  Wt Readings from Last 3 Encounters:  09/08/22 239 lb 6  oz (108.6 kg)  08/14/22 242 lb (109.8 kg)  06/17/22 247 lb 2 oz (112.1 kg)    Physical Exam   Gen: WDWN NAD HEENT: NCAT, conjunctiva not injected, sclera nonicteric TM WNL B, OP moist, no exudates congested.  +sinus tenderness to percussion NECK:  supple, no thyromegaly, no nodes, no carotid bruits CARDIAC: RRR, S1S2+, no murmur.  LUNGS: CTAB. No wheezes EXT:  no edema MSK: no gross abnormalities.  NEURO: A&O x3.  CN II-XII intact.  PSYCH: normal mood. Good eye contact     Assessment & Plan:   Problem List Items Addressed This Visit   None Visit Diagnoses     Acute bacterial sinusitis    -  Primary   Relevant Medications   amoxicillin-clavulanate (AUGMENTIN) 875-125 MG tablet   benzonatate (TESSALON PERLES) 100 MG capsule      Sinusitis-augmentin 875 bid and tessalon perles 100mg  tid prn.    Meds ordered this encounter  Medications   amoxicillin-clavulanate (AUGMENTIN) 875-125 MG tablet    Sig: Take 1 tablet by mouth 2 (two) times daily.    Dispense:  20 tablet     Refill:  0   benzonatate (TESSALON PERLES) 100 MG capsule    Sig: Take 1 capsule (100 mg total) by mouth 3 (three) times daily as needed.    Dispense:  20 capsule    Refill:  0    , MD

## 2022-09-09 ENCOUNTER — Ambulatory Visit: Payer: BC Managed Care – PPO | Admitting: Physician Assistant

## 2022-11-09 LAB — RESULTS CONSOLE HPV: CHL HPV: NEGATIVE

## 2022-11-09 LAB — HM DIABETES EYE EXAM

## 2022-11-09 LAB — HM PAP SMEAR

## 2022-11-13 LAB — BASIC METABOLIC PANEL
BUN: 12 (ref 4–21)
CO2: 23 — AB (ref 13–22)
Chloride: 106 (ref 99–108)
Creatinine: 0.9 (ref 0.5–1.1)
Glucose: 80
Potassium: 4.7 mEq/L (ref 3.5–5.1)
Sodium: 137 (ref 137–147)

## 2022-11-13 LAB — COMPREHENSIVE METABOLIC PANEL
Albumin: 4 (ref 3.5–5.0)
Calcium: 9.3 (ref 8.7–10.7)

## 2022-11-13 LAB — HEPATIC FUNCTION PANEL
ALT: 29 U/L (ref 7–35)
AST: 10 — AB (ref 13–35)
Alkaline Phosphatase: 67 (ref 25–125)
Bilirubin, Total: 0.5

## 2022-11-13 LAB — CBC AND DIFFERENTIAL
HCT: 42 (ref 36–46)
Hemoglobin: 13.9 (ref 12.0–16.0)
Neutrophils Absolute: 1.9

## 2022-12-09 ENCOUNTER — Ambulatory Visit (INDEPENDENT_AMBULATORY_CARE_PROVIDER_SITE_OTHER): Payer: BC Managed Care – PPO | Admitting: Physician Assistant

## 2022-12-09 ENCOUNTER — Encounter: Payer: Self-pay | Admitting: Physician Assistant

## 2022-12-09 VITALS — BP 109/65 | HR 84 | Temp 97.7°F | Ht 64.0 in | Wt 225.0 lb

## 2022-12-09 DIAGNOSIS — E669 Obesity, unspecified: Secondary | ICD-10-CM

## 2022-12-09 DIAGNOSIS — M1990 Unspecified osteoarthritis, unspecified site: Secondary | ICD-10-CM

## 2022-12-09 DIAGNOSIS — Z0000 Encounter for general adult medical examination without abnormal findings: Secondary | ICD-10-CM

## 2022-12-09 DIAGNOSIS — Z1159 Encounter for screening for other viral diseases: Secondary | ICD-10-CM | POA: Diagnosis not present

## 2022-12-09 NOTE — Progress Notes (Signed)
Subjective:    [name] is a 36 y.o. female and is here for a comprehensive physical exam.  HPI  Health Maintenance Due  Topic Date Due   Hepatitis C Screening  Never done   PAP SMEAR-Modifier  07/29/2022   COVID-19 Vaccine (5 - 2023-24 season) 08/14/2022   Acute Concerns: None  Chronic Issues: Inflammatory arthritis --patient continues to see rheumatology for this.  She just saw them within the past 2 months.  She had blood work.  This was reviewed today.  She continues to take Plaquenil as prescribed and get regular eye exams.  She feels as though her symptoms are well-controlled.  Health Maintenance: Immunizations --up-to-date Colonoscopy -- n/a Mammogram -- n/a PAP -- UTD --we do not have records but she states her last Pap was within normal limits and was earlier this year Bone Density -- n/a Diet -- trying to eat better Exercise -- working on more exercise  Sleep habits -- sleeps well with topamax and singulair Mood -- overall stable  UTD with dentist? - UTD UTD with eye doctor? - UTD  Weight history: Wt Readings from Last 10 Encounters:  12/09/22 225 lb (102.1 kg)  09/08/22 239 lb 6 oz (108.6 kg)  08/14/22 242 lb (109.8 kg)  06/17/22 247 lb 2 oz (112.1 kg)  01/14/22 249 lb 6.1 oz (113.1 kg)  12/09/21 248 lb 8 oz (112.7 kg)  09/26/21 263 lb 8 oz (119.5 kg)  08/26/21 271 lb 6.1 oz (123.1 kg)  03/05/21 260 lb (117.9 kg)  01/03/21 255 lb (115.7 kg)   Body mass index is 38.62 kg/m. No LMP recorded. (Menstrual status: IUD).  Alcohol use:  reports current alcohol use.  Tobacco use:  Tobacco Use: Low Risk  (12/09/2022)   Patient History    Smoking Tobacco Use: Never    Smokeless Tobacco Use: Never    Passive Exposure: Not on file   Eligible for lung cancer screening?  No     08/14/2022    1:45 PM  Depression screen PHQ 2/9  Decreased Interest 0  Down, Depressed, Hopeless 1  PHQ - 2 Score 1  Altered sleeping 1  Tired, decreased energy 1   Change in appetite 1  Feeling bad or failure about yourself  1  Trouble concentrating 0  Moving slowly or fidgety/restless 0  Suicidal thoughts 0  PHQ-9 Score 5     Other providers/specialists: Patient Care Team: Inda Coke, [place] as PCP - General (Physician Assistant) Cheri Fowler, MD as Consulting Physician (Obstetrics and Gynecology) Lahoma Rocker, MD as Consulting Physician (Rheumatology)    PMHx, SurgHx, SocialHx, Medications, and Allergies were reviewed in the Visit Navigator and updated as appropriate.   Past Medical History:  Diagnosis Date   Acid reflux    Allergy    Anxiety    Depression    Hyperlipidemia    Inflammatory arthritis 02/18/2018     Past Surgical History:  Procedure Laterality Date   PERIPHERALLY INSERTED CENTRAL CATHETER INSERTION     WISDOM TOOTH EXTRACTION     WISDOM TOOTH EXTRACTION Bilateral      Family History  Problem Relation Age of Onset   Hypertension Mother    Hyperlipidemia Mother    Depression Sister    Heart disease Maternal Grandmother    Heart disease Maternal Grandfather    Heart disease Paternal Grandmother    Heart disease Paternal Grandfather    Diabetes Paternal Aunt    Diabetes Paternal Uncle    Breast cancer  Neg Hx    Colon cancer Neg Hx     Social History   Tobacco Use   Smoking status: Never   Smokeless tobacco: Never  Vaping Use   Vaping Use: Never used  Substance Use Topics   Alcohol use: Yes    Comment: very rare   Drug use: No    Review of Systems:   Review of Systems  Constitutional:  Negative for chills, fever, malaise/fatigue and weight loss.  HENT:  Negative for hearing loss, sinus pain and sore throat.   Respiratory:  Negative for cough and hemoptysis.   Cardiovascular:  Negative for chest pain, palpitations, leg swelling and PND.  Gastrointestinal:  Negative for abdominal pain, constipation, diarrhea, heartburn, nausea and vomiting.  Genitourinary:  Negative for dysuria, frequency  and urgency.  Musculoskeletal:  Negative for back pain, myalgias and neck pain.  Skin:  Negative for itching and rash.  Neurological:  Negative for dizziness, tingling, seizures and headaches.  Endo/Heme/Allergies:  Negative for polydipsia.  Psychiatric/Behavioral:  Negative for depression. The patient is not nervous/anxious.     Objective:   BP 109/65 (BP Location: Right Arm, Patient Position: Sitting)   Pulse 84   Temp 97.7 F (36.5 C) (Temporal)   Ht 5\' 4"  (1.626 m)   Wt 225 lb (102.1 kg)   SpO2 98%   BMI 38.62 kg/m  Body mass index is 38.62 kg/m.   General Appearance:    Alert, cooperative, no distress, appears stated age  Head:    Normocephalic, without obvious abnormality, atraumatic  Eyes:    PERRL, conjunctiva/corneas clear, EOM's intact, fundi    benign, both eyes  Ears:    Normal TM's and external ear canals, both ears  Nose:   Nares normal, septum midline, mucosa normal, no drainage    or sinus tenderness  Throat:   Lips, mucosa, and tongue normal; teeth and gums normal  Neck:   Supple, symmetrical, trachea midline, no adenopathy;    thyroid:  no enlargement/tenderness/nodules; no carotid   bruit or JVD  Back:     Symmetric, no curvature, ROM normal, no CVA tenderness  Lungs:     Clear to auscultation bilaterally, respirations unlabored  Chest Wall:    No tenderness or deformity   Heart:    Regular rate and rhythm, S1 and S2 normal, no murmur, rub or gallop  Breast Exam:    Deferred  Abdomen:     Soft, non-tender, bowel sounds active all four quadrants,    no masses, no organomegaly  Genitalia:    Deferred   Extremities:   Extremities normal, atraumatic, no cyanosis or edema  Pulses:   2+ and symmetric all extremities  Skin:   Skin color, texture, turgor normal, no rashes or lesions  Lymph nodes:   Cervical, supraclavicular, and axillary nodes normal  Neurologic:   CNII-XII intact, normal strength, sensation and reflexes    throughout    Assessment/Plan:    Routine physical examination Today patient counseled on age appropriate routine health concerns for screening and prevention, each reviewed and up to date or declined. Immunizations reviewed and up to date or declined. Labs ordered and reviewed. Risk factors for depression reviewed and negative. Hearing function and visual acuity are intact. ADLs screened and addressed as needed. Functional ability and level of safety reviewed and appropriate. Education, counseling and referrals performed based on assessed risks today. Patient provided with a copy of personalized plan for preventive services.  Obesity, unspecified classification, unspecified obesity type, unspecified  whether serious comorbidity present Continue efforts at exercise and healthy eating  Encounter for screening for other viral diseases Update hep C  Inflammatory arthritis Overall controlled per patient  Jarold Motto, PA-C [facility]

## 2022-12-09 NOTE — Patient Instructions (Signed)
It was great to see you! ? ?Please go to the lab for blood work.  ? ?Our office will call you with your results unless you have chosen to receive results via MyChart. ? ?If your blood work is normal we will follow-up each year for physicals and as scheduled for chronic medical problems. ? ?If anything is abnormal we will treat accordingly and get you in for a follow-up. ? ?Take care, ? ?Gerilynn Mccullars ?  ? ? ?

## 2022-12-10 ENCOUNTER — Encounter: Payer: Self-pay | Admitting: Physician Assistant

## 2022-12-10 LAB — LIPID PANEL
Cholesterol: 210 mg/dL — ABNORMAL HIGH (ref 0–200)
HDL: 54.9 mg/dL (ref >39.00)
LDL Cholesterol: 142 mg/dL — ABNORMAL HIGH (ref 0–99)
NonHDL: 155.02
Total CHOL/HDL Ratio: 4
Triglycerides: 63 mg/dL (ref 0.0–149.0)
VLDL: 12.6 mg/dL (ref 0.0–40.0)

## 2022-12-10 LAB — HEMOGLOBIN A1C: Hgb A1c MFr Bld: 5 % (ref 4.6–6.5)

## 2022-12-10 LAB — HEPATITIS C ANTIBODY: Hepatitis C Ab: NONREACTIVE

## 2022-12-31 ENCOUNTER — Encounter: Payer: Self-pay | Admitting: Neurology

## 2022-12-31 ENCOUNTER — Ambulatory Visit: Payer: BC Managed Care – PPO | Admitting: Neurology

## 2022-12-31 VITALS — BP 108/74 | HR 73 | Ht 64.0 in | Wt 219.0 lb

## 2022-12-31 DIAGNOSIS — G932 Benign intracranial hypertension: Secondary | ICD-10-CM | POA: Diagnosis not present

## 2022-12-31 MED ORDER — SUMATRIPTAN SUCCINATE 50 MG PO TABS: 50.0000 mg | ORAL_TABLET | ORAL | 6 refills | Status: DC | PRN

## 2022-12-31 MED ORDER — TOPIRAMATE 100 MG PO TABS: 100.0000 mg | ORAL_TABLET | Freq: Two times a day (BID) | ORAL | 3 refills | Status: DC

## 2022-12-31 NOTE — Progress Notes (Addendum)
PATIENT: [name] DOB: 19-Mar-1986  REASON FOR VISIT: Follow up for IIH HISTORY FROM: Patient PRIMARY NEUROLOGIST: Dr. Terrace Arabia  HISTORY  [name], is a 37 year old female, seen in request by ophthalmologist Dr. Dione Booze for evaluation of bilateral papillary edema, her primary care PA Alma Downs, [place], initial evaluation was on September 26, 2021   I reviewed and summarized the referring note. PMHx. Inflammatory arthritis, under the care of [facility] Dr. Aryl, taking Plaquenil Depression, anxiety.   Patient was diagnosed with inflammatory arthritis, taking Plaquenil, over the past few years, she was seen by Dr. Alden Hipp on a yearly basis, there was no significant abnormality find in the past, but at his most recent evaluation on July 14, 2021, posterior examination showed nasal disc edema bilaterally, mild blurring of margin, no disc pallor, CDR 0.1 left, 0.15 right eye. 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   She did report rapid weight gain of 50 pounds over the past 2 years  She had long history of intermittent headache, used to only have couple times each month, since 2022, she has more frequent headaches, at least couple times each week, taking frequent ibuprofen with suboptimal response  She works at a desk job, complains of intermittent blurry vision, especially with sudden positional change, such as bending over,  Update December 09, 2021 SS: MRI of the brain without contrast in October 2022 showed no significant abnormality, there was evidence of 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  LP on 10/09/21 showed opening pressure of 40 cm water consistent with diagnosis of IIH.  Doing well on Topamax 200 mg at bedtime. Headaches are improved, less frequent, are 2-3 a month, less intense. Takes Imitrex, with good benefit, does make  her nauseated. Saw Dr. Dione Booze in November, swelling was improved. Will follow up in 1 year with him. Has lost 15 lbs since October. Has been doing walking for exercise. Works 2 jobs, at [employer] and [employer], has 2 daughters (8 & 10).  Update 06/17/22 SS:  taking Topamax 200 mg at bedtime. Some family stress, no further weight loss. On the end of Ozempic. Some headaches with sinus/ear infections. Takes Imitrex for migraines, only 1 a week, with fast relief. Sees Dr. Dione Booze in November.   Update December 31, 2022 SS: Has lost 28 lbs since July. Exercising, watching her diet. Sinus issues. Remains on Topamax 200 mg at bedtime. Uses Imitrex PRN, takes 1 time a month. No vision changes. Has accepted new job at [employer] in [place]. Seen at [facility] on 11/09/22 by Alma Downs, PA.  The fundus exam revealed no abnormalities in the macula.  Nasal disc edema decreased bilaterally.  Clear to continue Plaquenil.  Decreasing edema on eye exam.  Follow-up in 6 months.   REVIEW OF SYSTEMS: Out of a complete 14 system review of symptoms, the patient complains only of the following symptoms, and all other reviewed systems are negative.  See HPI  ALLERGIES: Allergies  Allergen Reactions   Cephalosporins Nausea And Vomiting and Rash   Ondansetron Nausea And Vomiting and Other (See Comments)    Chest pain and nose bleeds    Zofran Nausea And Vomiting and Other (See Comments)    Chest pain and nose bleeds   Sulfamethoxazole-Trimethoprim     HOME MEDICATIONS: Outpatient Medications Prior to Visit  Medication Sig Dispense Refill   azelastine (ASTELIN) 0.1 % nasal spray  SPRAY 1 SPRAY INTO EACH NOSTRIL TWICE A DAY     EPINEPHrine 0.3 mg/0.3 mL IJ SOAJ injection SMARTSIG:0.3 Milliliter(s) IM Once PRN     famotidine (PEPCID) 40 MG tablet Take 40 mg by mouth 2 (two) times daily.     fluticasone (FLONASE) 50 MCG/ACT nasal spray USE 1 2 SPRAYS IN EACH NOSTRIL ONCE A DAY     hydroxychloroquine (PLAQUENIL)  200 MG tablet 2 TABLETS WITH FOOD OR MILK ONCE A DAY  2   levocetirizine (XYZAL) 5 MG tablet Take 5 mg by mouth daily.      levonorgestrel (MIRENA) 20 MCG/24HR IUD 1 each by Intrauterine route once. Inserted 01/19/2018, needs to be removed 01/2023.     montelukast (SINGULAIR) 10 MG tablet Take 10 mg by mouth daily.  1   SUMAtriptan (IMITREX) 50 MG tablet Take 1 tablet (50 mg total) by mouth every 2 (two) hours as needed for migraine. May repeat in 2 hours if headache persists or recurs. 12 tablet 6   topiramate (TOPAMAX) 100 MG tablet Take 1 tablet (100 mg total) by mouth 2 (two) times daily. 180 tablet 3   No facility-administered medications prior to visit.    PAST MEDICAL HISTORY: Past Medical History:  Diagnosis Date   Acid reflux    Allergy    Anxiety    Depression    Hyperlipidemia    Inflammatory arthritis 02/18/2018    PAST SURGICAL HISTORY: Past Surgical History:  Procedure Laterality Date   PERIPHERALLY INSERTED CENTRAL CATHETER INSERTION     WISDOM TOOTH EXTRACTION     WISDOM TOOTH EXTRACTION Bilateral     FAMILY HISTORY: Family History  Problem Relation Age of Onset   Hypertension Mother    Hyperlipidemia Mother    Arthritis Mother    Depression Sister    Heart disease Maternal Grandmother    Heart disease Maternal Grandfather    Heart disease Paternal Grandmother    Heart disease Paternal Grandfather    Diabetes Paternal Aunt    Diabetes Paternal Uncle    Breast cancer Neg Hx    Colon cancer Neg Hx     SOCIAL HISTORY: Social History   Socioeconomic History   Marital status: Married    Spouse name: Not on file   Number of children: Not on file   Years of education: Not on file   Highest education level: Not on file  Occupational History   Not on file  Tobacco Use   Smoking status: Never   Smokeless tobacco: Never  Vaping Use   Vaping Use: Never used  Substance and Sexual Activity   Alcohol use: Yes    Comment: very rare   Drug use: No    Sexual activity: Yes    Birth control/protection: I.U.D., None  Other Topics Concern   Not on file  Social History Narrative   Right handed   Coffee- 2 cups per week   Married with 2 kids -- daughters   Works at [employer] and [employer] (2nd shift)   Live in [place]   Social Determinants of Health   Financial Resource Strain: Not on file  Food Insecurity: Not on file  Transportation Needs: Not on file  Physical Activity: Not on file  Stress: Not on file  Social Connections: Not on file  Intimate Partner Violence: Not on file   PHYSICAL EXAM  Vitals:   12/31/22 1011  BP: 108/74  Pulse: 73  Weight: 219 lb (99.3 kg)  Height: 5\' 4"  (1.626  m)   Body mass index is 37.59 kg/m.  Generalized: Well developed, in no acute distress   Neurological examination  Mentation: Alert oriented to time, place, history taking. Follows all commands speech and language fluent Cranial nerve II-XII: Pupils were equal round reactive to light. Extraocular movements were full, visual field were full on confrontational test. Facial sensation and strength were normal. Head turning and shoulder shrug  were normal and symmetric. Motor: The motor testing reveals 5 over 5 strength of all 4 extremities. Good symmetric motor tone is noted throughout.  Sensory: Sensory testing is intact to soft touch on all 4 extremities. No evidence of extinction is noted.  Coordination: Cerebellar testing reveals good finger-nose-finger and heel-to-shin bilaterally.  Gait and station: Gait is normal. Reflexes: Deep tendon reflexes are symmetric and normal bilaterally.   DIAGNOSTIC DATA (LABS, IMAGING, TESTING) - I reviewed patient records, labs, notes, testing and imaging myself where available.  Lab Results  Component Value Date   WBC 6.5 11/14/2021   HGB 13.9 11/14/2021   HCT 41 11/14/2021   MCV 99.4 08/26/2021   PLT 359.0 08/26/2021      Component Value Date/Time   NA 140 11/14/2021 0000   K 4.0 11/14/2021 0000    CL 105 11/14/2021 0000   CO2 23 (A) 11/14/2021 0000   GLUCOSE 63 (L) 08/26/2021 1626   BUN 7 11/14/2021 0000   CREATININE 0.8 11/14/2021 0000   CREATININE 0.71 08/26/2021 1626   CALCIUM 9.0 11/14/2021 0000   PROT 7.8 08/26/2021 1626   ALBUMIN 4.1 11/14/2021 0000   ALBUMIN 5.0 10/09/2021 0901   AST 16 11/14/2021 0000   ALT 22 11/14/2021 0000   ALKPHOS 65 11/14/2021 0000   BILITOT 0.6 08/26/2021 1626   GFRNONAA 75.89 01/23/2020 0000   GFRAA 91.83 01/23/2020 0000   Lab Results  Component Value Date   CHOL 210 (H) 12/09/2022   HDL 54.90 12/09/2022   LDLCALC 142 (H) 12/09/2022   TRIG 63.0 12/09/2022   CHOLHDL 4 12/09/2022   Lab Results  Component Value Date   HGBA1C 5.0 12/09/2022   No results found for: "VITAMINB12" Lab Results  Component Value Date   TSH 2.08 08/26/2021   ASSESSMENT AND PLAN 37 y.o. year old female :  1.  Intracranial hypertension 2.  Bilateral papilledema 3.  Obesity 4.  Frequent headaches with migraine features  -Ymani continues to do quite well, commended on her efforts to lose 28 pounds in the last 6 months! -Continue Topamax 200 mg daily, Imitrex as needed for acute headache, we can update labs at next visit -Continue every 66-month follow-up with ophthalmology -Follow-up in our office in 6 months or sooner if needed   -Seen at [facility] on 11/09/22 by Alma Downs, PA.  The fundus exam revealed no abnormalities in the macula.  Nasal disc edema decreased bilaterally.  Clear to continue Plaquenil.  Decreasing edema on eye exam.  Follow-up in 6 months. 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 -LP on 10/09/21 showed opening pressure of 40 cm water consistent with diagnosis of IIH  Addendum 05/18/23 SS: Was seen at [facility] care 05/14/2023 RNFL stable, continue topiramate per neurologist.  Cleared to continue Plaquenil.   Decreasing optic nerve edema on OCT.  Follow-up in 6 months.  [name], AGNP-C, DNP 12/31/2022, 10:33 AM [facility] [address], [address] [place], [place] [address] [phone]  [phone] 

## 2022-12-31 NOTE — Patient Instructions (Signed)
Keep up the great work with your weight loss! Continue Topamax 200 mg daily, please keep up with ophthalmology visits I will see back in 6 months :)

## 2023-01-26 ENCOUNTER — Ambulatory Visit: Payer: BC Managed Care – PPO | Admitting: Physician Assistant

## 2023-01-26 ENCOUNTER — Encounter: Payer: Self-pay | Admitting: Physician Assistant

## 2023-01-26 VITALS — BP 110/70 | HR 95 | Temp 97.5°F | Ht 64.0 in | Wt 222.2 lb

## 2023-01-26 DIAGNOSIS — J029 Acute pharyngitis, unspecified: Secondary | ICD-10-CM | POA: Diagnosis not present

## 2023-01-26 LAB — POC COVID19 BINAXNOW: SARS Coronavirus 2 Ag: NEGATIVE

## 2023-01-26 LAB — POCT RAPID STREP A (OFFICE): Rapid Strep A Screen: NEGATIVE

## 2023-01-26 MED ORDER — AMOXICILLIN 875 MG PO TABS: 875.0000 mg | ORAL_TABLET | Freq: Two times a day (BID) | ORAL | 0 refills | Status: AC

## 2023-01-26 MED ORDER — IBUPROFEN 600 MG PO TABS: 600.0000 mg | ORAL_TABLET | Freq: Three times a day (TID) | ORAL | 0 refills | Status: DC | PRN

## 2023-01-26 NOTE — Progress Notes (Signed)
[name] is a 37 y.o. female here for a new problem.  History of Present Illness:   Chief Complaint  Patient presents with   Sore Throat    Pt c/o sore throat, nasal congestion , sinus pressure off and on, sinus headaches. Has been taking 1500 mg of Tylenol BID.    Sore Throat     Sore throat Sunday after work started having throat pain, HA, chills Taking extra tylenol without improvement Has some slight ear pain  Denies: fever, SOB, vomiting, diarrhea, confusion, neck stiffness   Past Medical History:  Diagnosis Date   Acid reflux    Allergy    Anxiety    Depression    Hyperlipidemia    Inflammatory arthritis 02/18/2018     Social History   Tobacco Use   Smoking status: Never   Smokeless tobacco: Never  Vaping Use   Vaping Use: Never used  Substance Use Topics   Alcohol use: Yes    Comment: very rare   Drug use: No    Past Surgical History:  Procedure Laterality Date   PERIPHERALLY INSERTED CENTRAL CATHETER INSERTION     WISDOM TOOTH EXTRACTION     WISDOM TOOTH EXTRACTION Bilateral     Family History  Problem Relation Age of Onset   Hypertension Mother    Hyperlipidemia Mother    Arthritis Mother    Depression Sister    Heart disease Maternal Grandmother    Heart disease Maternal Grandfather    Heart disease Paternal Grandmother    Heart disease Paternal Grandfather    Diabetes Paternal Aunt    Diabetes Paternal Uncle    Breast cancer Neg Hx    Colon cancer Neg Hx     Allergies  Allergen Reactions   Cephalosporins Nausea And Vomiting and Rash   Ondansetron Nausea And Vomiting and Other (See Comments)    Chest pain and nose bleeds    Zofran Nausea And Vomiting and Other (See Comments)    Chest pain and nose bleeds   Sulfamethoxazole-Trimethoprim     Current Medications:   Current Outpatient Medications:    azelastine (ASTELIN) 0.1 % nasal spray, SPRAY 1 SPRAY INTO EACH NOSTRIL TWICE A DAY, Disp: , Rfl:    EPINEPHrine 0.3 mg/0.3  mL IJ SOAJ injection, SMARTSIG:0.3 Milliliter(s) IM Once PRN, Disp: , Rfl:    famotidine (PEPCID) 40 MG tablet, Take 40 mg by mouth 2 (two) times daily., Disp: , Rfl:    fluticasone (FLONASE) 50 MCG/ACT nasal spray, USE 1 2 SPRAYS IN EACH NOSTRIL ONCE A DAY, Disp: , Rfl:    hydroxychloroquine (PLAQUENIL) 200 MG tablet, 2 TABLETS WITH FOOD OR MILK ONCE A DAY, Disp: , Rfl: 2   levocetirizine (XYZAL) 5 MG tablet, Take 5 mg by mouth daily. , Disp: , Rfl:    levonorgestrel (MIRENA) 20 MCG/24HR IUD, 1 each by Intrauterine route once. Inserted 01/19/2018, needs to be removed 01/2023., Disp: , Rfl:    montelukast (SINGULAIR) 10 MG tablet, Take 10 mg by mouth daily., Disp: , Rfl: 1   SUMAtriptan (IMITREX) 50 MG tablet, Take 1 tablet (50 mg total) by mouth every 2 (two) hours as needed for migraine. May repeat in 2 hours if headache persists or recurs., Disp: 12 tablet, Rfl: 6   topiramate (TOPAMAX) 100 MG tablet, Take 1 tablet (100 mg total) by mouth 2 (two) times daily., Disp: 180 tablet, Rfl: 3   Review of Systems:   ROS Negative unless otherwise specified per HPI.  Vitals:   Vitals:   01/26/23 1304  BP: 110/70  Pulse: 95  Temp: (!) 97.5 F (36.4 C)  TempSrc: Temporal  SpO2: 99%  Weight: 222 lb 4 oz (100.8 kg)  Height: 5' 4"$  (1.626 m)     Body mass index is 38.15 kg/m.  Physical Exam:   Physical Exam Vitals and nursing note reviewed.  Constitutional:      General: She is not in acute distress.    Appearance: She is well-developed. She is not ill-appearing or toxic-appearing.  HENT:     Head: Normocephalic and atraumatic.     Right Ear: Ear canal and external ear normal. Tympanic membrane is erythematous. Tympanic membrane is not retracted or bulging.     Left Ear: Ear canal and external ear normal. Tympanic membrane is erythematous. Tympanic membrane is not retracted or bulging.     Nose: Nose normal.     Right Sinus: No maxillary sinus tenderness or frontal sinus tenderness.      Left Sinus: No maxillary sinus tenderness or frontal sinus tenderness.     Mouth/Throat:     Pharynx: Uvula midline. Posterior oropharyngeal erythema present.     Tonsils: 2+ on the right. 2+ on the left.  Eyes:     General: Lids are normal.     Conjunctiva/sclera: Conjunctivae normal.  Neck:     Trachea: Trachea normal.  Cardiovascular:     Rate and Rhythm: Normal rate and regular rhythm.     Heart sounds: Normal heart sounds, S1 normal and S2 normal.  Pulmonary:     Effort: Pulmonary effort is normal.     Breath sounds: Normal breath sounds. No decreased breath sounds, wheezing, rhonchi or rales.  Lymphadenopathy:     Cervical: No cervical adenopathy.  Skin:    General: Skin is warm and dry.  Neurological:     Mental Status: She is alert.  Psychiatric:        Speech: Speech normal.        Behavior: Behavior normal. Behavior is cooperative.    Results for orders placed or performed in visit on 01/26/23  POCT rapid strep A  Result Value Ref Range   Rapid Strep A Screen Negative Negative  POC COVID-19 BinaxNow  Result Value Ref Range   SARS Coronavirus 2 Ag Negative Negative     Assessment and Plan:   Sore throat POC testing negative Bilateral AOM and suspected tonsillitis Start amoxicillin 875 mg BID x 10 days Refill ibuprofen 600 mg TID prn Follow-up if new/worsening sx   Inda Coke, PA-C

## 2023-02-23 ENCOUNTER — Encounter: Payer: Self-pay | Admitting: Physician Assistant

## 2023-04-15 ENCOUNTER — Telehealth: Payer: Self-pay | Admitting: Neurology

## 2023-04-15 NOTE — Telephone Encounter (Signed)
LVM and sent MyChart message informing pt of r/s needed for 7/31 appt- NP out.

## 2023-05-13 LAB — BASIC METABOLIC PANEL
BUN: 12 (ref 4–21)
CO2: 19 (ref 13–22)
Chloride: 105 (ref 99–108)
Creatinine: 0.8 (ref 0.5–1.1)
Glucose: 80
Potassium: 4 meq/L (ref 3.5–5.1)
Sodium: 137 (ref 137–147)

## 2023-05-13 LAB — CBC AND DIFFERENTIAL
HCT: 43 (ref 36–46)
Hemoglobin: 14.3 (ref 12.0–16.0)
Neutrophils Absolute: 5.3
Platelets: 360 10*3/uL (ref 150–400)
WBC: 9.1

## 2023-05-13 LAB — HEPATIC FUNCTION PANEL
ALT: 24 U/L (ref 7–35)
AST: 18 (ref 13–35)
Alkaline Phosphatase: 69 (ref 25–125)
Bilirubin, Total: 0.3

## 2023-05-13 LAB — COMPREHENSIVE METABOLIC PANEL
Albumin: 4.5 (ref 3.5–5.0)
Calcium: 9.3 (ref 8.7–10.7)
Globulin: 3.2
eGFR: 93

## 2023-05-13 LAB — CBC: RBC: 4.35 (ref 3.87–5.11)

## 2023-05-17 ENCOUNTER — Encounter: Payer: Self-pay | Admitting: Physician Assistant

## 2023-06-03 ENCOUNTER — Ambulatory Visit: Payer: BC Managed Care – PPO | Admitting: Physician Assistant

## 2023-06-03 ENCOUNTER — Encounter: Payer: Self-pay | Admitting: Physician Assistant

## 2023-06-03 VITALS — BP 128/82 | HR 77 | Temp 97.5°F | Ht 64.0 in | Wt 231.8 lb

## 2023-06-03 DIAGNOSIS — H6690 Otitis media, unspecified, unspecified ear: Secondary | ICD-10-CM | POA: Diagnosis not present

## 2023-06-03 MED ORDER — FLUCONAZOLE 150 MG PO TABS: ORAL_TABLET | ORAL | 0 refills | Status: DC

## 2023-06-03 MED ORDER — CLINDAMYCIN HCL 300 MG PO CAPS: 300.0000 mg | ORAL_CAPSULE | Freq: Two times a day (BID) | ORAL | 0 refills | Status: DC

## 2023-06-03 NOTE — Progress Notes (Signed)
[name] is a 37 y.o. female here for a recurrence of a previously resolved problem.  History of Present Illness:   Chief Complaint  Patient presents with   Acute Visit    Recurring ear infection ,had  on in  Feb and April and think 's she has one now, pain in both ears , headaches, no runny , no coughing , no fever,  some sore throat .    HPI  Ear Infection: She complains of a reoccurring ear infection that first began in February. Had second ear infection in April, went to [facility] each time.  Endorses bilateral ear pain (worse in her right), neck pain, mild sore throat, mild chills, sinus congestion and associated headache. Denies fevers, chills, nausea and vomiting and diarrhea.  At her last visit with Dr. Sidney Ace she was prescribed albuterol inhaler. She denies coughing, fever, or a runny nose.   Past Medical History:  Diagnosis Date   Acid reflux    Allergy    Anxiety    Depression    Hyperlipidemia    Inflammatory arthritis 02/18/2018     Social History   Tobacco Use   Smoking status: Never   Smokeless tobacco: Never  Vaping Use   Vaping Use: Never used  Substance Use Topics   Alcohol use: Yes    Comment: very rare   Drug use: No    Past Surgical History:  Procedure Laterality Date   PERIPHERALLY INSERTED CENTRAL CATHETER INSERTION     WISDOM TOOTH EXTRACTION     WISDOM TOOTH EXTRACTION Bilateral     Family History  Problem Relation Age of Onset   Hypertension Mother    Hyperlipidemia Mother    Arthritis Mother    Depression Sister    Heart disease Maternal Grandmother    Heart disease Maternal Grandfather    Heart disease Paternal Grandmother    Heart disease Paternal Grandfather    Diabetes Paternal Aunt    Diabetes Paternal Uncle    Breast cancer Neg Hx    Colon cancer Neg Hx     Allergies  Allergen Reactions   Cephalosporins Nausea And Vomiting and Rash   Ondansetron Nausea And Vomiting and Other (See Comments)     Chest pain and nose bleeds    Zofran Nausea And Vomiting and Other (See Comments)    Chest pain and nose bleeds   Sulfamethoxazole-Trimethoprim     Current Medications:   Current Outpatient Medications:    azelastine (ASTELIN) 0.1 % nasal spray, SPRAY 1 SPRAY INTO EACH NOSTRIL TWICE A DAY, Disp: , Rfl:    clindamycin (CLEOCIN) 300 MG capsule, Take 1 capsule (300 mg total) by mouth in the morning and at bedtime., Disp: 14 capsule, Rfl: 0   EPINEPHrine 0.3 mg/0.3 mL IJ SOAJ injection, SMARTSIG:0.3 Milliliter(s) IM Once PRN, Disp: , Rfl:    famotidine (PEPCID) 40 MG tablet, Take 40 mg by mouth 2 (two) times daily., Disp: , Rfl:    fluconazole (DIFLUCAN) 150 MG tablet, Take 1 tablet by mouth. If symptoms persist, may take an additional tablet in 3-5 days. May repeat for a total of 3 tablets., Disp: 3 tablet, Rfl: 0   fluticasone (FLONASE) 50 MCG/ACT nasal spray, USE 1 2 SPRAYS IN EACH NOSTRIL ONCE A DAY, Disp: , Rfl:    hydroxychloroquine (PLAQUENIL) 200 MG tablet, 2 TABLETS WITH FOOD OR MILK ONCE A DAY, Disp: , Rfl: 2   ibuprofen (ADVIL) 600 MG tablet, Take 1 tablet (600 mg total)  by mouth every 8 (eight) hours as needed., Disp: 30 tablet, Rfl: 0   levocetirizine (XYZAL) 5 MG tablet, Take 5 mg by mouth every evening., Disp: , Rfl:    levonorgestrel (MIRENA) 20 MCG/24HR IUD, 1 each by Intrauterine route once. Inserted 01/19/2018, needs to be removed 01/2023., Disp: , Rfl:    montelukast (SINGULAIR) 10 MG tablet, Take 10 mg by mouth daily., Disp: , Rfl: 1   SUMAtriptan (IMITREX) 50 MG tablet, Take 1 tablet (50 mg total) by mouth every 2 (two) hours as needed for migraine. May repeat in 2 hours if headache persists or recurs., Disp: 12 tablet, Rfl: 6   topiramate (TOPAMAX) 100 MG tablet, Take 1 tablet (100 mg total) by mouth 2 (two) times daily., Disp: 180 tablet, Rfl: 3   Review of Systems:   Review of Systems  Constitutional:  Positive for chills.  HENT:  Positive for congestion, ear pain  (bilateral), sinus pain (with associated headache) and sore throat.   Musculoskeletal:  Positive for neck pain.    Vitals:   Vitals:   06/03/23 0811  BP: 128/82  Pulse: 77  Temp: (!) 97.5 F (36.4 C)  SpO2: 98%  Weight: 231 lb 12.8 oz (105.1 kg)  Height: 5\' 4"  (1.626 m)     Body mass index is 39.79 kg/m.  Physical Exam:   Physical Exam Vitals and nursing note reviewed.  Constitutional:      General: She is not in acute distress.    Appearance: She is well-developed. She is not ill-appearing or toxic-appearing.  HENT:     Head: Normocephalic and atraumatic.     Right Ear: Ear canal and external ear normal. A middle ear effusion is present. Tympanic membrane is erythematous. Tympanic membrane is not retracted or bulging.     Left Ear: Ear canal and external ear normal. A middle ear effusion is present. Tympanic membrane is erythematous. Tympanic membrane is not retracted or bulging.     Nose: Nose normal.     Right Sinus: No maxillary sinus tenderness or frontal sinus tenderness.     Left Sinus: No maxillary sinus tenderness or frontal sinus tenderness.     Mouth/Throat:     Pharynx: Uvula midline. No posterior oropharyngeal erythema.  Eyes:     General: Lids are normal.     Conjunctiva/sclera: Conjunctivae normal.  Neck:     Trachea: Trachea normal.  Cardiovascular:     Rate and Rhythm: Normal rate and regular rhythm.     Heart sounds: Normal heart sounds, S1 normal and S2 normal.  Pulmonary:     Effort: Pulmonary effort is normal.     Breath sounds: Normal breath sounds. No decreased breath sounds, wheezing, rhonchi or rales.  Lymphadenopathy:     Cervical: No cervical adenopathy.  Skin:    General: Skin is warm and dry.  Neurological:     Mental Status: She is alert.  Psychiatric:        Speech: Speech normal.        Behavior: Behavior normal. Behavior is cooperative.     Assessment and Plan:   Recurrent AOM (acute otitis media) Referral to ENT Current  exam consistent with b/l ear infection Will prescribe clindamycin for her symptoms - does not respond to other antibiotic(s) per her report Recommend follow-up with [place] if any new/worsening sx  I,Emily Lagle,acting as a scribe for [employer], PA.,have documented all relevant documentation on the behalf of Jarold Motto, PA,as directed by  Jarold Motto, PA  while in the presence of Jarold Motto, [place].  I, Jarold Motto, [place], have reviewed all documentation for this visit. The documentation on 06/03/23 for the exam, diagnosis, procedures, and orders are all accurate and complete.  Jarold Motto, PA-C

## 2023-06-03 NOTE — Patient Instructions (Signed)
It was great to see you!  Start antibiotic(s) Continue probiotic Use inhaler for chest tightness as needed  Keep me posted  ENT referral placed  Take care,  Jarold Motto PA-C

## 2023-06-08 ENCOUNTER — Encounter: Payer: Self-pay | Admitting: Physician Assistant

## 2023-06-15 ENCOUNTER — Ambulatory Visit: Payer: BC Managed Care – PPO | Admitting: Physician Assistant

## 2023-06-15 ENCOUNTER — Encounter: Payer: Self-pay | Admitting: Physician Assistant

## 2023-06-15 VITALS — BP 110/80 | HR 82 | Temp 98.7°F | Ht 64.0 in | Wt 233.2 lb

## 2023-06-15 DIAGNOSIS — H9203 Otalgia, bilateral: Secondary | ICD-10-CM | POA: Diagnosis not present

## 2023-06-15 DIAGNOSIS — R6889 Other general symptoms and signs: Secondary | ICD-10-CM

## 2023-06-15 LAB — CBC
HCT: 43.1 % (ref 36.0–46.0)
Hemoglobin: 14.2 g/dL (ref 12.0–15.0)
MCHC: 32.9 g/dL (ref 30.0–36.0)
MCV: 100.7 fl — ABNORMAL HIGH (ref 78.0–100.0)
Platelets: 345 10*3/uL (ref 150.0–400.0)
RBC: 4.28 Mil/uL (ref 3.87–5.11)
RDW: 13.2 % (ref 11.5–15.5)
WBC: 9.4 10*3/uL (ref 4.0–10.5)

## 2023-06-15 LAB — URINALYSIS, ROUTINE W REFLEX MICROSCOPIC
Bilirubin Urine: NEGATIVE
Hgb urine dipstick: NEGATIVE
Ketones, ur: NEGATIVE
Leukocytes,Ua: NEGATIVE
Nitrite: NEGATIVE
RBC / HPF: NONE SEEN (ref 0–?)
Specific Gravity, Urine: 1.02 (ref 1.000–1.030)
Total Protein, Urine: NEGATIVE
Urine Glucose: NEGATIVE
Urobilinogen, UA: 0.2 (ref 0.0–1.0)
pH: 6 (ref 5.0–8.0)

## 2023-06-15 LAB — COMPREHENSIVE METABOLIC PANEL
ALT: 26 U/L (ref 0–35)
AST: 15 U/L (ref 0–37)
Albumin: 4.5 g/dL (ref 3.5–5.2)
Alkaline Phosphatase: 53 U/L (ref 39–117)
BUN: 12 mg/dL (ref 6–23)
CO2: 21 mEq/L (ref 19–32)
Calcium: 9.5 mg/dL (ref 8.4–10.5)
Chloride: 108 mEq/L (ref 96–112)
Creatinine, Ser: 0.82 mg/dL (ref 0.40–1.20)
GFR: 91.28 mL/min (ref >60.00)
Glucose, Bld: 79 mg/dL (ref 70–99)
Potassium: 3.9 mEq/L (ref 3.5–5.1)
Sodium: 136 mEq/L (ref 135–145)
Total Bilirubin: 0.6 mg/dL (ref 0.2–1.2)
Total Protein: 7.5 g/dL (ref 6.0–8.3)

## 2023-06-15 LAB — TSH: TSH: 3.02 u[IU]/mL (ref 0.35–5.50)

## 2023-06-15 MED ORDER — IBUPROFEN 600 MG PO TABS: 600.0000 mg | ORAL_TABLET | Freq: Three times a day (TID) | ORAL | 0 refills | Status: AC | PRN

## 2023-06-15 NOTE — Progress Notes (Signed)
[name] is a 37 y.o. female here for a follow up of a pre-existing problem.  History of Present Illness:   Chief Complaint  Patient presents with   Otalgia    Pt c/o bilateral ear pain x 3 weeks, right > left.    Sinus Problem    Pt c/o sinus issue, facial pain, slight cough expectorating green sputum, green nasal drainage, neck pain and fatigue x 3 weeks. Has been taking Mucinex DM    Ear pain: She continues having ear pain and her left is worse than her right. She also has had ongoing body aches, occasional cough, throat pain, neck pain and fatigue since her last visit.  She completed her clindamycin abx since her last visit.  She is also experiencing night sweats.  She denies fever or any recent tick bites.   Continues taking tylenol and reports no new issues while taking it.  She is sleeping ok while taking her cold and flu medication.  She is trying to maintain her food and fluid intake.     Past Medical History:  Diagnosis Date   Acid reflux    Allergy    Anxiety    Depression    Hyperlipidemia    Inflammatory arthritis 02/18/2018     Social History   Tobacco Use   Smoking status: Never   Smokeless tobacco: Never  Vaping Use   Vaping Use: Never used  Substance Use Topics   Alcohol use: Yes    Comment: very rare   Drug use: No    Past Surgical History:  Procedure Laterality Date   PERIPHERALLY INSERTED CENTRAL CATHETER INSERTION     WISDOM TOOTH EXTRACTION     WISDOM TOOTH EXTRACTION Bilateral     Family History  Problem Relation Age of Onset   Hypertension Mother    Hyperlipidemia Mother    Arthritis Mother    Depression Sister    Heart disease Maternal Grandmother    Heart disease Maternal Grandfather    Heart disease Paternal Grandmother    Heart disease Paternal Grandfather    Diabetes Paternal Aunt    Diabetes Paternal Uncle    Breast cancer Neg Hx    Colon cancer Neg Hx     Allergies  Allergen Reactions   Cephalosporins Nausea  And Vomiting and Rash   Ondansetron Nausea And Vomiting and Other (See Comments)    Chest pain and nose bleeds    Zofran Nausea And Vomiting and Other (See Comments)    Chest pain and nose bleeds   Sulfamethoxazole-Trimethoprim     Current Medications:   Current Outpatient Medications:    albuterol (VENTOLIN HFA) 108 (90 Base) MCG/ACT inhaler, Inhale 2 puffs into the lungs every 6 (six) hours as needed., Disp: , Rfl:    azelastine (ASTELIN) 0.1 % nasal spray, SPRAY 1 SPRAY INTO EACH NOSTRIL TWICE A DAY, Disp: , Rfl:    EPINEPHrine 0.3 mg/0.3 mL IJ SOAJ injection, SMARTSIG:0.3 Milliliter(s) IM Once PRN, Disp: , Rfl:    famotidine (PEPCID) 40 MG tablet, Take 40 mg by mouth 2 (two) times daily., Disp: , Rfl:    fluticasone (FLONASE) 50 MCG/ACT nasal spray, USE 1 2 SPRAYS IN EACH NOSTRIL ONCE A DAY, Disp: , Rfl:    hydroxychloroquine (PLAQUENIL) 200 MG tablet, 2 TABLETS WITH FOOD OR MILK ONCE A DAY, Disp: , Rfl: 2   levocetirizine (XYZAL) 5 MG tablet, Take 5 mg by mouth every evening., Disp: , Rfl:    levonorgestrel (MIRENA) 20  MCG/24HR IUD, 1 each by Intrauterine route once. Inserted 01/19/2018, needs to be removed 01/2023., Disp: , Rfl:    montelukast (SINGULAIR) 10 MG tablet, Take 10 mg by mouth daily., Disp: , Rfl: 1   SUMAtriptan (IMITREX) 50 MG tablet, Take 1 tablet (50 mg total) by mouth every 2 (two) hours as needed for migraine. May repeat in 2 hours if headache persists or recurs., Disp: 12 tablet, Rfl: 6   topiramate (TOPAMAX) 100 MG tablet, Take 1 tablet (100 mg total) by mouth 2 (two) times daily., Disp: 180 tablet, Rfl: 3   ibuprofen (ADVIL) 600 MG tablet, Take 1 tablet (600 mg total) by mouth every 8 (eight) hours as needed., Disp: 30 tablet, Rfl: 0   Review of Systems:   Review of Systems  HENT:  Positive for ear pain ((L>R)) and sore throat.   Respiratory:  Positive for cough (occasional).   Musculoskeletal:  Positive for neck pain.       (+)general body aches  Neurological:   Positive for dizziness.    Vitals:   Vitals:   06/15/23 1331  BP: 110/80  Pulse: 82  Temp: 98.7 F (37.1 C)  TempSrc: Temporal  SpO2: 97%  Weight: 233 lb 4 oz (105.8 kg)  Height: 5\' 4"  (1.626 m)     Body mass index is 40.04 kg/m.  Physical Exam:   Physical Exam Vitals and nursing note reviewed.  Constitutional:      General: She is not in acute distress.    Appearance: She is well-developed. She is not ill-appearing or toxic-appearing.  HENT:     Head: Normocephalic and atraumatic.     Right Ear: Ear canal and external ear normal. No middle ear effusion. Tympanic membrane is erythematous. Tympanic membrane is not retracted or bulging.     Left Ear: Tympanic membrane, ear canal and external ear normal. Tympanic membrane is not erythematous, retracted or bulging.     Nose: Nose normal.     Right Sinus: No maxillary sinus tenderness or frontal sinus tenderness.     Left Sinus: No maxillary sinus tenderness or frontal sinus tenderness.     Mouth/Throat:     Pharynx: Uvula midline. No posterior oropharyngeal erythema.  Eyes:     General: Lids are normal.     Conjunctiva/sclera: Conjunctivae normal.  Neck:     Trachea: Trachea normal.  Cardiovascular:     Rate and Rhythm: Normal rate and regular rhythm.     Heart sounds: Normal heart sounds, S1 normal and S2 normal.  Pulmonary:     Effort: Pulmonary effort is normal.     Breath sounds: Normal breath sounds. No decreased breath sounds, wheezing, rhonchi or rales.  Musculoskeletal:     Comments: Able to move chin to chest without pain/difficulty Normal ROM of neck  Lymphadenopathy:     Cervical: No cervical adenopathy.  Skin:    General: Skin is warm and dry.  Neurological:     Mental Status: She is alert.  Psychiatric:        Speech: Speech normal.        Behavior: Behavior normal. Behavior is cooperative.      Assessment and Plan:   Otalgia of both ears; Flu-like symptoms Referral to ENT made -- appointment  secured for next week Encouraged her to take oral ibuprofen - I have sent this in -- if no improvement, may need to change to oral prednisone Due to ongoing flu-like symptom(s) we will obtain blood work No evidence of  meningismus on my exam, however we did discuss that if blood work is concerning or if symptom(s) worsen, we may need to obtain STAT imaging She was agreeable to plan Discussed that if any symptom(s) worsen, she is to go to the ER  [employer] as a scribe for [employer], PA.,have documented all relevant documentation on the behalf of Jarold Motto, PA,as directed by  Jarold Motto, PA while in the presence of Jarold Motto, [place].  I, Jarold Motto, [place], have reviewed all documentation for this visit. The documentation on 06/15/23 for the exam, diagnosis, procedures, and orders are all accurate and complete.   Jarold Motto, PA-C

## 2023-06-15 NOTE — Patient Instructions (Signed)
It was great to see you!  Blood work and urine today We will be in touch tomorrow to figure out if we need to get head CT  If worsening symptom(s), GO TO THE ER    Take care,  Jarold Motto PA-C

## 2023-06-16 ENCOUNTER — Encounter: Payer: Self-pay | Admitting: Physician Assistant

## 2023-06-16 LAB — URINE CULTURE
MICRO NUMBER:: 15153136
SPECIMEN QUALITY:: ADEQUATE

## 2023-06-16 NOTE — Telephone Encounter (Signed)
See note

## 2023-06-24 ENCOUNTER — Encounter (INDEPENDENT_AMBULATORY_CARE_PROVIDER_SITE_OTHER): Payer: Self-pay | Admitting: Otolaryngology

## 2023-06-24 ENCOUNTER — Ambulatory Visit (INDEPENDENT_AMBULATORY_CARE_PROVIDER_SITE_OTHER): Payer: BC Managed Care – PPO | Admitting: Otolaryngology

## 2023-06-24 VITALS — BP 106/71 | HR 82

## 2023-06-24 DIAGNOSIS — G43009 Migraine without aura, not intractable, without status migrainosus: Secondary | ICD-10-CM | POA: Diagnosis not present

## 2023-06-24 DIAGNOSIS — J301 Allergic rhinitis due to pollen: Secondary | ICD-10-CM

## 2023-06-24 DIAGNOSIS — M26623 Arthralgia of bilateral temporomandibular joint: Secondary | ICD-10-CM

## 2023-06-24 DIAGNOSIS — R519 Headache, unspecified: Secondary | ICD-10-CM

## 2023-06-24 DIAGNOSIS — R0981 Nasal congestion: Secondary | ICD-10-CM | POA: Diagnosis not present

## 2023-06-24 DIAGNOSIS — H9203 Otalgia, bilateral: Secondary | ICD-10-CM | POA: Diagnosis not present

## 2023-06-24 DIAGNOSIS — J3089 Other allergic rhinitis: Secondary | ICD-10-CM

## 2023-06-24 NOTE — Progress Notes (Signed)
ENT CONSULT:  Reason for Consult: ear infection     HPI: [name] is an 37 y.o. female is here after an episode of an ear infection treated with Clarithromycin when she was seen by her PCP. She had four ear infections over the last 12 months. She describes b/l ear pain, headache when they occur. No ear drainage. She is on Xyzal and Singular, and on Azelastine  She is on allergy shots for 6 years at [facility]. She had repeat testing for allergies last year and it showed she had improved after immunotherapy was started. She has hx of migraines and takes Topamax. She uses albuterol as needed (once a week) for reactive airway disease. She has asthma and had prednisone and abx once in April of this year.  Seeing Neurology for migraine already.   Records Reviewed:  Allergies to Bactrim and Cephalosporins (vomiting). She also had rash.     Past Medical History:  Diagnosis Date   Acid reflux    Allergy    Anxiety    Depression    Hyperlipidemia    Inflammatory arthritis 02/18/2018    Past Surgical History:  Procedure Laterality Date   PERIPHERALLY INSERTED CENTRAL CATHETER INSERTION     WISDOM TOOTH EXTRACTION     WISDOM TOOTH EXTRACTION Bilateral     Family History  Problem Relation Age of Onset   Hypertension Mother    Hyperlipidemia Mother    Arthritis Mother    Depression Sister    Heart disease Maternal Grandmother    Heart disease Maternal Grandfather    Heart disease Paternal Grandmother    Heart disease Paternal Grandfather    Diabetes Paternal Aunt    Diabetes Paternal Uncle    Breast cancer Neg Hx    Colon cancer Neg Hx     Social History:  reports that she has never smoked. She has never used smokeless tobacco. She reports current alcohol use. She reports that she does not use drugs.  Allergies:  Allergies  Allergen Reactions   Cephalosporins Nausea And Vomiting and Rash   Ondansetron Nausea And Vomiting and Other (See Comments)     Chest pain and nose bleeds    Zofran Nausea And Vomiting and Other (See Comments)    Chest pain and nose bleeds   Sulfamethoxazole-Trimethoprim     Medications: I have reviewed the patient's current medications.  No results found for this or any previous visit (from the past 48 hour(s)).  No results found.  The PMH, PSH, Medications, Allergies, and SH were reviewed and updated.  ROS: Constitutional: Negative for fever, weight loss and weight gain. Cardiovascular: Negative for chest pain and dyspnea on exertion. Respiratory: Is not experiencing shortness of breath at rest. Gastrointestinal: Negative for nausea and vomiting. Neurological: Negative for headaches. Psychiatric: The patient is not nervous/anxious  Blood pressure 106/71, pulse 82, SpO2 95%.  PHYSICAL EXAM:  Exam: General: Well-developed, well-nourished Communication and Voice: Clear pitch and clarity Respiratory Respiratory effort: Equal inspiration and expiration without stridor Cardiovascular Peripheral Vascular: Warm extremities with equal color/perfusion Eyes: No nystagmus with equal extraocular motion bilaterally Neuro/Psych/Balance: Patient oriented to person, place, and time; Appropriate mood and affect; Gait is intact with no imbalance; Cranial nerves I-XII are intact Head and Face Inspection: Normocephalic and atraumatic without mass or lesion Palpation: Facial skeleton intact without bony stepoffs Salivary Glands: No mass or tenderness Facial Strength: Facial motility symmetric and full bilaterally ENT Pinna: External ear intact and fully developed External canal: Canal  is patent with intact skin Tympanic Membrane: Clear and mobile External Nose: No scar or anatomic deformity Internal Nose: Septum intact and midline. No edema, polyp, or rhinorrhea Lips, Teeth, and gums: Mucosa and teeth intact and viable TMJ: No pain to palpation with full mobility Oral cavity/oropharynx: No erythema or exudate, no  lesions present Nasopharynx: No mass or lesion with intact mucosa Hypopharynx: Intact mucosa without pooling of secretions Larynx Glottic: Full true vocal cord mobility without lesion or mass Supraglottic: Normal appearing epiglottis and AE folds Interarytenoid Space: No or minimal pachydermia or edema Subglottic Space: Patent without lesion or edema Neck Neck and Trachea: Midline trachea without mass or lesion Thyroid: No mass or nodularity Lymphatics: No lymphadenopathy  Procedure: none   Studies Reviewed:MRI brain 10/01/21  EXAM: MRI brain without contrast TECHNIQUE: Sagittal T1, axial T1, T2, FLAIR, DWI, GRE, coronal T2 images were obtained CONTRAST: None IMAGING SITE: [facility] imaging at [address]   FINDINGS: The brain parenchyma shows no abnormal signal intensities.  No structural lesion, tumor or infarcts are noted.  The subarachnoid space and ventricular system appear normal.  Cortical sulci and gyri show normal appearance.  Calvarium shows no abnormalities.  Orbits appear unremarkable.  There does not appear to be increase CSF signal around the optic nerves.  Paranasal sinuses show mild chronic inflammatory changes.  The pituitary gland is partially atrophied cerebellar tonsils appear normal.  There is signal in the clivus and appears more normal and improved compared to the previous scan .  Flow-voids of large vessels of intracranial circulation appear to be patent.         IMPRESSION: MRI scan of the brain without contrast showing no significant brain parenchymal abnormalities.  Incidental findings of mild paranasal sinusitis and partially empty sella appears to be new compared with previous MRI from 08/06/2011.  Assessment/Plan: Encounter Diagnoses  Name Primary?   Nasal congestion Yes   Nonintractable episodic headache, unspecified headache type    Migraine without aura and without status migrainosus, not intractable    Environmental and  seasonal allergies    Otalgia of both ears    Bilateral temporomandibular joint pain    38 year old female with history of migraines currently followed by neurology for management, who is here for persistent ear discomfort; she also has history of asthma and uses albuterol as needed, she is followed by Allergy and has been on allergy shots for several years, already uses Singulair Xyzal and azelastine for nasal congestion, which I suspect is due to pre-existing history of environmental allergies.  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  I advised the patient to continue to follow with her allergy specialist and follow their recommendations related to allergy and asthma control.  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  She will continue to follow-up with neurology for management of headaches.  She will return to see me in 3 months for symptom check and imaging review.   - schedule CT sinuses  - continue nasal spray and Singulair/Xyzal and allergy shots - see you allergy provider to discuss current regimen and consider changing Xyzal to another medication  -  see Neurology for migraine headaches - take Motrin as needed for jaw related pain (and use soft diet)  - return in 3 months for sx check and to review imaging results  - come in sooner if you develop another episode of ear pain  - will consider nasal endoscopy when she returns to evaluate for nasal congestion/obstruction   Thank you for allowing me to participate in the care of this patient. Please do not hesitate to contact me with any questions or concerns.   Ashok Croon, MD Otolaryngology [facility] Phone: [phone] Fax: [phone]    06/24/2023, 8:54 AM

## 2023-06-24 NOTE — Patient Instructions (Addendum)
-   schedule CT sinuses  - continue nasal spray and Singulair/Xyzal and allergy shots - see you allergy provider to discuss current regimen and consider changing Xyzal to another medication  - see Neurology for migraine headaches - take Motrin as needed for jaw related pain (and use soft diet)  - return in 3 months for sx check and to review imaging results  - come in sooner if you develop another episode of ear pain

## 2023-07-14 ENCOUNTER — Ambulatory Visit: Payer: BC Managed Care – PPO | Admitting: Neurology

## 2023-07-16 ENCOUNTER — Ambulatory Visit (HOSPITAL_COMMUNITY): Payer: BC Managed Care – PPO

## 2023-07-16 ENCOUNTER — Encounter (HOSPITAL_COMMUNITY): Payer: Self-pay

## 2023-07-19 ENCOUNTER — Telehealth: Payer: Self-pay | Admitting: Neurology

## 2023-07-19 NOTE — Telephone Encounter (Signed)
LVM and sent MyChart message informing pt of r/s needed for 8/6 appt- NP out.

## 2023-07-20 ENCOUNTER — Ambulatory Visit: Payer: BC Managed Care – PPO | Admitting: Neurology

## 2023-08-06 ENCOUNTER — Ambulatory Visit (HOSPITAL_COMMUNITY): Payer: BC Managed Care – PPO

## 2023-08-27 ENCOUNTER — Telehealth: Payer: Self-pay

## 2023-08-27 NOTE — Telephone Encounter (Signed)
Patient spoke with [name] and advised she is holding off having the CT done until next year.

## 2023-08-27 NOTE — Telephone Encounter (Signed)
Called patient, LMVM that she had cancelled two follow up appointments with Dr. Irene Pap (10/11 and 10/14) and cancelled the CT scan as well that was scheduled on 08/23.  Would she still like to move forward and reschedule the CT scan.

## 2023-09-24 ENCOUNTER — Ambulatory Visit (INDEPENDENT_AMBULATORY_CARE_PROVIDER_SITE_OTHER): Payer: BC Managed Care – PPO | Admitting: Otolaryngology

## 2023-09-27 ENCOUNTER — Ambulatory Visit (INDEPENDENT_AMBULATORY_CARE_PROVIDER_SITE_OTHER): Payer: BC Managed Care – PPO | Admitting: Otolaryngology

## 2023-10-04 NOTE — Progress Notes (Addendum)
Virtual Visit via Video Note  I connected with [name] on 10/05/23 at  7:45 AM EDT by a video enabled telemedicine application and verified that I am speaking with the correct person using two identifiers.  Location: Patient: at her home Provider: in the office    I discussed the limitations of evaluation and management by telemedicine and the availability of in person appointments. The patient expressed understanding and agreed to proceed.  I discussed the assessment and treatment plan with the patient. The patient was provided an opportunity to ask questions and all were answered. The patient agreed with the plan and demonstrated an understanding of the instructions.   The patient was advised to call back or seek an in-person evaluation if the symptoms worsen or if the condition fails to improve as anticipated.  PATIENT: Anji Terman DOB: 02/04/1986  REASON FOR VISIT: Follow up for IIH HISTORY FROM: Patient PRIMARY NEUROLOGIST: Dr. Terrace Arabia  HISTORY  Christiann Sindlinger, is a 37 year old female, seen in request by ophthalmologist Dr. Dione Booze for evaluation of bilateral papillary edema, her primary care PA Alma Downs, [place], initial evaluation was on September 26, 2021   I reviewed and summarized the referring note. PMHx. Inflammatory arthritis, under the care of [facility] Dr. Aryl, taking Plaquenil Depression, anxiety.   Patient was diagnosed with inflammatory arthritis, taking Plaquenil, over the past few years, she was seen by Dr. Alden Hipp on a yearly basis, there was no significant abnormality find in the past, but at his most recent evaluation on July 14, 2021, posterior examination showed nasal disc edema bilaterally, mild blurring of margin, no disc pallor, CDR 0.1 left, 0.15 right eye. 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   She did  report rapid weight gain of 50 pounds over the past 2 years  She had long history of intermittent headache, used to only have couple times each month, since 2022, she has more frequent headaches, at least couple times each week, taking frequent ibuprofen with suboptimal response  She works at a desk job, complains of intermittent blurry vision, especially with sudden positional change, such as bending over,  Update December 09, 2021 SS: MRI of the brain without contrast in October 2022 showed no significant abnormality, there was evidence of 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  LP on 10/09/21 showed opening pressure of 40 cm water consistent with diagnosis of IIH.  Doing well on Topamax 200 mg at bedtime. Headaches are improved, less frequent, are 2-3 a month, less intense. Takes Imitrex, with good benefit, does make her nauseated. Saw Dr. Dione Booze in November, swelling was improved. Will follow up in 1 year with him. Has lost 15 lbs since October. Has been doing walking for exercise. Works 2 jobs, at [employer] and [employer], has 2 daughters (8 & 10).  Update 06/17/22 SS:  taking Topamax 200 mg at bedtime. Some family stress, no further weight loss. On the end of Ozempic. Some headaches with sinus/ear infections. Takes Imitrex for migraines, only 1 a week, with fast relief. Sees Dr. Dione Booze in November.   Update December 31, 2022 SS: Has lost 28 lbs since July. Exercising, watching her diet. Sinus issues. Remains on Topamax 200 mg at bedtime. Uses Imitrex PRN, takes 1 time a month. No vision changes. Has accepted new job at [employer] in [place]. Seen at [facility] on 11/09/22 by Alma Downs, PA.  The  fundus exam revealed no abnormalities in the macula.  Nasal disc edema decreased bilaterally.  Clear to continue Plaquenil.  Decreasing edema on eye exam.  Follow-up in 6 months.   Update October 05, 2023 SS: via VV, her car wouldn't start. Remains on Topamax 200 mg daily. Few headaches,  but mild, may take Imitrex once a month. Under stress with job. Weight has increased 8 lbs. Getting back to working out. No vision changes. Going back to Dr. Dione Booze in November.   REVIEW OF SYSTEMS: Out of a complete 14 system review of symptoms, the patient complains only of the following symptoms, and all other reviewed systems are negative.  See HPI  ALLERGIES: Allergies  Allergen Reactions   Cephalosporins Nausea And Vomiting and Rash   Ondansetron Nausea And Vomiting and Other (See Comments)    Chest pain and nose bleeds    Zofran Nausea And Vomiting and Other (See Comments)    Chest pain and nose bleeds   Sulfamethoxazole-Trimethoprim     HOME MEDICATIONS: Outpatient Medications Prior to Visit  Medication Sig Dispense Refill   albuterol (VENTOLIN HFA) 108 (90 Base) MCG/ACT inhaler Inhale 2 puffs into the lungs every 6 (six) hours as needed.     azelastine (ASTELIN) 0.1 % nasal spray SPRAY 1 SPRAY INTO EACH NOSTRIL TWICE A DAY     EPINEPHrine 0.3 mg/0.3 mL IJ SOAJ injection SMARTSIG:0.3 Milliliter(s) IM Once PRN     famotidine (PEPCID) 40 MG tablet Take 40 mg by mouth 2 (two) times daily.     fluticasone (FLONASE) 50 MCG/ACT nasal spray USE 1 2 SPRAYS IN EACH NOSTRIL ONCE A DAY     hydroxychloroquine (PLAQUENIL) 200 MG tablet 2 TABLETS WITH FOOD OR MILK ONCE A DAY  2   ibuprofen (ADVIL) 600 MG tablet Take 1 tablet (600 mg total) by mouth every 8 (eight) hours as needed. 30 tablet 0   levocetirizine (XYZAL) 5 MG tablet Take 5 mg by mouth every evening.     levonorgestrel (MIRENA) 20 MCG/24HR IUD 1 each by Intrauterine route once. Inserted 01/19/2018, needs to be removed 01/2023.     montelukast (SINGULAIR) 10 MG tablet Take 10 mg by mouth daily.  1   SUMAtriptan (IMITREX) 50 MG tablet Take 1 tablet (50 mg total) by mouth every 2 (two) hours as needed for migraine. May repeat in 2 hours if headache persists or recurs. 12 tablet 6   topiramate (TOPAMAX) 100 MG tablet Take 1 tablet (100  mg total) by mouth 2 (two) times daily. 180 tablet 3   No facility-administered medications prior to visit.    PAST MEDICAL HISTORY: Past Medical History:  Diagnosis Date   Acid reflux    Allergy    Anxiety    Depression    Hyperlipidemia    Inflammatory arthritis 02/18/2018    PAST SURGICAL HISTORY: Past Surgical History:  Procedure Laterality Date   PERIPHERALLY INSERTED CENTRAL CATHETER INSERTION     WISDOM TOOTH EXTRACTION     WISDOM TOOTH EXTRACTION Bilateral     FAMILY HISTORY: Family History  Problem Relation Age of Onset   Hypertension Mother    Hyperlipidemia Mother    Arthritis Mother    Depression Sister    Heart disease Maternal Grandmother    Heart disease Maternal Grandfather    Heart disease Paternal Grandmother    Heart disease Paternal Grandfather    Diabetes Paternal Aunt    Diabetes Paternal Uncle    Breast cancer Neg Hx  Colon cancer Neg Hx     SOCIAL HISTORY: Social History   Socioeconomic History   Marital status: Married    Spouse name: Not on file   Number of children: Not on file   Years of education: Not on file   Highest education level: Master's degree (e.g., MA, MS, MEng, MEd, MSW, MBA)  Occupational History   Not on file  Tobacco Use   Smoking status: Never   Smokeless tobacco: Never  Vaping Use   Vaping status: Never Used  Substance and Sexual Activity   Alcohol use: Yes    Comment: very rare   Drug use: No   Sexual activity: Yes    Birth control/protection: I.U.D., None  Other Topics Concern   Not on file  Social History Narrative   Right handed   Coffee- 2 cups per week   Married with 2 kids -- daughters   Works at [employer] and [employer] (2nd shift)   Live in [place]   Social Determinants of Health   Financial Resource Strain: Low Risk  (06/02/2023)   Overall Financial Resource Strain (CARDIA)    Difficulty of Paying Living Expenses: Not very hard  Food Insecurity: No Food Insecurity (06/02/2023)   Hunger Vital  Sign    Worried About Running Out of Food in the Last Year: Never true    Ran Out of Food in the Last Year: Never true  Transportation Needs: No Transportation Needs (06/02/2023)   PRAPARE - Administrator, Civil Service (Medical): No    Lack of Transportation (Non-Medical): No  Physical Activity: Insufficiently Active (06/02/2023)   Exercise Vital Sign    Days of Exercise per Week: 3 days    Minutes of Exercise per Session: 30 min  Stress: No Stress Concern Present (06/02/2023)   [employer] of Occupational Health - Occupational Stress Questionnaire    Feeling of Stress : Only a little  Social Connections: Moderately Integrated (06/02/2023)   Social Connection and Isolation Panel [NHANES]    Frequency of Communication with Friends and Family: More than three times a week    Frequency of Social Gatherings with Friends and Family: Twice a week    Attends Religious Services: 1 to 4 times per year    Active Member of [employer] or Organizations: No    Attends Engineer, structural: Not on file    Marital Status: Married  Catering manager Violence: Not on file   PHYSICAL EXAM  Via video visit, is alert and oriented, speech is clear and concise, moves about freely all extremities.  DIAGNOSTIC DATA (LABS, IMAGING, TESTING) - I reviewed patient records, labs, notes, testing and imaging myself where available.  Lab Results  Component Value Date   WBC 9.4 06/15/2023   HGB 14.2 06/15/2023   HCT 43.1 06/15/2023   MCV 100.7 (H) 06/15/2023   PLT 345.0 06/15/2023      Component Value Date/Time   NA 136 06/15/2023 1414   NA 137 11/13/2022 0000   K 3.9 06/15/2023 1414   CL 108 06/15/2023 1414   CO2 21 06/15/2023 1414   GLUCOSE 79 06/15/2023 1414   BUN 12 06/15/2023 1414   BUN 12 11/13/2022 0000   CREATININE 0.82 06/15/2023 1414   CALCIUM 9.5 06/15/2023 1414   PROT 7.5 06/15/2023 1414   ALBUMIN 4.5 06/15/2023 1414   ALBUMIN 5.0 10/09/2021 0901   AST 15 06/15/2023  1414   ALT 26 06/15/2023 1414   ALKPHOS 53 06/15/2023 1414  BILITOT 0.6 06/15/2023 1414   GFRNONAA 75.89 01/23/2020 0000   GFRAA 91.83 01/23/2020 0000   Lab Results  Component Value Date   CHOL 210 (H) 12/09/2022   HDL 54.90 12/09/2022   LDLCALC 142 (H) 12/09/2022   TRIG 63.0 12/09/2022   CHOLHDL 4 12/09/2022   Lab Results  Component Value Date   HGBA1C 5.0 12/09/2022   No results found for: "VITAMINB12" Lab Results  Component Value Date   TSH 3.02 06/15/2023   ASSESSMENT AND PLAN 37 y.o. year old female :  1.  Intracranial hypertension 2.  Bilateral papilledema 3.  Obesity 4.  Frequent headaches with migraine features  -Continues to do well, we discussed the importance of weight loss, she remains dedicated to her weight loss journey -Continue Topamax 200 mg daily, Imitrex as needed for acute headache -Continue every 76-month follow-up with ophthalmology, appointment in November -Follow-up in our office in 8 months or sooner if needed  Meds ordered this encounter  Medications   topiramate (TOPAMAX) 100 MG tablet    Sig: Take 1 tablet (100 mg total) by mouth 2 (two) times daily.    Dispense:  180 tablet    Refill:  3   SUMAtriptan (IMITREX) 50 MG tablet    Sig: Take 1 tablet (50 mg total) by mouth every 2 (two) hours as needed for migraine. May repeat in 2 hours if headache persists or recurs.    Dispense:  12 tablet    Refill:  6    -Seen at [facility] on 11/09/22 by Alma Downs, PA.  The fundus exam revealed no abnormalities in the macula.  Nasal disc edema decreased bilaterally.  Clear to continue Plaquenil.  Decreasing edema on eye exam.  Follow-up in 6 months. 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 -LP on 10/09/21 showed opening pressure of 40 cm water consistent with diagnosis of IIH  Addendum 05/18/23 SS: Was seen at [facility] eye care  05/14/2023 RNFL stable, continue topiramate per neurologist.  Cleared to continue Plaquenil.  Decreasing optic nerve edema on OCT.  Follow-up in 6 months.  Addendum 11/16/23 SS: Seen at [facility] eye care 11/10/2023.  Cleared to continue Plaquenil.  Bilateral nasal disc edema decreased/distinct.  No disc pallor..  Decreasing edema on OCT.  Margie Ege, AGNP-C, DNP 10/04/2023, 9:15 PM [facility] [address] [place], [place] [address] [phone]

## 2023-10-05 ENCOUNTER — Telehealth: Payer: BC Managed Care – PPO | Admitting: Neurology

## 2023-10-05 ENCOUNTER — Encounter: Payer: Self-pay | Admitting: Neurology

## 2023-10-05 DIAGNOSIS — G932 Benign intracranial hypertension: Secondary | ICD-10-CM | POA: Diagnosis not present

## 2023-10-05 DIAGNOSIS — E669 Obesity, unspecified: Secondary | ICD-10-CM | POA: Diagnosis not present

## 2023-10-05 DIAGNOSIS — G43701 Chronic migraine without aura, not intractable, with status migrainosus: Secondary | ICD-10-CM | POA: Insufficient documentation

## 2023-10-05 MED ORDER — TOPIRAMATE 100 MG PO TABS: 100.0000 mg | ORAL_TABLET | Freq: Two times a day (BID) | ORAL | 3 refills | Status: DC

## 2023-10-05 MED ORDER — SUMATRIPTAN SUCCINATE 50 MG PO TABS: 50.0000 mg | ORAL_TABLET | ORAL | 6 refills | Status: DC | PRN

## 2023-10-05 NOTE — Patient Instructions (Signed)
Continue to work on weight loss.  Keep appoint with ophthalmology in November.  Will continue current medications.  Please let me know if your headaches increase, or if any vision changes occur.  I will see you back in 8 months.  Thanks!!

## 2023-11-17 LAB — CBC AND DIFFERENTIAL
HCT: 43 (ref 36–46)
Hemoglobin: 14.4 (ref 12.0–16.0)
Neutrophils Absolute: 4.5
Platelets: 348 10*3/uL (ref 150–400)
WBC: 8

## 2023-11-17 LAB — HEPATIC FUNCTION PANEL
ALT: 31 U/L (ref 7–35)
AST: 20 (ref 13–35)
Alkaline Phosphatase: 65 (ref 25–125)
Bilirubin, Total: 0.5

## 2023-11-17 LAB — BASIC METABOLIC PANEL WITH GFR
BUN: 11 (ref 4–21)
CO2: 19 (ref 13–22)
Chloride: 109 — AB (ref 99–108)
Creatinine: 0.8 (ref 0.5–1.1)
Glucose: 82
Potassium: 4.3 meq/L (ref 3.5–5.1)
Sodium: 140 (ref 137–147)

## 2023-11-17 LAB — COMPREHENSIVE METABOLIC PANEL WITH GFR
Albumin: 4.4 (ref 3.5–5.0)
Calcium: 9.3 (ref 8.7–10.7)
Globulin: 3
eGFR: 94

## 2023-11-17 LAB — POCT ERYTHROCYTE SEDIMENTATION RATE, NON-AUTOMATED: Sed Rate: 13

## 2023-11-17 LAB — CBC: RBC: 4.36 (ref 3.87–5.11)

## 2023-11-24 ENCOUNTER — Encounter: Payer: Self-pay | Admitting: Physician Assistant

## 2023-12-06 ENCOUNTER — Ambulatory Visit: Payer: BC Managed Care – PPO | Admitting: Physician Assistant

## 2023-12-06 VITALS — BP 120/72 | HR 76 | Temp 97.8°F | Ht 64.0 in | Wt 239.0 lb

## 2023-12-06 DIAGNOSIS — J019 Acute sinusitis, unspecified: Secondary | ICD-10-CM | POA: Diagnosis not present

## 2023-12-06 DIAGNOSIS — E669 Obesity, unspecified: Secondary | ICD-10-CM

## 2023-12-06 DIAGNOSIS — B9689 Other specified bacterial agents as the cause of diseases classified elsewhere: Secondary | ICD-10-CM | POA: Diagnosis not present

## 2023-12-06 DIAGNOSIS — Z23 Encounter for immunization: Secondary | ICD-10-CM | POA: Diagnosis not present

## 2023-12-06 MED ORDER — DOXYCYCLINE HYCLATE 100 MG PO TABS: 100.0000 mg | ORAL_TABLET | Freq: Two times a day (BID) | ORAL | 0 refills | Status: DC

## 2023-12-06 MED ORDER — PHENTERMINE HCL 37.5 MG PO TABS: 18.7500 mg | ORAL_TABLET | Freq: Every day | ORAL | 0 refills | Status: DC

## 2023-12-06 NOTE — Progress Notes (Signed)
[name] is a 37 y.o. female here for a follow up of a pre-existing problem.  History of Present Illness:   Chief Complaint  Patient presents with   Obesity    Pt would like to discuss weight loss medications.    HPI  Obesity Pt is interested in starting weight loss injectables [facility]) to manage her weight.  States her current insurance does not cover these injectables, but she will get a new insurance in the new year.  Was previously on Ozempic, tolerated well. Also previously took phentermine without negative side effects No hx of gestational diabetes or PCOS.  She is exercising several days a week -- walking and eating low calorie diet (1500ish) calories  Sinus infection: Pt continues to experience sinus issues and intermittent ear pain.  Was seen by ENT and also follows up with her allergist. Current regimen consists of Flonase, Singular 10 mg, and Xyzal 5 mg. Endorses fever and chills at some point, which she attributes to her "cold"   Past Medical History:  Diagnosis Date   Acid reflux    Allergy    Anxiety    Depression    Hyperlipidemia    Inflammatory arthritis 02/18/2018     Social History   Tobacco Use   Smoking status: Never   Smokeless tobacco: Never  Vaping Use   Vaping status: Never Used  Substance Use Topics   Alcohol use: Yes    Comment: very rare   Drug use: No    Past Surgical History:  Procedure Laterality Date   PERIPHERALLY INSERTED CENTRAL CATHETER INSERTION     WISDOM TOOTH EXTRACTION     WISDOM TOOTH EXTRACTION Bilateral     Family History  Problem Relation Age of Onset   Hypertension Mother    Hyperlipidemia Mother    Arthritis Mother    Depression Sister    Heart disease Maternal Grandmother    Heart disease Maternal Grandfather    Heart disease Paternal Grandmother    Heart disease Paternal Grandfather    Diabetes Paternal Aunt    Diabetes Paternal Uncle    Breast cancer Neg Hx    Colon cancer Neg Hx      Allergies  Allergen Reactions   Cephalosporins Nausea And Vomiting and Rash   Ondansetron Nausea And Vomiting and Other (See Comments)    Chest pain and nose bleeds    Zofran Nausea And Vomiting and Other (See Comments)    Chest pain and nose bleeds   Sulfamethoxazole-Trimethoprim     Current Medications:   Current Outpatient Medications:    albuterol (VENTOLIN HFA) 108 (90 Base) MCG/ACT inhaler, Inhale 2 puffs into the lungs every 6 (six) hours as needed., Disp: , Rfl:    azelastine (ASTELIN) 0.1 % nasal spray, SPRAY 1 SPRAY INTO EACH NOSTRIL TWICE A DAY, Disp: , Rfl:    doxycycline (VIBRA-TABS) 100 MG tablet, Take 1 tablet (100 mg total) by mouth 2 (two) times daily., Disp: 14 tablet, Rfl: 0   EPINEPHrine 0.3 mg/0.3 mL IJ SOAJ injection, SMARTSIG:0.3 Milliliter(s) IM Once PRN, Disp: , Rfl:    famotidine (PEPCID) 40 MG tablet, Take 40 mg by mouth 2 (two) times daily., Disp: , Rfl:    fluticasone (FLONASE) 50 MCG/ACT nasal spray, USE 1 2 SPRAYS IN EACH NOSTRIL ONCE A DAY, Disp: , Rfl:    hydroxychloroquine (PLAQUENIL) 200 MG tablet, 2 TABLETS WITH FOOD OR MILK ONCE A DAY, Disp: , Rfl: 2   ibuprofen (ADVIL) 600 MG tablet, Take  1 tablet (600 mg total) by mouth every 8 (eight) hours as needed., Disp: 30 tablet, Rfl: 0   levocetirizine (XYZAL) 5 MG tablet, Take 5 mg by mouth every evening., Disp: , Rfl:    levonorgestrel (MIRENA) 20 MCG/24HR IUD, 1 each by Intrauterine route once. Inserted 01/19/2018, needs to be removed 01/2023., Disp: , Rfl:    montelukast (SINGULAIR) 10 MG tablet, Take 10 mg by mouth daily., Disp: , Rfl: 1   phentermine (ADIPEX-P) 37.5 MG tablet, Take 0.5-1 tablets (18.75-37.5 mg total) by mouth daily before breakfast., Disp: 30 tablet, Rfl: 0   SUMAtriptan (IMITREX) 50 MG tablet, Take 1 tablet (50 mg total) by mouth every 2 (two) hours as needed for migraine. May repeat in 2 hours if headache persists or recurs., Disp: 12 tablet, Rfl: 6   topiramate (TOPAMAX) 100 MG  tablet, Take 1 tablet (100 mg total) by mouth 2 (two) times daily., Disp: 180 tablet, Rfl: 3   Review of Systems:   ROS Negative unless otherwise specified per HPI.  Vitals:   Vitals:   12/06/23 1046  BP: 120/72  Pulse: 76  Temp: 97.8 F (36.6 C)  TempSrc: Temporal  SpO2: 98%  Weight: 239 lb (108.4 kg)  Height: 5\' 4"  (1.626 m)     Body mass index is 41.02 kg/m.  Physical Exam:   Physical Exam Vitals and nursing note reviewed.  Constitutional:      General: She is not in acute distress.    Appearance: She is well-developed. She is not ill-appearing or toxic-appearing.  HENT:     Head: Normocephalic and atraumatic.     Right Ear: Ear canal and external ear normal. Tympanic membrane is erythematous. Tympanic membrane is not retracted or bulging.     Left Ear: Ear canal and external ear normal. Tympanic membrane is erythematous. Tympanic membrane is not retracted or bulging.     Nose: Nose normal.     Right Sinus: No maxillary sinus tenderness or frontal sinus tenderness.     Left Sinus: No maxillary sinus tenderness or frontal sinus tenderness.     Mouth/Throat:     Pharynx: Uvula midline. No posterior oropharyngeal erythema.  Eyes:     General: Lids are normal.     Conjunctiva/sclera: Conjunctivae normal.  Neck:     Trachea: Trachea normal.  Cardiovascular:     Rate and Rhythm: Normal rate and regular rhythm.     Heart sounds: Normal heart sounds, S1 normal and S2 normal.  Pulmonary:     Effort: Pulmonary effort is normal.     Breath sounds: Normal breath sounds. No decreased breath sounds, wheezing, rhonchi or rales.  Lymphadenopathy:     Cervical: No cervical adenopathy.  Skin:    General: Skin is warm and dry.  Neurological:     Mental Status: She is alert.  Psychiatric:        Speech: Speech normal.        Behavior: Behavior normal. Behavior is cooperative.     Assessment and Plan:   Acute bacterial sinusitis No red flags on exam.   Will initiate  doxycycline per orders.  Discussed taking medications as prescribed.  Reviewed return precautions including new or worsening fever, SOB, new or worsening cough or other concerns.  Push fluids and rest.  I recommend that patient follow-up if symptoms worsen or persist despite treatment x 7-10 days, sooner if needed.  Need for prophylactic vaccination with combined diphtheria-tetanus-pertussis (DTP) vaccine Completed today  Obesity, unspecified class, unspecified  obesity type, unspecified whether serious comorbidity present We will try to send in [facility] on Jan 1 - she will message [place] Continue healthy eating and exercising Will send in temporary refill of phentermine - risks/benefits/side effect(s) discussed Follow-up in 1 month for Comprehensive Physical Exam (CPE) preventive care annual visit and discuss further    I, Isabelle Course, acting as a Neurosurgeon for Jarold Motto, [place]., have documented all relevant documentation on the behalf of Jarold Motto, [place], as directed by  Jarold Motto, PA while in the presence of Jarold Motto, [place].  I, Jarold Motto, [place], have reviewed all documentation for this visit. The documentation on 12/06/23 for the exam, diagnosis, procedures, and orders are all accurate and complete.   Jarold Motto, PA-C

## 2023-12-15 ENCOUNTER — Encounter: Payer: Self-pay | Admitting: Physician Assistant

## 2023-12-16 MED ORDER — WEGOVY 0.25 MG/0.5ML ~~LOC~~ SOAJ: 0.2500 mg | SUBCUTANEOUS | 0 refills | Status: DC

## 2023-12-16 NOTE — Telephone Encounter (Signed)
Patients insurance has been added. ?

## 2023-12-16 NOTE — Telephone Encounter (Signed)
 Please add patients new insurance in chart. When finished return to me so I can send in her prescription. Thanks

## 2023-12-31 ENCOUNTER — Encounter: Payer: Self-pay | Admitting: Physician Assistant

## 2023-12-31 ENCOUNTER — Ambulatory Visit (INDEPENDENT_AMBULATORY_CARE_PROVIDER_SITE_OTHER): Payer: 59 | Admitting: Physician Assistant

## 2023-12-31 VITALS — BP 106/70 | HR 95 | Temp 97.6°F | Ht 64.0 in | Wt 238.2 lb

## 2023-12-31 DIAGNOSIS — M1990 Unspecified osteoarthritis, unspecified site: Secondary | ICD-10-CM

## 2023-12-31 DIAGNOSIS — F419 Anxiety disorder, unspecified: Secondary | ICD-10-CM | POA: Diagnosis not present

## 2023-12-31 DIAGNOSIS — Z0000 Encounter for general adult medical examination without abnormal findings: Secondary | ICD-10-CM | POA: Diagnosis not present

## 2023-12-31 DIAGNOSIS — F330 Major depressive disorder, recurrent, mild: Secondary | ICD-10-CM

## 2023-12-31 DIAGNOSIS — E669 Obesity, unspecified: Secondary | ICD-10-CM | POA: Diagnosis not present

## 2023-12-31 DIAGNOSIS — G43701 Chronic migraine without aura, not intractable, with status migrainosus: Secondary | ICD-10-CM

## 2023-12-31 LAB — LIPID PANEL
Cholesterol: 237 mg/dL — ABNORMAL HIGH (ref 0–200)
HDL: 49.4 mg/dL (ref >39.00)
LDL Cholesterol: 175 mg/dL — ABNORMAL HIGH (ref 0–99)
NonHDL: 187.81
Total CHOL/HDL Ratio: 5
Triglycerides: 63 mg/dL (ref 0.0–149.0)
VLDL: 12.6 mg/dL (ref 0.0–40.0)

## 2023-12-31 LAB — COMPREHENSIVE METABOLIC PANEL
ALT: 24 U/L (ref 0–35)
AST: 20 U/L (ref 0–37)
Albumin: 4.4 g/dL (ref 3.5–5.2)
Alkaline Phosphatase: 58 U/L (ref 39–117)
BUN: 10 mg/dL (ref 6–23)
CO2: 19 meq/L (ref 19–32)
Calcium: 9.2 mg/dL (ref 8.4–10.5)
Chloride: 107 meq/L (ref 96–112)
Creatinine, Ser: 0.77 mg/dL (ref 0.40–1.20)
GFR: 98.06 mL/min (ref >60.00)
Glucose, Bld: 75 mg/dL (ref 70–99)
Potassium: 3.6 meq/L (ref 3.5–5.1)
Sodium: 134 meq/L — ABNORMAL LOW (ref 135–145)
Total Bilirubin: 0.6 mg/dL (ref 0.2–1.2)
Total Protein: 7.6 g/dL (ref 6.0–8.3)

## 2023-12-31 LAB — CBC WITH DIFFERENTIAL/PLATELET
Basophils Absolute: 0 10*3/uL (ref 0.0–0.1)
Basophils Relative: 0.5 % (ref 0.0–3.0)
Eosinophils Absolute: 0.2 10*3/uL (ref 0.0–0.7)
Eosinophils Relative: 2.3 % (ref 0.0–5.0)
HCT: 41.8 % (ref 36.0–46.0)
Hemoglobin: 14.1 g/dL (ref 12.0–15.0)
Lymphocytes Relative: 30.1 % (ref 12.0–46.0)
Lymphs Abs: 2.6 10*3/uL (ref 0.7–4.0)
MCHC: 33.8 g/dL (ref 30.0–36.0)
MCV: 100 fL (ref 78.0–100.0)
Monocytes Absolute: 0.7 10*3/uL (ref 0.1–1.0)
Monocytes Relative: 8 % (ref 3.0–12.0)
Neutro Abs: 5.1 10*3/uL (ref 1.4–7.7)
Neutrophils Relative %: 59.1 % (ref 43.0–77.0)
Platelets: 324 10*3/uL (ref 150.0–400.0)
RBC: 4.18 Mil/uL (ref 3.87–5.11)
RDW: 13.2 % (ref 11.5–15.5)
WBC: 8.6 10*3/uL (ref 4.0–10.5)

## 2023-12-31 MED ORDER — TIRZEPATIDE-WEIGHT MANAGEMENT 2.5 MG/0.5ML ~~LOC~~ SOLN: 2.5000 mg | SUBCUTANEOUS | 2 refills | Status: DC

## 2023-12-31 NOTE — Progress Notes (Signed)
Subjective:    [name] is a 38 y.o. female and is here for a comprehensive physical exam.  HPI  Acute Concerns: None.   Chronic Issues: Obesity: Restarted phentermine 18.75-37.5 mg once daily since her last visit on 12/06/23.  Endorses some "cold chills", otherwise tolerating well and no other side effects.  She has been working on eating healthier and walking more.  Plans to increase her exercise soon by using her elliptical and will soon start using an exercise book as a guide.  Pt reports her new insurance will cover Wegovy or any other weight loss medications.  Has been drinking plenty of water.   Chronic migraine UpToDate with care with neurology Takes 100 mg topiramate twice daily and Imitrex as needed Denies major concerns Would like to lose weight to help with her intracranial hypertension   Inflammatory arthritis UpToDate with care with rheumatology Continue hydroxychloroquine   Health Maintenance: Immunizations -- UTD Colonoscopy -- N/a Mammogram -- N/a PAP -- UTD, last done 11/09/22. Results were normal.  Bone Density -- N/a Diet -- Working on eating healthier.  Exercise -- Walking. Plans to start using the elliptical and using a book as a guide for exercising.  Sleep habits -- Stays up late watching TV. Mood -- Stable.   UTD with dentist? - yes UTD with eye doctor? - yes  Weight history: Wt Readings from Last 10 Encounters:  12/31/23 238 lb 4 oz (108.1 kg)  12/06/23 239 lb (108.4 kg)  06/15/23 233 lb 4 oz (105.8 kg)  06/03/23 231 lb 12.8 oz (105.1 kg)  01/26/23 222 lb 4 oz (100.8 kg)  12/31/22 219 lb (99.3 kg)  12/09/22 225 lb (102.1 kg)  09/08/22 239 lb 6 oz (108.6 kg)  08/14/22 242 lb (109.8 kg)  06/17/22 247 lb 2 oz (112.1 kg)   Body mass index is 40.9 kg/m. No LMP recorded. (Menstrual status: IUD).  Alcohol use:  reports current alcohol use.  Tobacco use:  Tobacco Use: Low Risk  (12/31/2023)   Patient History    Smoking  Tobacco Use: Never    Smokeless Tobacco Use: Never    Passive Exposure: Not on file   Eligible for lung cancer screening? UpToDate      12/31/2023    1:06 PM  Depression screen PHQ 2/9  Decreased Interest 0  Down, Depressed, Hopeless 0  PHQ - 2 Score 0  Altered sleeping 0  Tired, decreased energy 0  Change in appetite 0  Feeling bad or failure about yourself  0  Trouble concentrating 0  Moving slowly or fidgety/restless 0  Suicidal thoughts 0  PHQ-9 Score 0  Difficult doing work/chores Not difficult at all     Other providers/specialists: Patient Care Team: Jarold Motto, [place] as PCP - General (Physician Assistant) Lavina Hamman, MD as Consulting Physician (Obstetrics and Gynecology) Casimer Lanius, MD as Consulting Physician (Rheumatology)    PMHx, SurgHx, SocialHx, Medications, and Allergies were reviewed in the Visit Navigator and updated as appropriate.   Past Medical History:  Diagnosis Date   Acid reflux    Allergy    Anxiety    Depression    Hyperlipidemia    Inflammatory arthritis 02/18/2018     Past Surgical History:  Procedure Laterality Date   PERIPHERALLY INSERTED CENTRAL CATHETER INSERTION     WISDOM TOOTH EXTRACTION     WISDOM TOOTH EXTRACTION Bilateral      Family History  Problem Relation Age of Onset   Hypertension Mother  Hyperlipidemia Mother    Arthritis Mother    Bipolar disorder Sister    Suicidality Sister    Heart disease Maternal Grandmother    Heart disease Maternal Grandfather    Heart disease Paternal Grandmother    Heart disease Paternal Grandfather    Diabetes Paternal Aunt    Diabetes Paternal Uncle    Breast cancer Neg Hx    Colon cancer Neg Hx     Social History   Tobacco Use   Smoking status: Never   Smokeless tobacco: Never  Vaping Use   Vaping status: Never Used  Substance Use Topics   Alcohol use: Yes    Comment: very rare   Drug use: No    Review of Systems:   Review of Systems   Constitutional:  Negative for chills, fever, malaise/fatigue and weight loss.  HENT:  Negative for hearing loss, sinus pain and sore throat.   Respiratory:  Negative for cough and hemoptysis.   Cardiovascular:  Negative for chest pain, palpitations, leg swelling and PND.  Gastrointestinal:  Negative for abdominal pain, constipation, diarrhea, heartburn, nausea and vomiting.  Genitourinary:  Negative for dysuria, frequency and urgency.  Musculoskeletal:  Negative for back pain, myalgias and neck pain.  Skin:  Negative for itching and rash.  Neurological:  Negative for dizziness, tingling, seizures and headaches.  Endo/Heme/Allergies:  Negative for polydipsia.  Psychiatric/Behavioral:  Negative for depression. The patient is not nervous/anxious.     Objective:   BP 106/70 (BP Location: Right Arm, Patient Position: Sitting, Cuff Size: Large)   Pulse 95   Temp 97.6 F (36.4 C) (Temporal)   Ht 5\' 4"  (1.626 m)   Wt 238 lb 4 oz (108.1 kg)   SpO2 97%   BMI 40.90 kg/m  Body mass index is 40.9 kg/m.   General Appearance:    Alert, cooperative, no distress, appears stated age  Head:    Normocephalic, without obvious abnormality, atraumatic  Eyes:    PERRL, conjunctiva/corneas clear, EOM's intact, fundi    benign, both eyes  Ears:    Normal TM's and external ear canals, both ears  Nose:   Nares normal, septum midline, mucosa normal, no drainage    or sinus tenderness  Throat:   Lips, mucosa, and tongue normal; teeth and gums normal  Neck:   Supple, symmetrical, trachea midline, no adenopathy;    thyroid:  no enlargement/tenderness/nodules; no carotid   bruit or JVD  Back:     Symmetric, no curvature, ROM normal, no CVA tenderness  Lungs:     Clear to auscultation bilaterally, respirations unlabored  Chest Wall:    No tenderness or deformity   Heart:    Regular rate and rhythm, S1 and S2 normal, no murmur, rub or gallop  Breast Exam:    Deferred  Abdomen:     Soft, non-tender, bowel  sounds active all four quadrants,    no masses, no organomegaly  Genitalia:    Deferred   Extremities:   Extremities normal, atraumatic, no cyanosis or edema  Pulses:   2+ and symmetric all extremities  Skin:   Skin color, texture, turgor normal, no rashes or lesions  Lymph nodes:   Cervical, supraclavicular, and axillary nodes normal  Neurologic:   CNII-XII intact, normal strength, sensation and reflexes    throughout    Assessment/Plan:   Routine physical examination Today patient counseled on age appropriate routine health concerns for screening and prevention, each reviewed and up to date or declined. Immunizations reviewed  and up to date or declined. Labs ordered and reviewed. Risk factors for depression reviewed and negative. Hearing function and visual acuity are intact. ADLs screened and addressed as needed. Functional ability and level of safety reviewed and appropriate. Education, counseling and referrals performed based on assessed risks today. Patient provided with a copy of personalized plan for preventive services.  Obesity, unspecified class, unspecified obesity type, unspecified whether serious comorbidity present Continue efforts at weight loss Stop phentermine Start Zepbound 2.5 mg weekly vials -- risks and benefits/side effect(s) discussed Follow up in 3 month(s), sooner if concerns  Inflammatory arthritis Well controlled per patient Management per rheum   Anxiety; Mild episode of recurrent major depressive disorder (HCC) Well controlled Denies concerns  Chronic migraine w/o aura, not intractable, w stat migr Well controlled per patient Management per neurology    I, Isabelle Course, acting as a scribe for Jarold Motto, [place]., have documented all relevant documentation on the behalf of Jarold Motto, [place], as directed by  Jarold Motto, PA while in the presence of Jarold Motto, [place].  I, Isabelle Course, have reviewed all documentation for this visit. The  documentation on 12/31/23 for the exam, diagnosis, procedures, and orders are all accurate and complete.  Jarold Motto, PA-C [facility]

## 2023-12-31 NOTE — Patient Instructions (Addendum)
It was great to see you!  Clean off the elliptical  Please go to the lab for blood work.   Our office will call you with your results unless you have chosen to receive results via MyChart.  If your blood work is normal we will follow-up each year for physicals and as scheduled for chronic medical problems.  If anything is abnormal we will treat accordingly and get you in for a follow-up.  Take care,  Lelon Mast

## 2024-01-03 ENCOUNTER — Encounter: Payer: Self-pay | Admitting: Physician Assistant

## 2024-01-26 ENCOUNTER — Ambulatory Visit: Payer: BC Managed Care – PPO | Admitting: Neurology

## 2024-03-07 ENCOUNTER — Other Ambulatory Visit: Payer: Self-pay | Admitting: Physician Assistant

## 2024-03-07 MED ORDER — TIRZEPATIDE-WEIGHT MANAGEMENT 5 MG/0.5ML ~~LOC~~ SOLN: 5.0000 mg | SUBCUTANEOUS | 0 refills | Status: DC

## 2024-04-04 ENCOUNTER — Ambulatory Visit: Payer: 59 | Admitting: Physician Assistant

## 2024-04-04 ENCOUNTER — Encounter: Payer: Self-pay | Admitting: Physician Assistant

## 2024-04-04 VITALS — BP 110/70 | HR 78 | Temp 97.2°F | Ht 64.0 in | Wt 232.4 lb

## 2024-04-04 DIAGNOSIS — E669 Obesity, unspecified: Secondary | ICD-10-CM

## 2024-04-04 MED ORDER — ZEPBOUND 2.5 MG/0.5ML ~~LOC~~ SOAJ: 2.5000 mg | SUBCUTANEOUS | 1 refills | Status: DC

## 2024-04-04 NOTE — Progress Notes (Signed)
 [name] is a 38 y.o. female here for a follow up of a pre-existing problem.  History of Present Illness:   Chief Complaint  Patient presents with   Obesity    Pt is currently on Zepbound  2.5 mg weekly.   Obesity / Weight management: At her last visit on 12/31/23 she was started on Zepbound  2.5 mg once weekly and discontinued Phentermine .  Her weight has recently started to fluctuate a bit, but she has noticed weight loss.  She is walking more frequently while on her lunch break and has made changes to her eating habits.  She reports a decrease in hunger and food noise.  She has also had a decrease in appetite and at nighttime she has not been eating as much.   She complains of a lower abdominal rash near her injection sites.  She tends to change sides between injections.  Denies any associated pain or itchiness.  She is otherwise tolerating Zepbound  well.  Denies any nausea, constipation, or other GI upset/side effects.   Past Medical History:  Diagnosis Date   Acid reflux    Allergy    Anxiety    Depression    Hyperlipidemia    Inflammatory arthritis 02/18/2018     Social History   Tobacco Use   Smoking status: Never   Smokeless tobacco: Never  Vaping Use   Vaping status: Never Used  Substance Use Topics   Alcohol use: Yes    Comment: very rare   Drug use: No    Past Surgical History:  Procedure Laterality Date   PERIPHERALLY INSERTED CENTRAL CATHETER INSERTION     WISDOM TOOTH EXTRACTION     WISDOM TOOTH EXTRACTION Bilateral     Family History  Problem Relation Age of Onset   Hypertension Mother    Hyperlipidemia Mother    Arthritis Mother    Bipolar disorder Sister    Suicidality Sister    Heart disease Maternal Grandmother    Heart disease Maternal Grandfather    Heart disease Paternal Grandmother    Heart disease Paternal Grandfather    Diabetes Paternal Aunt    Diabetes Paternal Uncle    Breast cancer Neg Hx    Colon cancer Neg Hx      Allergies  Allergen Reactions   Cephalosporins Nausea And Vomiting and Rash   Ondansetron  Nausea And Vomiting and Other (See Comments)    Chest pain and nose bleeds    Zofran  Nausea And Vomiting and Other (See Comments)    Chest pain and nose bleeds   Sulfamethoxazole -Trimethoprim      Current Medications:   Current Outpatient Medications:    albuterol (VENTOLIN HFA) 108 (90 Base) MCG/ACT inhaler, Inhale 2 puffs into the lungs every 6 (six) hours as needed., Disp: , Rfl:    azelastine (ASTELIN) 0.1 % nasal spray, SPRAY 1 SPRAY INTO EACH NOSTRIL TWICE A DAY, Disp: , Rfl:    EPINEPHrine 0.3 mg/0.3 mL IJ SOAJ injection, SMARTSIG:0.3 Milliliter(s) IM Once PRN, Disp: , Rfl:    famotidine  (PEPCID ) 40 MG tablet, Take 40 mg by mouth 2 (two) times daily., Disp: , Rfl:    fluticasone (FLONASE) 50 MCG/ACT nasal spray, USE 1 2 SPRAYS IN EACH NOSTRIL ONCE A DAY, Disp: , Rfl:    hydroxychloroquine (PLAQUENIL) 200 MG tablet, 2 TABLETS WITH FOOD OR MILK ONCE A DAY, Disp: , Rfl: 2   ibuprofen  (ADVIL ) 600 MG tablet, Take 1 tablet (600 mg total) by mouth every 8 (eight) hours as needed., Disp:  30 tablet, Rfl: 0   levocetirizine (XYZAL) 5 MG tablet, Take 5 mg by mouth every evening., Disp: , Rfl:    levonorgestrel (MIRENA) 20 MCG/24HR IUD, 1 each by Intrauterine route once. Inserted 01/19/2018, needs to be removed 01/2023., Disp: , Rfl:    montelukast (SINGULAIR) 10 MG tablet, Take 10 mg by mouth daily., Disp: , Rfl: 1   SUMAtriptan  (IMITREX ) 50 MG tablet, Take 1 tablet (50 mg total) by mouth every 2 (two) hours as needed for migraine. May repeat in 2 hours if headache persists or recurs., Disp: 12 tablet, Rfl: 6   tirzepatide  (ZEPBOUND ) 2.5 MG/0.5ML Pen, Inject 2.5 mg into the skin once a week., Disp: 2 mL, Rfl: 1   topiramate  (TOPAMAX ) 100 MG tablet, Take 1 tablet (100 mg total) by mouth 2 (two) times daily., Disp: 180 tablet, Rfl: 3   Review of Systems:   Negative unless otherwise specified per  HPI.  Vitals:   Vitals:   04/04/24 0859  BP: 110/70  Pulse: 78  Temp: (!) 97.2 F (36.2 C)  TempSrc: Temporal  SpO2: 99%  Weight: 232 lb 6.1 oz (105.4 kg)  Height: 5\' 4"  (1.626 m)     Body mass index is 39.89 kg/m.  Physical Exam:   Physical Exam Constitutional:      Appearance: Normal appearance. She is well-developed.  HENT:     Head: Normocephalic and atraumatic.  Eyes:     General: Lids are normal.     Extraocular Movements: Extraocular movements intact.     Conjunctiva/sclera: Conjunctivae normal.  Pulmonary:     Effort: Pulmonary effort is normal.  Abdominal:     Comments: Approximate 1/2 cm area of superficial skin irritation to lower abdomen without tenderness to palpation   Musculoskeletal:        General: Normal range of motion.     Cervical back: Normal range of motion and neck supple.  Skin:    General: Skin is warm and dry.  Neurological:     Mental Status: She is alert and oriented to person, place, and time.  Psychiatric:        Attention and Perception: Attention and perception normal.        Mood and Affect: Mood normal.        Behavior: Behavior normal.        Thought Content: Thought content normal.        Judgment: Judgment normal.     Assessment and Plan:   1. Obesity, unspecified class, unspecified obesity type, unspecified whether serious comorbidity present (Primary) Improving Will continue Zepbound  2.5 mg weekly We are going to hold off on increase dosage of medication due to cost Continue efforts at healthy lifestyle If further injection site issues, she was instructed to reach out Follow up in 6 month(s), sooner if concerns  I, Bernita Bristle, acting as a Neurosurgeon for Alexander Iba, [place]., have documented all relevant documentation on the behalf of Alexander Iba, [place], as directed by  Alexander Iba, PA while in the presence of Alexander Iba, [place].  I, Alexander Iba, [place], have reviewed all documentation for this visit. The  documentation on 04/04/24 for the exam, diagnosis, procedures, and orders are all accurate and complete.  Alexander Iba, PA-C

## 2024-04-05 ENCOUNTER — Other Ambulatory Visit: Payer: Self-pay | Admitting: *Deleted

## 2024-04-05 MED ORDER — TIRZEPATIDE-WEIGHT MANAGEMENT 2.5 MG/0.5ML ~~LOC~~ SOLN: 2.5000 mg | SUBCUTANEOUS | 0 refills | Status: DC

## 2024-05-24 NOTE — Progress Notes (Addendum)
 PATIENT: [name] DOB: 07-May-1986  REASON FOR VISIT: Follow up for IIH HISTORY FROM: Patient PRIMARY NEUROLOGIST: Dr. Onita  ASSESSMENT AND PLAN 38 y.o. year old female :  1.  Intracranial hypertension 2.  Bilateral papilledema 3.  Obesity 4.  Frequent headaches with migraine features  - Doing well, headaches under good control, no vision changes.  Good stability with biannual ophthalmology evaluation, decreasing edema on OCT - Discussed the importance of weight loss for long-term management, refocused on weight loss, on Zepbound  - Continue Topamax  200 mg daily, Imitrex  as needed for acute headache - Continue every 71-month follow-up with ophthalmology, appointment in August  - Follow-up in our office in 6-8 months or sooner if needed   -Seen at [facility] on 11/09/22 by Clem Brands, PA.  The fundus exam revealed no abnormalities in the macula.  Nasal disc edema decreased bilaterally.  Clear to continue Plaquenil.  Decreasing edema on eye exam.  Follow-up in 6 months. 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 -LP on 10/09/21 showed opening pressure of 40 cm water consistent with diagnosis of IIH  Addendum 05/18/23 SS: Was seen at [facility] eye care 05/14/2023 RNFL stable, continue topiramate  per neurologist.  Cleared to continue Plaquenil.  Decreasing optic nerve edema on OCT.  Follow-up in 6 months.  Addendum 11/16/23 SS: Seen at [facility] 11/10/2023.  Cleared to continue Plaquenil.  Bilateral nasal disc edema decreased/distinct.  No disc pallor..  Decreasing edema on OCT.  Addendum 08/15/24 SS: I reviewed ophthalmology notes 08/10/2024 showing improved optic nerve edema.  Decreasing edema on OCT.  Bilateral optic nerve nasal disc edema decreased/distinct cup.  No disc pallor.  HISTORY  [name], is a 38 year old female, seen in request by  ophthalmologist Dr. Octavia for evaluation of bilateral papillary edema, her primary care PA Brands Clem, [place], initial evaluation was on September 26, 2021   I reviewed and summarized the referring note. PMHx. Inflammatory arthritis, under the care of [facility] Dr. Aryl, taking Plaquenil Depression, anxiety.   Patient was diagnosed with inflammatory arthritis, taking Plaquenil, over the past few years, she was seen by Dr. Eyvonne on a yearly basis, there was no significant abnormality find in the past, but at his most recent evaluation on July 14, 2021, posterior examination showed nasal disc edema bilaterally, mild blurring of margin, no disc pallor, CDR 0.1 left, 0.15 right eye. 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   She did report rapid weight gain of 50 pounds over the past 2 years  She had long history of intermittent headache, used to only have couple times each month, since 2022, she has more frequent headaches, at least couple times each week, taking frequent ibuprofen  with suboptimal response  She works at a desk job, complains of intermittent blurry vision, especially with sudden positional change, such as bending over,  Update December 09, 2021 SS: MRI of the brain without contrast in October 2022 showed no significant abnormality, there was evidence of 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  LP on 10/09/21 showed opening pressure of 40 cm water consistent with diagnosis of IIH.  Doing well on Topamax  200 mg at bedtime. Headaches are improved, less frequent, are 2-3 a month, less intense. Takes Imitrex , with good benefit, does make her nauseated. Saw Dr. Octavia  in November, swelling was improved. Will follow up in 1 year with him. Has lost 15 lbs since October. Has been doing walking for exercise. Works 2 jobs, at [employer] and [employer],  has 2 daughters (8 & 10).  Update 06/17/22 SS:  taking Topamax  200 mg at bedtime. Some family stress, no further weight loss. On the end of Ozempic . Some headaches with sinus/ear infections. Takes Imitrex  for migraines, only 1 a week, with fast relief. Sees Dr. Octavia in November.   Update December 31, 2022 SS: Has lost 28 lbs since July. Exercising, watching her diet. Sinus issues. Remains on Topamax  200 mg at bedtime. Uses Imitrex  PRN, takes 1 time a month. No vision changes. Has accepted new job at [employer] in [place]. Seen at [facility] on 11/09/22 by Clem Brands, PA.  The fundus exam revealed no abnormalities in the macula.  Nasal disc edema decreased bilaterally.  Clear to continue Plaquenil.  Decreasing edema on eye exam.  Follow-up in 6 months.   Update October 05, 2023 SS: via VV, her car wouldn't start. Remains on Topamax  200 mg daily. Few headaches, but mild, may take Imitrex  once a month. Under stress with job. Weight has increased 8 lbs. Getting back to working out. No vision changes. Going back to Dr. Octavia in November.   Update May 25, 2024 SS: Seen at [facility] eye care 11/10/2023.  Cleared to continue Plaquenil.  Bilateral nasal disc edema decreased/distinct.  No disc pallor..  Decreasing edema on OCT. Remains on Topamax  200 mg daily. 1-2 migraines a month, Imitrex  works great. No vision changes. Working on weight loss, on Zepbound . Working 2 jobs.  REVIEW OF SYSTEMS: Out of a complete 14 system review of symptoms, the patient complains only of the following symptoms, and all other reviewed systems are negative.  See HPI  ALLERGIES: Allergies  Allergen Reactions   Cephalosporins Nausea And Vomiting and Rash   Ondansetron  Nausea And Vomiting and Other (See Comments)    Chest pain and nose bleeds    Zofran  Nausea And Vomiting and Other (See Comments)    Chest pain and nose bleeds   Sulfamethoxazole -Trimethoprim      HOME MEDICATIONS: Outpatient Medications Prior to  Visit  Medication Sig Dispense Refill   albuterol (VENTOLIN HFA) 108 (90 Base) MCG/ACT inhaler Inhale 2 puffs into the lungs every 6 (six) hours as needed.     azelastine (ASTELIN) 0.1 % nasal spray SPRAY 1 SPRAY INTO EACH NOSTRIL TWICE A DAY     EPINEPHrine 0.3 mg/0.3 mL IJ SOAJ injection SMARTSIG:0.3 Milliliter(s) IM Once PRN     famotidine  (PEPCID ) 40 MG tablet Take 40 mg by mouth 2 (two) times daily.     fluticasone (FLONASE) 50 MCG/ACT nasal spray USE 1 2 SPRAYS IN EACH NOSTRIL ONCE A DAY     hydroxychloroquine (PLAQUENIL) 200 MG tablet 2 TABLETS WITH FOOD OR MILK ONCE A DAY  2   ibuprofen  (ADVIL ) 600 MG tablet Take 1 tablet (600 mg total) by mouth every 8 (eight) hours as needed. 30 tablet 0   levocetirizine (XYZAL) 5 MG tablet Take 5 mg by mouth every evening.     levonorgestrel (MIRENA) 20 MCG/24HR IUD 1 each by Intrauterine route once. Inserted 01/19/2018, needs to be removed 01/2023.     montelukast (SINGULAIR) 10 MG tablet Take 10 mg by mouth daily.  1   SUMAtriptan  (IMITREX ) 50 MG tablet Take 1 tablet (50 mg total) by mouth every 2 (two) hours as needed for  migraine. May repeat in 2 hours if headache persists or recurs. 12 tablet 6   tirzepatide  (ZEPBOUND ) 2.5 MG/0.5ML injection vial Inject 2.5 mg into the skin once a week. 2 mL 0   topiramate  (TOPAMAX ) 100 MG tablet Take 1 tablet (100 mg total) by mouth 2 (two) times daily. 180 tablet 3   No facility-administered medications prior to visit.    PAST MEDICAL HISTORY: Past Medical History:  Diagnosis Date   Acid reflux    Allergy    Anxiety    Depression    Hyperlipidemia    Inflammatory arthritis 02/18/2018    PAST SURGICAL HISTORY: Past Surgical History:  Procedure Laterality Date   PERIPHERALLY INSERTED CENTRAL CATHETER INSERTION     WISDOM TOOTH EXTRACTION     WISDOM TOOTH EXTRACTION Bilateral     FAMILY HISTORY: Family History  Problem Relation Age of Onset   Hypertension Mother    Hyperlipidemia Mother     Arthritis Mother    Bipolar disorder Sister    Suicidality Sister    Heart disease Maternal Grandmother    Heart disease Maternal Grandfather    Heart disease Paternal Grandmother    Heart disease Paternal Grandfather    Diabetes Paternal Aunt    Diabetes Paternal Uncle    Breast cancer Neg Hx    Colon cancer Neg Hx     SOCIAL HISTORY: Social History   Socioeconomic History   Marital status: Married    Spouse name: Not on file   Number of children: Not on file   Years of education: Not on file   Highest education level: Master's degree (e.g., MA, MS, MEng, MEd, MSW, MBA)  Occupational History   Not on file  Tobacco Use   Smoking status: Never   Smokeless tobacco: Never  Vaping Use   Vaping status: Never Used  Substance and Sexual Activity   Alcohol use: Yes    Comment: very rare   Drug use: No   Sexual activity: Yes    Birth control/protection: I.U.D., None  Other Topics Concern   Not on file  Social History Narrative   Right handed   Coffee- 2 cups per week   Married with 2 kids -- daughters   Works at [employer] and [employer] (2nd shift)   Live in [place]   Social Drivers of Health   Financial Resource Strain: Low Risk  (12/06/2023)   Overall Financial Resource Strain (CARDIA)    Difficulty of Paying Living Expenses: Not very hard  Food Insecurity: No Food Insecurity (12/06/2023)   Hunger Vital Sign    Worried About Running Out of Food in the Last Year: Never true    Ran Out of Food in the Last Year: Never true  Transportation Needs: No Transportation Needs (12/06/2023)   PRAPARE - Administrator, Civil Service (Medical): No    Lack of Transportation (Non-Medical): No  Physical Activity: Insufficiently Active (12/06/2023)   Exercise Vital Sign    Days of Exercise per Week: 2 days    Minutes of Exercise per Session: 40 min  Stress: No Stress Concern Present (12/06/2023)   [employer] of Occupational Health - Occupational Stress Questionnaire     Feeling of Stress : Not at all  Social Connections: Moderately Integrated (12/06/2023)   Social Connection and Isolation Panel    Frequency of Communication with Friends and Family: Three times a week    Frequency of Social Gatherings with Friends and Family: Twice a week  Attends Religious Services: 1 to 4 times per year    Active Member of Clubs or Organizations: No    Attends Engineer, structural: Not on file    Marital Status: Married  Catering manager Violence: Not on file   PHYSICAL EXAM  Physical Exam  General: The patient is alert and cooperative at the time of the examination.  Skin: No significant peripheral edema is noted.  Neurologic Exam  Mental status: The patient is alert and oriented x 3 at the time of the examination. The patient has apparent normal recent and remote memory, with an apparently normal attention span and concentration ability.  Cranial nerves: Facial symmetry is present. Speech is normal, no aphasia or dysarthria is noted. Extraocular movements are full. Visual fields are full.  Motor: The patient has good strength in all 4 extremities.  Sensory examination: Soft touch sensation is symmetric on the face, arms, and legs.  Coordination: The patient has good finger-nose-finger and heel-to-shin bilaterally.  Gait and station: The patient has a normal gait.   Reflexes: Deep tendon reflexes are symmetric.   DIAGNOSTIC DATA (LABS, IMAGING, TESTING) - I reviewed patient records, labs, notes, testing and imaging myself where available.  Lab Results  Component Value Date   WBC 8.6 12/31/2023   HGB 14.1 12/31/2023   HCT 41.8 12/31/2023   MCV 100.0 12/31/2023   PLT 324.0 12/31/2023      Component Value Date/Time   NA 134 (L) 12/31/2023 1327   NA 137 05/13/2023 0000   K 3.6 12/31/2023 1327   CL 107 12/31/2023 1327   CO2 19 12/31/2023 1327   GLUCOSE 75 12/31/2023 1327   BUN 10 12/31/2023 1327   BUN 12 05/13/2023 0000    CREATININE 0.77 12/31/2023 1327   CALCIUM  9.2 12/31/2023 1327   PROT 7.6 12/31/2023 1327   ALBUMIN 4.4 12/31/2023 1327   ALBUMIN 5.0 10/09/2021 0901   AST 20 12/31/2023 1327   ALT 24 12/31/2023 1327   ALKPHOS 58 12/31/2023 1327   BILITOT 0.6 12/31/2023 1327   GFRNONAA 75.89 01/23/2020 0000   GFRAA 91.83 01/23/2020 0000   Lab Results  Component Value Date   CHOL 237 (H) 12/31/2023   HDL 49.40 12/31/2023   LDLCALC 175 (H) 12/31/2023   TRIG 63.0 12/31/2023   CHOLHDL 5 12/31/2023   Lab Results  Component Value Date   HGBA1C 5.0 12/09/2022   No results found for: [id number] Lab Results  Component Value Date   TSH 3.02 06/15/2023   Lauraine Born, AGNP-C, DNP 05/25/2024, 7:54 AM [facility] [address] [place], [place] [address] [phone]

## 2024-05-25 ENCOUNTER — Ambulatory Visit: Payer: BC Managed Care – PPO | Admitting: Neurology

## 2024-05-25 ENCOUNTER — Encounter: Payer: Self-pay | Admitting: Neurology

## 2024-05-25 VITALS — BP 116/72 | HR 82 | Resp 16 | Ht 66.0 in

## 2024-05-25 DIAGNOSIS — G43701 Chronic migraine without aura, not intractable, with status migrainosus: Secondary | ICD-10-CM | POA: Diagnosis not present

## 2024-05-25 DIAGNOSIS — G932 Benign intracranial hypertension: Secondary | ICD-10-CM | POA: Diagnosis not present

## 2024-05-25 MED ORDER — SUMATRIPTAN SUCCINATE 50 MG PO TABS: 50.0000 mg | ORAL_TABLET | ORAL | 6 refills | Status: DC | PRN

## 2024-05-25 MED ORDER — TOPIRAMATE 100 MG PO TABS: 100.0000 mg | ORAL_TABLET | Freq: Two times a day (BID) | ORAL | 3 refills | Status: DC

## 2024-05-25 NOTE — Patient Instructions (Signed)
 Great to see you today.  Continue Topamax  and Imitrex .  Continue to work on weight loss.  Keep follow-up appointment with ophthalmology in August.  Please have them send the notes over.  Call for any concerns.  Follow-up in 6 to 8 months.  Thanks!!

## 2024-05-26 ENCOUNTER — Encounter: Payer: Self-pay | Admitting: Physician Assistant

## 2024-08-10 ENCOUNTER — Encounter: Payer: Self-pay | Admitting: Physician Assistant

## 2024-08-10 ENCOUNTER — Ambulatory Visit: Admitting: Physician Assistant

## 2024-08-10 VITALS — BP 120/84 | HR 73 | Temp 97.3°F | Ht 66.0 in | Wt 238.0 lb

## 2024-08-10 DIAGNOSIS — K625 Hemorrhage of anus and rectum: Secondary | ICD-10-CM

## 2024-08-10 DIAGNOSIS — R197 Diarrhea, unspecified: Secondary | ICD-10-CM | POA: Diagnosis not present

## 2024-08-10 DIAGNOSIS — R1110 Vomiting, unspecified: Secondary | ICD-10-CM

## 2024-08-10 MED ORDER — AMITRIPTYLINE HCL 10 MG PO TABS: 10.0000 mg | ORAL_TABLET | Freq: Every day | ORAL | 1 refills | Status: DC

## 2024-08-10 NOTE — Progress Notes (Signed)
 [name] is a 38 y.o. female here for a follow up of a pre-existing problem.  History of Present Illness:   Chief Complaint  Patient presents with   GI Problem    Pt c/o rectal bleeding x several months, and constipation. Pt would like a referral to GI.    Discussed the use of AI scribe software for clinical note transcription with the patient, who gave verbal consent to proceed.  History of Present Illness [name] is a 38 year old female who presents with rectal bleeding and gastrointestinal symptoms.  Rectal bleeding has been present for several months, with blood in stool, clots, and pain and burning during and after defecation. She experiences difficulty with bowel movements, a sensation of obstruction, and changes in stool consistency, leading to straining and discomfort.  She has gastrointestinal issues, including vomiting after consuming foods like spaghetti and meatloaf, sometimes followed by diarrhea. She occasionally takes a daily vitamin and probiotics when on antibiotics.  Family history includes colon polyps in her mother and pancreatic cancer in a maternal uncle. Another maternal uncle has bone cancer. One sister is due for a colonoscopy soon.  She is under significant stress due to family issues, including her uncle's illness and strained relationships. There is a family history of bipolar disorder, with her sister diagnosed and treated for it. She experiences body aches and stomach aches, possibly related to stress.  She previously used Zepbound  but discontinued it due to cost. She has noticed weight gain and increased stress levels. She has a history of using Wellbutrin , which caused stomach discomfort, and is currently not on any mood-related medication.    Past Medical History:  Diagnosis Date   Acid reflux    Allergy    Anxiety    Depression    Hyperlipidemia    Inflammatory arthritis 02/18/2018     Social History   Tobacco Use   Smoking  status: Never   Smokeless tobacco: Never  Vaping Use   Vaping status: Never Used  Substance Use Topics   Alcohol use: Yes    Comment: very rare   Drug use: No    Past Surgical History:  Procedure Laterality Date   PERIPHERALLY INSERTED CENTRAL CATHETER INSERTION     WISDOM TOOTH EXTRACTION     WISDOM TOOTH EXTRACTION Bilateral     Family History  Problem Relation Age of Onset   Hypertension Mother    Hyperlipidemia Mother    Arthritis Mother    Colon polyps Mother    Bipolar disorder Sister    Suicidality Sister    Depression Sister    Heart disease Maternal Grandmother    Heart disease Maternal Grandfather    Heart disease Paternal Grandmother    Heart disease Paternal Grandfather    Diabetes Paternal Aunt    Diabetes Paternal Uncle    Pancreatic cancer Maternal Uncle    Bone cancer Maternal Uncle    Breast cancer Neg Hx    Colon cancer Neg Hx     Allergies  Allergen Reactions   Cephalosporins Nausea And Vomiting and Rash   Ondansetron  Nausea And Vomiting and Other (See Comments)    Chest pain and nose bleeds    Zofran  Nausea And Vomiting and Other (See Comments)    Chest pain and nose bleeds   Sulfamethoxazole -Trimethoprim      Current Medications:   Current Outpatient Medications:    albuterol (VENTOLIN HFA) 108 (90 Base) MCG/ACT inhaler, Inhale 2 puffs into the lungs every 6 (six)  hours as needed., Disp: , Rfl:    amitriptyline  (ELAVIL ) 10 MG tablet, Take 1 tablet (10 mg total) by mouth at bedtime., Disp: 90 tablet, Rfl: 1   azelastine (ASTELIN) 0.1 % nasal spray, SPRAY 1 SPRAY INTO EACH NOSTRIL TWICE A DAY, Disp: , Rfl:    EPINEPHrine 0.3 mg/0.3 mL IJ SOAJ injection, SMARTSIG:0.3 Milliliter(s) IM Once PRN, Disp: , Rfl:    famotidine  (PEPCID ) 40 MG tablet, Take 40 mg by mouth 2 (two) times daily., Disp: , Rfl:    fluticasone (FLONASE) 50 MCG/ACT nasal spray, USE 1 2 SPRAYS IN EACH NOSTRIL ONCE A DAY, Disp: , Rfl:    hydroxychloroquine (PLAQUENIL) 200 MG  tablet, 2 TABLETS WITH FOOD OR MILK ONCE A DAY, Disp: , Rfl: 2   ibuprofen  (ADVIL ) 600 MG tablet, Take 1 tablet (600 mg total) by mouth every 8 (eight) hours as needed., Disp: 30 tablet, Rfl: 0   levocetirizine (XYZAL) 5 MG tablet, Take 5 mg by mouth every evening., Disp: , Rfl:    levonorgestrel (MIRENA) 20 MCG/24HR IUD, 1 each by Intrauterine route once. Inserted 01/19/2018, needs to be removed 01/2023., Disp: , Rfl:    montelukast (SINGULAIR) 10 MG tablet, Take 10 mg by mouth daily., Disp: , Rfl: 1   SUMAtriptan  (IMITREX ) 50 MG tablet, Take 1 tablet (50 mg total) by mouth every 2 (two) hours as needed for migraine. May repeat in 2 hours if headache persists or recurs., Disp: 12 tablet, Rfl: 6   topiramate  (TOPAMAX ) 100 MG tablet, Take 1 tablet (100 mg total) by mouth 2 (two) times daily., Disp: 180 tablet, Rfl: 3   Review of Systems:   Negative unless otherwise specified per HPI.  Vitals:   Vitals:   08/10/24 0817  BP: 120/84  Pulse: 73  Temp: (!) 97.3 F (36.3 C)  TempSrc: Temporal  SpO2: 98%  Weight: 238 lb (108 kg)  Height: 5' 6 (1.676 m)     Body mass index is 38.41 kg/m.  Physical Exam:   Physical Exam  Assessment and Plan:   Assessment and Plan Assessment & Plan Rectal bleeding and anal pain Rectal bleeding and anal pain likely due to internal hemorrhoids or anal fissure. Family history of colon polyps and pancreatic cancer noted. - Refer to gastroenterology for evaluation and colonoscopy. - Perform thorough rectal examination. - Consider use of diaper cream for soothing irritated skin.  Gastrointestinal symptoms (vomiting after certain foods, altered stool consistency, abdominal pain) Intermittent vomiting and diarrhea after certain foods with no clear etiology. Possible stress-related exacerbation  - Consider trial of probiotics and/or fiber. - Monitor for any correlation with specific foods or stressors.  Mood symptoms (stress, possible  depression) Significant stress and possible depression with family history of bipolar disorder. Discontinued Wellbutrin  due to stomach issues. Discussed amitriptyline  for anxiety, depression, and IBS-related symptoms. - Start low-dose amitriptyline  at night. - Inform husband about starting amitriptyline  for monitoring of mood disturbances - Schedule follow-up before the end of the year to assess response to treatment.     Lucie Buttner, PA-C

## 2024-08-11 ENCOUNTER — Encounter: Payer: Self-pay | Admitting: Neurology

## 2024-08-21 ENCOUNTER — Encounter: Payer: Self-pay | Admitting: Gastroenterology

## 2024-10-04 ENCOUNTER — Encounter: Payer: Self-pay | Admitting: Physician Assistant

## 2024-10-04 ENCOUNTER — Ambulatory Visit: Admitting: Physician Assistant

## 2024-10-04 VITALS — BP 128/74 | HR 104 | Temp 97.2°F | Ht 66.0 in | Wt 248.4 lb

## 2024-10-04 DIAGNOSIS — K625 Hemorrhage of anus and rectum: Secondary | ICD-10-CM | POA: Diagnosis not present

## 2024-10-04 DIAGNOSIS — H6690 Otitis media, unspecified, unspecified ear: Secondary | ICD-10-CM

## 2024-10-04 DIAGNOSIS — F419 Anxiety disorder, unspecified: Secondary | ICD-10-CM

## 2024-10-04 DIAGNOSIS — G43701 Chronic migraine without aura, not intractable, with status migrainosus: Secondary | ICD-10-CM | POA: Diagnosis not present

## 2024-10-04 DIAGNOSIS — E669 Obesity, unspecified: Secondary | ICD-10-CM | POA: Diagnosis not present

## 2024-10-04 MED ORDER — AMITRIPTYLINE HCL 25 MG PO TABS: 25.0000 mg | ORAL_TABLET | Freq: Every day | ORAL | 1 refills | Status: AC

## 2024-10-04 MED ORDER — AMOXICILLIN 875 MG PO TABS: 875.0000 mg | ORAL_TABLET | Freq: Two times a day (BID) | ORAL | 0 refills | Status: AC

## 2024-10-04 NOTE — Progress Notes (Signed)
 [name] is a 38 y.o. female here for a follow up of a pre-existing problem.  History of Present Illness:   Chief Complaint  Patient presents with   Weight Management Screening   Discussed the use of AI scribe software for clinical note transcription with the patient, who gave verbal consent to proceed.  History of Present Illness   [name] is a 38 year old female who presents with persistent cough and ear pain.  She has had a persistent cough for four weeks, initially following a viral infection and the flu. After receiving a flu shot, she developed a sinus infection treated with antibiotics and cough medication, but the cough has returned. Ear pain began a few days ago, persisting despite treatment with doxycycline  for a sinus infection. She is allergic to cephalosporin but tolerates amoxicillin . She uses nasal sprays and allergy medications, which she believes are influenced by the weather.   Currently, she takes amitriptyline  for mental health, insomnia and headaches. She is taking 10 mg daily and tolerating well. Reports there is no concerning side effect(s) of medication, would like potential increase.  Continues efforts at trying to lose weight.       Past Medical History:  Diagnosis Date   Acid reflux    Allergy    Anxiety    Depression    Hyperlipidemia    Inflammatory arthritis 02/18/2018     Social History   Tobacco Use   Smoking status: Never   Smokeless tobacco: Never  Vaping Use   Vaping status: Never Used  Substance Use Topics   Alcohol use: Yes    Comment: very rare   Drug use: No    Past Surgical History:  Procedure Laterality Date   PERIPHERALLY INSERTED CENTRAL CATHETER INSERTION     WISDOM TOOTH EXTRACTION     WISDOM TOOTH EXTRACTION Bilateral     Family History  Problem Relation Age of Onset   Hypertension Mother    Hyperlipidemia Mother    Arthritis Mother    Colon polyps Mother    Bipolar disorder Sister    Suicidality  Sister    Depression Sister    Heart disease Maternal Grandmother    Heart disease Maternal Grandfather    Heart disease Paternal Grandmother    Heart disease Paternal Grandfather    Diabetes Paternal Aunt    Diabetes Paternal Uncle    Pancreatic cancer Maternal Uncle    Bone cancer Maternal Uncle    Breast cancer Neg Hx    Colon cancer Neg Hx     Allergies  Allergen Reactions   Cephalosporins Nausea And Vomiting and Rash   Ondansetron  Nausea And Vomiting and Other (See Comments)    Chest pain and nose bleeds    Zofran  Nausea And Vomiting and Other (See Comments)    Chest pain and nose bleeds   Sulfamethoxazole -Trimethoprim      Current Medications:   Current Outpatient Medications:    albuterol (VENTOLIN HFA) 108 (90 Base) MCG/ACT inhaler, Inhale 2 puffs into the lungs every 6 (six) hours as needed., Disp: , Rfl:    amitriptyline  (ELAVIL ) 10 MG tablet, Take 1 tablet (10 mg total) by mouth at bedtime., Disp: 90 tablet, Rfl: 1   azelastine (ASTELIN) 0.1 % nasal spray, SPRAY 1 SPRAY INTO EACH NOSTRIL TWICE A DAY, Disp: , Rfl:    EPINEPHrine 0.3 mg/0.3 mL IJ SOAJ injection, SMARTSIG:0.3 Milliliter(s) IM Once PRN, Disp: , Rfl:    fluticasone (FLONASE) 50 MCG/ACT nasal spray, USE 1  2 SPRAYS IN EACH NOSTRIL ONCE A DAY, Disp: , Rfl:    hydroxychloroquine (PLAQUENIL) 200 MG tablet, 2 TABLETS WITH FOOD OR MILK ONCE A DAY, Disp: , Rfl: 2   ibuprofen  (ADVIL ) 600 MG tablet, Take 1 tablet (600 mg total) by mouth every 8 (eight) hours as needed., Disp: 30 tablet, Rfl: 0   levocetirizine (XYZAL) 5 MG tablet, Take 5 mg by mouth every evening., Disp: , Rfl:    levonorgestrel (MIRENA) 20 MCG/24HR IUD, 1 each by Intrauterine route once. Inserted 01/19/2018, needs to be removed 01/2023., Disp: , Rfl:    montelukast (SINGULAIR) 10 MG tablet, Take 10 mg by mouth daily., Disp: , Rfl: 1   SUMAtriptan  (IMITREX ) 50 MG tablet, Take 1 tablet (50 mg total) by mouth every 2 (two) hours as needed for migraine.  May repeat in 2 hours if headache persists or recurs., Disp: 12 tablet, Rfl: 6   topiramate  (TOPAMAX ) 100 MG tablet, Take 1 tablet (100 mg total) by mouth 2 (two) times daily., Disp: 180 tablet, Rfl: 3   Review of Systems:   Negative unless otherwise specified per HPI.  Vitals:   Vitals:   10/04/24 1405  BP: 128/74  Pulse: (!) 104  Temp: (!) 97.2 F (36.2 C)  TempSrc: Temporal  SpO2: 99%  Weight: 248 lb 6.1 oz (112.7 kg)  Height: 5' 6 (1.676 m)     Body mass index is 40.09 kg/m.  Physical Exam:   Physical Exam Vitals and nursing note reviewed.  Constitutional:      General: She is not in acute distress.    Appearance: She is well-developed. She is not ill-appearing or toxic-appearing.  HENT:     Head: Normocephalic and atraumatic.     Right Ear: Ear canal and external ear normal. A middle ear effusion is present. Tympanic membrane is not erythematous, retracted or bulging.     Left Ear: Ear canal and external ear normal. A middle ear effusion is present. Tympanic membrane is erythematous. Tympanic membrane is not retracted or bulging.     Nose: Nose normal.     Right Sinus: No maxillary sinus tenderness or frontal sinus tenderness.     Left Sinus: No maxillary sinus tenderness or frontal sinus tenderness.     Mouth/Throat:     Pharynx: Uvula midline. No posterior oropharyngeal erythema.  Eyes:     General: Lids are normal.     Conjunctiva/sclera: Conjunctivae normal.  Neck:     Trachea: Trachea normal.  Cardiovascular:     Rate and Rhythm: Normal rate and regular rhythm.     Heart sounds: Normal heart sounds, S1 normal and S2 normal.  Pulmonary:     Effort: Pulmonary effort is normal.     Breath sounds: Normal breath sounds. No decreased breath sounds, wheezing, rhonchi or rales.  Lymphadenopathy:     Cervical: No cervical adenopathy.  Skin:    General: Skin is warm and dry.  Neurological:     Mental Status: She is alert.  Psychiatric:        Speech: Speech  normal.        Behavior: Behavior normal. Behavior is cooperative.     Assessment and Plan:   Assessment and Plan     Recurrent AOM (acute otitis media) Chronic sinusitis with ear pain and cough persists despite doxycycline . Possible sinus infection post-flu vaccination. CT scan considered but cost-prohibitive. - Start amoxicillin , take with food.  Chronic migraine w/o aura, not intractable, w stat migr Headache managed  with Topamax  and amitriptyline . Amitriptyline  aids relaxation and sleep. Topamax  may cause drowsiness but improves sleep and well-being. Consider deprescribing Topamax  if amitriptyline  remains effective. - Increase amitriptyline  to 25 mg.  Anxiety Amitriptyline  aids relaxation and sleep, improving mood stability. - Increase amitriptyline  to 25 mg.  Obesity Discussed Wegovy  for weight management and fatty liver disease. Advised to delay until after gastroenterology appointment. - Provide Wegovy  samples, advise to start after gastroenterology appointment.      Lucie Buttner, PA-C

## 2024-10-12 ENCOUNTER — Ambulatory Visit: Payer: Self-pay | Admitting: Gastroenterology

## 2024-10-12 ENCOUNTER — Ambulatory Visit: Admitting: Gastroenterology

## 2024-10-12 ENCOUNTER — Other Ambulatory Visit

## 2024-10-12 ENCOUNTER — Encounter: Payer: Self-pay | Admitting: Gastroenterology

## 2024-10-12 VITALS — BP 110/80 | HR 92 | Ht 64.0 in | Wt 250.1 lb

## 2024-10-12 DIAGNOSIS — K219 Gastro-esophageal reflux disease without esophagitis: Secondary | ICD-10-CM | POA: Diagnosis not present

## 2024-10-12 DIAGNOSIS — K625 Hemorrhage of anus and rectum: Secondary | ICD-10-CM

## 2024-10-12 DIAGNOSIS — R1319 Other dysphagia: Secondary | ICD-10-CM

## 2024-10-12 DIAGNOSIS — Z83719 Family history of colon polyps, unspecified: Secondary | ICD-10-CM

## 2024-10-12 DIAGNOSIS — K6289 Other specified diseases of anus and rectum: Secondary | ICD-10-CM

## 2024-10-12 DIAGNOSIS — R194 Change in bowel habit: Secondary | ICD-10-CM

## 2024-10-12 DIAGNOSIS — K648 Other hemorrhoids: Secondary | ICD-10-CM | POA: Diagnosis not present

## 2024-10-12 DIAGNOSIS — R1310 Dysphagia, unspecified: Secondary | ICD-10-CM

## 2024-10-12 LAB — COMPREHENSIVE METABOLIC PANEL WITH GFR
ALT: 37 U/L — ABNORMAL HIGH (ref 0–35)
AST: 21 U/L (ref 0–37)
Albumin: 4.4 g/dL (ref 3.5–5.2)
Alkaline Phosphatase: 54 U/L (ref 39–117)
BUN: 13 mg/dL (ref 6–23)
CO2: 22 meq/L (ref 19–32)
Calcium: 9 mg/dL (ref 8.4–10.5)
Chloride: 110 meq/L (ref 96–112)
Creatinine, Ser: 0.75 mg/dL (ref 0.40–1.20)
GFR: 100.65 mL/min (ref >60.00)
Glucose, Bld: 95 mg/dL (ref 70–99)
Potassium: 3.8 meq/L (ref 3.5–5.1)
Sodium: 139 meq/L (ref 135–145)
Total Bilirubin: 0.3 mg/dL (ref 0.2–1.2)
Total Protein: 7.1 g/dL (ref 6.0–8.3)

## 2024-10-12 LAB — C-REACTIVE PROTEIN: CRP: <0.5 mg/dL (ref 0.5–20.0)

## 2024-10-12 LAB — CBC WITH DIFFERENTIAL/PLATELET
Basophils Absolute: 0.1 K/uL (ref 0.0–0.1)
Basophils Relative: 0.7 % (ref 0.0–3.0)
Eosinophils Absolute: 0.3 K/uL (ref 0.0–0.7)
Eosinophils Relative: 4.3 % (ref 0.0–5.0)
HCT: 40.2 % (ref 36.0–46.0)
Hemoglobin: 13.6 g/dL (ref 12.0–15.0)
Lymphocytes Relative: 28.9 % (ref 12.0–46.0)
Lymphs Abs: 2.1 K/uL (ref 0.7–4.0)
MCHC: 33.9 g/dL (ref 30.0–36.0)
MCV: 98.8 fl (ref 78.0–100.0)
Monocytes Absolute: 0.8 K/uL (ref 0.1–1.0)
Monocytes Relative: 11.2 % (ref 3.0–12.0)
Neutro Abs: 4 K/uL (ref 1.4–7.7)
Neutrophils Relative %: 54.9 % (ref 43.0–77.0)
Platelets: 310 K/uL (ref 150.0–400.0)
RBC: 4.07 Mil/uL (ref 3.87–5.11)
RDW: 13.5 % (ref 11.5–15.5)
WBC: 7.3 K/uL (ref 4.0–10.5)

## 2024-10-12 LAB — TSH: TSH: 2.24 u[IU]/mL (ref 0.35–5.50)

## 2024-10-12 MED ORDER — NA SULFATE-K SULFATE-MG SULF 17.5-3.13-1.6 GM/177ML PO SOLN: 1.0000 | Freq: Once | ORAL | 0 refills | Status: AC

## 2024-10-12 MED ORDER — HYDROCORTISONE ACETATE 25 MG RE SUPP: 25.0000 mg | Freq: Every evening | RECTAL | 0 refills | Status: AC

## 2024-10-12 NOTE — Patient Instructions (Addendum)
 Hemorrhoids Anusol 1 suppository at bedtime Recommend high fiber diet  GERD Can use otc pepcid  as needed GERD diet  We have sent the following medications to your pharmacy for you to pick up at your convenience: SUPREP  Your provider has requested that you go to the basement level for lab work before leaving today. Press B on the elevator. The lab is located at the first door on the left as you exit the elevator.  You have been scheduled for an endoscopy and colonoscopy. Please follow the written instructions given to you at your visit today.  If you use inhalers (even only as needed), please bring them with you on the day of your procedure.  DO NOT TAKE 7 DAYS PRIOR TO TEST- Trulicity (dulaglutide) Ozempic , Wegovy  (semaglutide ) Mounjaro  (tirzepatide ) Bydureon Bcise (exanatide extended release)  DO NOT TAKE 1 DAY PRIOR TO YOUR TEST Rybelsus (semaglutide ) Adlyxin (lixisenatide) Victoza (liraglutide) Byetta (exanatide) ___________________________________________________________________________  Due to recent changes in healthcare laws, you may see the results of your imaging and laboratory studies on MyChart before your provider has had a chance to review them.  We understand that in some cases there may be results that are confusing or concerning to you. Not all laboratory results come back in the same time frame and the provider may be waiting for multiple results in order to interpret others.  Please give us  48 hours in order for your provider to thoroughly review all the results before contacting the office for clarification of your results.   _______________________________________________________  If your blood pressure at your visit was 140/90 or greater, please contact your primary care physician to follow up on this.  _______________________________________________________  If you are age 20 or older, your body mass index should be between 23-30. Your Body mass index is  42.93 kg/m. If this is out of the aforementioned range listed, please consider follow up with your Primary Care Provider.  If you are age 75 or younger, your body mass index should be between 19-25. Your Body mass index is 42.93 kg/m. If this is out of the aformentioned range listed, please consider follow up with your Primary Care Provider.   ________________________________________________________  The [facility] providers would like to encourage you to use MYCHART to communicate with providers for non-urgent requests or questions.  Due to long hold times on the telephone, sending your provider a message by [facility] may be a faster and more efficient way to get a response.  Please allow 48 business hours for a response.  Please remember that this is for non-urgent requests.  _______________________________________________________  [facility] is using a team-based approach to care.  Your team is made up of your doctor and two to three APPS. Our APPS (Nurse Practitioners and Physician Assistants) work with your physician to ensure care continuity for you. They are fully qualified to address your health concerns and develop a treatment plan. They communicate directly with your gastroenterologist to care for you. Seeing the Advanced Practice Practitioners on your physician's team can help you by facilitating care more promptly, often allowing for earlier appointments, access to diagnostic testing, procedures, and other specialty referrals.   Thank you for trusting me with your gastrointestinal care. Deanna May, FNP-C

## 2024-10-12 NOTE — Progress Notes (Signed)
 Chief Complaint:rectal bleeding Primary GI Doctor:Dr. Charlanne  HPI:  Patient is a  38  year old female patient with past medical history of GERD, inflammatory arthritis,anxiety, and depression, who was referred to me by Job Lukes, PA on 08/10/24 for a evaluation of rectal bleeding .    Interval History    Patient presents for evaluation of rectal bleeding with bowel movements and wiping she reports started 4 months ago. She notes she has passed some blood clots. She has strained and passed hard stools and also had episodes of diarrhea. No new medications. No exposure. No recent travel.  She has history of GERD and reports it is worse with tomato based products. Not currently taking any antiacids. She reports trying to manage with dietary modifications.  She reports intermittent esophageal dysphagia. Appetite good. No unexplained weight loss.   Nonsmoker. Drinks on special occasions.  Uses OTC Ibuprofen  prn for headaches.  Surgical history: none  Patient's family history includes: mother with colon polyps- precancerous, uncle with bone CA, uncle with prostate.   Wt Readings from Last 3 Encounters:  10/12/24 250 lb 2 oz (113.5 kg)  10/04/24 248 lb 6.1 oz (112.7 kg)  08/10/24 238 lb (108 kg)    Past Medical History:  Diagnosis Date   Acid reflux    Allergy    Anxiety    Autism    Depression    Headache    Hyperlipidemia    Inflammatory arthritis 02/18/2018    Past Surgical History:  Procedure Laterality Date   PERIPHERALLY INSERTED CENTRAL CATHETER INSERTION     WISDOM TOOTH EXTRACTION Bilateral     Current Outpatient Medications  Medication Sig Dispense Refill   albuterol (VENTOLIN HFA) 108 (90 Base) MCG/ACT inhaler Inhale 2 puffs into the lungs every 6 (six) hours as needed.     amitriptyline  (ELAVIL ) 25 MG tablet Take 1 tablet (25 mg total) by mouth at bedtime. 90 tablet 1   amoxicillin  (AMOXIL ) 875 MG tablet Take 1 tablet (875 mg total) by mouth 2 (two) times  daily for 10 days. 20 tablet 0   azelastine (ASTELIN) 0.1 % nasal spray SPRAY 1 SPRAY INTO EACH NOSTRIL TWICE A DAY     EPINEPHrine 0.3 mg/0.3 mL IJ SOAJ injection SMARTSIG:0.3 Milliliter(s) IM Once PRN     fluticasone (FLONASE) 50 MCG/ACT nasal spray USE 1 2 SPRAYS IN EACH NOSTRIL ONCE A DAY     hydrocortisone (ANUSOL-HC) 25 MG suppository Place 1 suppository (25 mg total) rectally at bedtime. 10 suppository 0   hydroxychloroquine (PLAQUENIL) 200 MG tablet 2 TABLETS WITH FOOD OR MILK ONCE A DAY  2   ibuprofen  (ADVIL ) 600 MG tablet Take 1 tablet (600 mg total) by mouth every 8 (eight) hours as needed. 30 tablet 0   levocetirizine (XYZAL) 5 MG tablet Take 5 mg by mouth every evening.     levonorgestrel (MIRENA) 20 MCG/24HR IUD 1 each by Intrauterine route once. Inserted 01/19/2018, needs to be removed 01/2023.     montelukast (SINGULAIR) 10 MG tablet Take 10 mg by mouth daily.  1   Na Sulfate-K Sulfate-Mg Sulfate concentrate (SUPREP) 17.5-3.13-1.6 GM/177ML SOLN Take 1 kit (354 mLs total) by mouth once for 1 dose. 354 mL 0   SUMAtriptan  (IMITREX ) 50 MG tablet Take 1 tablet (50 mg total) by mouth every 2 (two) hours as needed for migraine. Davis Ambrosini repeat in 2 hours if headache persists or recurs. 12 tablet 6   topiramate  (TOPAMAX ) 100 MG tablet Take 1 tablet (  100 mg total) by mouth 2 (two) times daily. 180 tablet 3   No current facility-administered medications for this visit.    Allergies as of 10/12/2024 - Review Complete 10/12/2024  Allergen Reaction Noted   Cephalosporins Nausea And Vomiting and Rash 08/28/2011   Ondansetron  Nausea And Vomiting and Other (See Comments) 08/05/2011   Zofran  Nausea And Vomiting and Other (See Comments) 08/05/2011   Sulfamethoxazole -trimethoprim       Family History  Problem Relation Age of Onset   Hypertension Mother    Hyperlipidemia Mother    Arthritis Mother    Colon polyps Mother    Other Mother        fatty liver   Hyperlipidemia Father    Bipolar  disorder Sister    Suicidality Sister    Depression Sister    Heart disease Maternal Grandmother    Heart disease Maternal Grandfather    Heart disease Paternal Grandmother    Heart disease Paternal Grandfather    Pancreatic cancer Maternal Uncle    Bone cancer Maternal Uncle    Diabetes Paternal Aunt    Diabetes Paternal Uncle    Autism Daughter    Autism Daughter    Breast cancer Neg Hx    Colon cancer Neg Hx     Review of Systems:    Constitutional: No weight loss, fever, chills, weakness or fatigue HEENT: Eyes: No change in vision               Ears, Nose, Throat:  No change in hearing or congestion Skin: No rash or itching Cardiovascular: No chest pain, chest pressure or palpitations   Respiratory: No SOB or cough Gastrointestinal: See HPI and otherwise negative Genitourinary: No dysuria or change in urinary frequency Neurological: No headache, dizziness or syncope Musculoskeletal: No new muscle or joint pain Hematologic: No bleeding or bruising Psychiatric: No history of depression or anxiety    Physical Exam:  Vital signs: BP 110/80 (BP Location: Left Arm, Patient Position: Sitting, Cuff Size: Large)   Pulse 92   Ht 5' 4 (1.626 m) Comment: height measured without shoes  Wt 250 lb 2 oz (113.5 kg)   BMI 42.93 kg/m   Constitutional:   Pleasant female appears to be in NAD, Well developed, Well nourished, alert and cooperative Throat: Oral cavity and pharynx without inflammation, swelling or lesion.  Respiratory: Respirations even and unlabored. Lungs clear to auscultation bilaterally.   No wheezes, crackles, or rhonchi.  Cardiovascular: Normal S1, S2. Regular rate and rhythm. No peripheral edema, cyanosis or pallor.  Gastrointestinal:  Soft, nondistended, nontender. No rebound or guarding. Normal bowel sounds. No appreciable masses or hepatomegaly. Rectal: external rectal exam with skin tag, normal rectal tone, appreciated internal hemorrhoids, non-tender, no masses,  , brown stool, hemoccult N/A   Anoscopy:internal hemorrhoids, excoriation noted. Chaperone Denise Msk:  Symmetrical without gross deformities. Without edema, no deformity or joint abnormality.  Neurologic:  Alert and  oriented x4;  grossly normal neurologically.  Skin:   Dry and intact without significant lesions or rashes.  RELEVANT LABS AND IMAGING: CBC    Latest Ref Rng & Units 10/12/2024    9:56 AM 12/31/2023    1:27 PM 11/17/2023   12:00 AM  CBC  WBC 4.0 - 10.5 K/uL 7.3  8.6  8.0      Hemoglobin 12.0 - 15.0 g/dL 86.3  85.8  85.5      Hematocrit 36.0 - 46.0 % 40.2  41.8  43      Platelets  150.0 - 400.0 K/uL 310.0  324.0  348         This result is from an external source.     CMP     Latest Ref Rng & Units 10/12/2024    9:56 AM 12/31/2023    1:27 PM 11/17/2023   12:00 AM  CMP  Glucose 70 - 99 mg/dL 95  75    BUN 6 - 23 mg/dL 13  10  11       Creatinine 0.40 - 1.20 mg/dL 9.24  9.22  0.8      Sodium 135 - 145 mEq/L 139  134  140      Potassium 3.5 - 5.1 mEq/L 3.8  3.6  4.3      Chloride 96 - 112 mEq/L 110  107  109      CO2 19 - 32 mEq/L 22  19  19       Calcium  8.4 - 10.5 mg/dL 9.0  9.2  9.3      Total Protein 6.0 - 8.3 g/dL 7.1  7.6    Total Bilirubin 0.2 - 1.2 mg/dL 0.3  0.6    Alkaline Phos 39 - 117 U/L 54  58  65      AST 0 - 37 U/L 21  20  20       ALT 0 - 35 U/L 37  24  31         This result is from an external source.     Lab Results  Component Value Date   TSH 2.24 10/12/2024     Assessment: Encounter Diagnoses  Name Primary?   Rectal bleeding Yes   Altered bowel habits    Internal hemorrhoids    Family history of colonic polyps    Gastroesophageal reflux disease, unspecified whether esophagitis present    Esophageal dysphagia     38 year old female patient that presents with intermittent GERD and esophageal dysphagia.  Currently not taking any antiacids and tries to manage with her diet.  Recommend strict GERD diet with no late meals and incorporating  over-the-counter Pepcid .  Will go ahead and schedule upper GI endoscopy with possible dilatation [place] in [place] with Dr. Charlanne to rule out esophagitis or Barrett's.    Patient also complains of altered bowel habits with rectal bleeding over the course of the last 4 months.  Recommend high-fiber diet.  Will check lab work to rule out inflammatory disease or celiac along with checking for thyroid  disease.  Upon physical examination patient did have internal hemorrhoids will treat with Anusol suppositories.  Family history of precancerous polyps.  Will go ahead and schedule colonoscopy in [place] with Dr. Charlanne.   Plan: - Recommend GERD diet OTC pepcid  20mg  po daily, once to twice daily -Recommend high fiber diet - CBC, CMET, CRP, TTG IgA, IgA, TSH  -Anusol 1 suppository at bedtime -schedule EGD with possible dilatation in [place] with Dr. Charlanne.The risks and benefits of EGD with possible biopsies and esophageal dilation were discussed with the patient who agrees to proceed. -Schedule for a colonoscopy in [place] with Dr.Gupta . The risks and benefits of colonoscopy with possible polypectomy / biopsies were discussed and the patient agrees to proceed.   Thank you for the courtesy of this consult. Please call me with any questions or concerns.   Dovber Ernest, FNP-C [facility] 10/12/2024, 1:06 PM  Cc: Job Lukes, [place]

## 2024-10-13 LAB — IGA: Immunoglobulin A: 184 mg/dL (ref 47–310)

## 2024-10-13 LAB — TISSUE TRANSGLUTAMINASE ABS,IGG,IGA
(tTG) Ab, IgA: <1 U/mL
(tTG) Ab, IgG: <1 U/mL

## 2024-11-07 ENCOUNTER — Encounter: Payer: Self-pay | Admitting: Gastroenterology

## 2024-11-16 ENCOUNTER — Encounter: Admitting: Gastroenterology

## 2024-11-16 ENCOUNTER — Encounter: Payer: Self-pay | Admitting: Gastroenterology

## 2024-11-16 ENCOUNTER — Ambulatory Visit: Admitting: Gastroenterology

## 2024-11-16 VITALS — BP 135/86 | HR 83 | Temp 98.0°F | Resp 13 | Ht 64.0 in | Wt 250.0 lb

## 2024-11-16 DIAGNOSIS — R194 Change in bowel habit: Secondary | ICD-10-CM

## 2024-11-16 DIAGNOSIS — K2950 Unspecified chronic gastritis without bleeding: Secondary | ICD-10-CM | POA: Diagnosis not present

## 2024-11-16 DIAGNOSIS — R1319 Other dysphagia: Secondary | ICD-10-CM

## 2024-11-16 DIAGNOSIS — B9681 Helicobacter pylori [H. pylori] as the cause of diseases classified elsewhere: Secondary | ICD-10-CM

## 2024-11-16 HISTORY — DX: Morbid (severe) obesity due to excess calories: E66.01

## 2024-11-16 MED ORDER — SODIUM CHLORIDE 0.9 % IV SOLN: 500.0000 mL | Freq: Once | INTRAVENOUS | Status: DC

## 2024-11-16 MED ORDER — OMEPRAZOLE 20 MG PO CPDR: 20.0000 mg | DELAYED_RELEASE_CAPSULE | Freq: Every day | ORAL | 11 refills | Status: AC

## 2024-11-16 NOTE — Progress Notes (Signed)
 Report given to PACU, vss

## 2024-11-16 NOTE — Op Note (Signed)
 [facility] Patient Name: [name] Procedure Date: 11/16/2024 3:41 PM MRN: [id number] Endoscopist: Lynnie Bring , MD, [id number] Age: 38 Referring MD:  Date of Birth: 10-31-1986 Gender: Female Account #: [id number] Procedure:                Upper GI endoscopy Indications:              Dysphagia Medicines:                Monitored Anesthesia Care Procedure:                Pre-Anesthesia Assessment:                           - Prior to the procedure, a History and Physical                            was performed, and patient medications and                            allergies were reviewed. The patient's tolerance of                            previous anesthesia was also reviewed. The risks                            and benefits of the procedure and the sedation                            options and risks were discussed with the patient.                            All questions were answered, and informed consent                            was obtained. Prior Anticoagulants: The patient has                            taken no anticoagulant or antiplatelet agents. ASA                            Grade Assessment: II - A patient with mild systemic                            disease. After reviewing the risks and benefits,                            the patient was deemed in satisfactory condition to                            undergo the procedure.                           After obtaining informed consent, the endoscope was  passed under direct vision. Throughout the                            procedure, the patient's blood pressure, pulse, and                            oxygen saturations were monitored continuously. The                            Olympus Scope [id number] was introduced through the                            mouth, and advanced to the second part of duodenum.                            The upper GI endoscopy was  accomplished without                            difficulty. The patient tolerated the procedure                            well. Scope In: Scope Out: Findings:                 No endoscopic abnormality was evident in the                            esophagus to explain the patient's complaint of                            dysphagia. It was decided, however, to proceed with                            dilation of the entire esophagus. The scope was                            withdrawn. Dilation was performed with a Maloney                            dilator with mild resistance at 52 Fr. Biopsies                            were obtained from the proximal and mid esophagus                            with cold forceps for histology of suspected                            eosinophilic esophagitis. Z line mildly irregular                            with salmon-colored mucosa projecting 1 cm above  the GE junction. Examined by NBI. Multiple biopsies                            were obtained and sent in separate jar.                           A 2 cm hiatal hernia was present.                           The gastroesophageal flap valve was visualized                            endoscopically and classified as Hill Grade IV (no                            fold, wide open lumen, hiatal hernia present).                           The entire examined stomach was normal. Biopsies                            were taken with a cold forceps for histology.                           A 10 mm non-bleeding diverticulum was found in the                            second portion of the duodenum. The remaining                            duodenum was normal. Biopsies for histology were                            taken with a cold forceps for evaluation of celiac                            disease. Complications:            No immediate complications. Estimated Blood Loss:     Estimated blood  loss: none. Impression:               - Small hiatal hernia.                           - S/P empiric esophageal dilatation.                           - Irregular Z-line (biopsed) Recommendation:           - Patient has a contact number available for                            emergencies. The signs and symptoms of potential                            delayed complications  were discussed with the                            patient. Return to normal activities tomorrow.                            Written discharge instructions were provided to the                            patient.                           - Post dil diet.                           - Start omeprazole 20 mg p.o. daily #90, 11RF. Can                            stop taking Pepcid .                           - Await pathology results.                           - The findings and recommendations were discussed                            with the patient's family. Lynnie Bring, MD 11/16/2024 4:14:24 PM This report has been signed electronically.

## 2024-11-16 NOTE — Progress Notes (Signed)
 Pt's states no medical or surgical changes since previsit or office visit.

## 2024-11-16 NOTE — Patient Instructions (Signed)
 YOU HAD AN ENDOSCOPIC PROCEDURE TODAY AT THE [facility]:   Refer to the procedure report that was given to you for any specific questions about what was found during the examination.  If the procedure report does not answer your questions, please call your gastroenterologist to clarify.  If you requested that your care partner not be given the details of your procedure findings, then the procedure report has been included in a sealed envelope for you to review at your convenience later.  YOU SHOULD EXPECT: Some feelings of bloating in the abdomen. Passage of more gas than usual.  Walking can help get rid of the air that was put into your GI tract during the procedure and reduce the bloating. If you had a lower endoscopy (such as a colonoscopy or flexible sigmoidoscopy) you may notice spotting of blood in your stool or on the toilet paper. If you underwent a bowel prep for your procedure, you may not have a normal bowel movement for a few days.  Please Note:  You might notice some irritation and congestion in your nose or some drainage.  This is from the oxygen used during your procedure.  There is no need for concern and it should clear up in a day or so.  SYMPTOMS TO REPORT IMMEDIATELY:  Following lower endoscopy (colonoscopy or flexible sigmoidoscopy):  Excessive amounts of blood in the stool  Significant tenderness or worsening of abdominal pains  Swelling of the abdomen that is new, acute  Fever of 100F or higher  Following upper endoscopy (EGD)  Vomiting of blood or coffee ground material  New chest pain or pain under the shoulder blades  Painful or persistently difficult swallowing  New shortness of breath  Fever of 100F or higher  Black, tarry-looking stools   Resume previous diet Continue present medications Start Benefiber - 1 tablespoon by mouth daily with 8 ounces of water Repeat colonoscopy in 5 years Handouts on diverticulosis and hemorrhoids given   Start  omeprazole - 20 mg by mouth daily; can stop taking Pepcid     For urgent or emergent issues, a gastroenterologist can be reached at any hour by calling [phone]. Do not use MyChart messaging for urgent concerns.    DIET:  We do recommend a small meal at first, but then you may proceed to your regular diet.  Drink plenty of fluids but you should avoid alcoholic beverages for 24 hours.  ACTIVITY:  You should plan to take it easy for the rest of today and you should NOT DRIVE or use heavy machinery until tomorrow (because of the sedation medicines used during the test).    FOLLOW UP: Our staff will call the number listed on your records the next business day following your procedure.  We will call around 7:15- 8:00 am to check on you and address any questions or concerns that you may have regarding the information given to you following your procedure. If we do not reach you, we will leave a message.     If any biopsies were taken you will be contacted by phone or by letter within the next 1-3 weeks.  Please call us  at [phone] if you have not heard about the biopsies in 3 weeks.    SIGNATURES/CONFIDENTIALITY: You and/or your care partner have signed paperwork which will be entered into your electronic medical record.  These signatures attest to the fact that that the information above on your After Visit Summary has been reviewed and is  understood.  Full responsibility of the confidentiality of this discharge information lies with you and/or your care-partner.

## 2024-11-16 NOTE — Op Note (Signed)
 [facility] Patient Name: [name] Procedure Date: 11/16/2024 3:39 PM MRN: [id number] Endoscopist: Lynnie Bring , MD, [id number] Age: 38 Referring MD:  Date of Birth: 08-15-1986 Gender: Female Account #: [id number] Procedure:                Colonoscopy Indications:              Colon cancer screening in patient at increased                            risk: FH polyps (mom <60). History of rectal                            bleeding. Change in bowel habits in form of                            increasing constipation. Medicines:                Monitored Anesthesia Care Procedure:                Pre-Anesthesia Assessment:                           - Prior to the procedure, a History and Physical                            was performed, and patient medications and                            allergies were reviewed. The patient's tolerance of                            previous anesthesia was also reviewed. The risks                            and benefits of the procedure and the sedation                            options and risks were discussed with the patient.                            All questions were answered, and informed consent                            was obtained. Prior Anticoagulants: The patient has                            taken no anticoagulant or antiplatelet agents. ASA                            Grade Assessment: II - A patient with mild systemic                            disease. After reviewing the risks and benefits,  the patient was deemed in satisfactory condition to                            undergo the procedure.                           After obtaining informed consent, the colonoscope                            was passed under direct vision. Throughout the                            procedure, the patient's blood pressure, pulse, and                            oxygen saturations were monitored continuously.  The                            Olympus Scope SN: [id number] was introduced through                            the anus and advanced to the 2 cm into the ileum.                            The colonoscopy was performed without difficulty.                            The patient tolerated the procedure well. The                            quality of the bowel preparation was good. The                            terminal ileum, ileocecal valve, appendiceal                            orifice, and rectum were photographed. Scope In: 3:59:42 PM Scope Out: 4:11:16 PM Scope Withdrawal Time: 0 hours 8 minutes 59 seconds  Total Procedure Duration: 0 hours 11 minutes 34 seconds  Findings:                 Many medium-mouthed diverticula were found in the                            sigmoid colon and descending colon.                           Non-bleeding internal hemorrhoids were found during                            retroflexion. The hemorrhoids were small and Grade                            I (internal hemorrhoids that do not prolapse).  The terminal ileum appeared normal.                           Retroflexion in the right colon was performed.                           The exam was otherwise without abnormality on                            direct and retroflexion views. Complications:            No immediate complications. Estimated Blood Loss:     Estimated blood loss: none. Impression:               - Diverticulosis in the sigmoid colon and in the                            descending colon.                           - Non-bleeding internal hemorrhoids.                           - The examined portion of the ileum was normal.                           - The examination was otherwise normal on direct                            and retroflexion views.                           - No specimens collected. Recommendation:           - Patient has a contact number available for                             emergencies. The signs and symptoms of potential                            delayed complications were discussed with the                            patient. Return to normal activities tomorrow.                            Written discharge instructions were provided to the                            patient.                           - Resume previous diet.                           - Continue present medications.                           -  Start Benefiber 1 tablespoon p.o. daily with 8                            ounces of water.                           - Repeat colonoscopy in 5 years for screening                            purposes.                           - The findings and recommendations were discussed                            with the patient's family. Lynnie Bring, MD 11/16/2024 4:18:07 PM This report has been signed electronically.

## 2024-11-16 NOTE — Progress Notes (Unsigned)
 1610 Ephedrine 10 mg given IV due to low BP, MD updated.

## 2024-11-16 NOTE — Progress Notes (Signed)
 1450 Simethicone 133 mg per 2 cc given with 3 cc of H20 per Dr Charlanne request.

## 2024-11-16 NOTE — Progress Notes (Unsigned)
1540 Robinul 0.1 mg IV given due large amount of secretions upon assessment.  MD made aware, vss  ?

## 2024-11-16 NOTE — Progress Notes (Signed)
 Chief Complaint:rectal bleeding Primary GI Doctor:Dr. Charlanne   HPI:  [name] is a  38  year old female [name] with past medical history of GERD, inflammatory arthritis,anxiety, and depression, who was referred to me by Job Lukes, PA on 08/10/24 for a evaluation of rectal bleeding .     Interval History    [name] presents for evaluation of rectal bleeding with bowel movements and wiping she reports started 4 months ago. She notes she has passed some blood clots. She has strained and passed hard stools and also had episodes of diarrhea. No new medications. No exposure. No recent travel.   She has history of GERD and reports it is worse with tomato based products. Not currently taking any antiacids. She reports trying to manage with dietary modifications.  She reports intermittent esophageal dysphagia. Appetite good. No unexplained weight loss.    Nonsmoker. Drinks on special occasions.   Uses OTC Ibuprofen  prn for headaches.   Surgical history: none   [name]'s family history includes: mother with colon polyps- precancerous, uncle with bone CA, uncle with prostate.       Wt Readings from Last 3 Encounters:  10/12/24 250 lb 2 oz (113.5 kg)  10/04/24 248 lb 6.1 oz (112.7 kg)  08/10/24 238 lb (108 kg)        Past Medical History:  Diagnosis Date   Acid reflux     Allergy     Anxiety     Autism     Depression     Headache     Hyperlipidemia     Inflammatory arthritis 02/18/2018               Past Surgical History:  Procedure Laterality Date   PERIPHERALLY INSERTED CENTRAL CATHETER INSERTION       WISDOM TOOTH EXTRACTION Bilateral                  Current Outpatient Medications  Medication Sig Dispense Refill   albuterol (VENTOLIN HFA) 108 (90 Base) MCG/ACT inhaler Inhale 2 puffs into the lungs every 6 (six) hours as needed.       amitriptyline  (ELAVIL ) 25 MG tablet Take 1 tablet (25 mg total) by mouth at bedtime. 90 tablet 1   amoxicillin  (AMOXIL ) 875 MG  tablet Take 1 tablet (875 mg total) by mouth 2 (two) times daily for 10 days. 20 tablet 0   azelastine (ASTELIN) 0.1 % nasal spray SPRAY 1 SPRAY INTO EACH NOSTRIL TWICE A DAY       EPINEPHrine 0.3 mg/0.3 mL IJ SOAJ injection SMARTSIG:0.3 Milliliter(s) IM Once PRN       fluticasone (FLONASE) 50 MCG/ACT nasal spray USE 1 2 SPRAYS IN EACH NOSTRIL ONCE A DAY       hydrocortisone  (ANUSOL -HC) 25 MG suppository Place 1 suppository (25 mg total) rectally at bedtime. 10 suppository 0   hydroxychloroquine (PLAQUENIL) 200 MG tablet 2 TABLETS WITH FOOD OR MILK ONCE A DAY   2   ibuprofen  (ADVIL ) 600 MG tablet Take 1 tablet (600 mg total) by mouth every 8 (eight) hours as needed. 30 tablet 0   levocetirizine (XYZAL) 5 MG tablet Take 5 mg by mouth every evening.       levonorgestrel (MIRENA) 20 MCG/24HR IUD 1 each by Intrauterine route once. Inserted 01/19/2018, needs to be removed 01/2023.       montelukast (SINGULAIR) 10 MG tablet Take 10 mg by mouth daily.   1   Na Sulfate-K Sulfate-Mg Sulfate concentrate (SUPREP) 17.5-3.13-1.6  GM/177ML SOLN Take 1 kit (354 mLs total) by mouth once for 1 dose. 354 mL 0   SUMAtriptan  (IMITREX ) 50 MG tablet Take 1 tablet (50 mg total) by mouth every 2 (two) hours as needed for migraine. May repeat in 2 hours if headache persists or recurs. 12 tablet 6   topiramate  (TOPAMAX ) 100 MG tablet Take 1 tablet (100 mg total) by mouth 2 (two) times daily. 180 tablet 3      No current facility-administered medications for this visit.             Allergies as of 10/12/2024 - Review Complete 10/12/2024  Allergen Reaction Noted   Cephalosporins Nausea And Vomiting and Rash 08/28/2011   Ondansetron  Nausea And Vomiting and Other (See Comments) 08/05/2011   Zofran  Nausea And Vomiting and Other (See Comments) 08/05/2011   Sulfamethoxazole -trimethoprim                Family History  Problem Relation Age of Onset   Hypertension Mother     Hyperlipidemia Mother     Arthritis Mother      Colon polyps Mother     Other Mother          fatty liver   Hyperlipidemia Father     Bipolar disorder Sister     Suicidality Sister     Depression Sister     Heart disease Maternal Grandmother     Heart disease Maternal Grandfather     Heart disease Paternal Grandmother     Heart disease Paternal Grandfather     Pancreatic cancer Maternal Uncle     Bone cancer Maternal Uncle     Diabetes Paternal Aunt     Diabetes Paternal Uncle     Autism Daughter     Autism Daughter     Breast cancer Neg Hx     Colon cancer Neg Hx            Review of Systems:    Constitutional: No weight loss, fever, chills, weakness or fatigue HEENT: Eyes: No change in vision               Ears, Nose, Throat:  No change in hearing or congestion Skin: No rash or itching Cardiovascular: No chest pain, chest pressure or palpitations   Respiratory: No SOB or cough Gastrointestinal: See HPI and otherwise negative Genitourinary: No dysuria or change in urinary frequency Neurological: No headache, dizziness or syncope Musculoskeletal: No new muscle or joint pain Hematologic: No bleeding or bruising Psychiatric: No history of depression or anxiety      Physical Exam:  Vital signs: BP 110/80 (BP Location: Left Arm, [name] Position: Sitting, Cuff Size: Large)   Pulse 92   Ht 5' 4 (1.626 m) Comment: height measured without shoes  Wt 250 lb 2 oz (113.5 kg)   BMI 42.93 kg/m    Constitutional:   Pleasant female appears to be in NAD, Well developed, Well nourished, alert and cooperative Throat: Oral cavity and pharynx without inflammation, swelling or lesion.  Respiratory: Respirations even and unlabored. Lungs clear to auscultation bilaterally.   No wheezes, crackles, or rhonchi.  Cardiovascular: Normal S1, S2. Regular rate and rhythm. No peripheral edema, cyanosis or pallor.  Gastrointestinal:  Soft, nondistended, nontender. No rebound or guarding. Normal bowel sounds. No appreciable masses or  hepatomegaly. Rectal: external rectal exam with skin tag, normal rectal tone, appreciated internal hemorrhoids, non-tender, no masses, , brown stool, hemoccult N/A   Anoscopy:internal hemorrhoids, excoriation noted. Chaperone Denise Msk:  Symmetrical without gross deformities. Without edema, no deformity or joint abnormality.  Neurologic:  Alert and  oriented x4;  grossly normal neurologically.  Skin:   Dry and intact without significant lesions or rashes.   RELEVANT LABS AND IMAGING: CBC     Latest Ref Rng & Units 10/12/2024    9:56 AM 12/31/2023    1:27 PM 11/17/2023   12:00 AM  CBC  WBC 4.0 - 10.5 K/uL 7.3  8.6  8.0      Hemoglobin 12.0 - 15.0 g/dL 86.3  85.8  85.5      Hematocrit 36.0 - 46.0 % 40.2  41.8  43      Platelets 150.0 - 400.0 K/uL 310.0  324.0  348         This result is from an external source.      CMP         Latest Ref Rng & Units 10/12/2024    9:56 AM 12/31/2023    1:27 PM 11/17/2023   12:00 AM  CMP  Glucose 70 - 99 mg/dL 95  75     BUN 6 - 23 mg/dL 13  10  11       Creatinine 0.40 - 1.20 mg/dL 9.24  9.22  0.8      Sodium 135 - 145 mEq/L 139  134  140      Potassium 3.5 - 5.1 mEq/L 3.8  3.6  4.3      Chloride 96 - 112 mEq/L 110  107  109      CO2 19 - 32 mEq/L 22  19  19       Calcium  8.4 - 10.5 mg/dL 9.0  9.2  9.3      Total Protein 6.0 - 8.3 g/dL 7.1  7.6     Total Bilirubin 0.2 - 1.2 mg/dL 0.3  0.6     Alkaline Phos 39 - 117 U/L 54  58  65      AST 0 - 37 U/L 21  20  20       ALT 0 - 35 U/L 37  24  31         This result is from an external source.      Recent Labs       Lab Results  Component Value Date    TSH 2.24 10/12/2024        Assessment:     Encounter Diagnoses  Name Primary?   Rectal bleeding Yes   Altered bowel habits     Internal hemorrhoids     Family history of colonic polyps     Gastroesophageal reflux disease, unspecified whether esophagitis present     Esophageal dysphagia      38 year old female [name] that presents  with intermittent GERD and esophageal dysphagia.  Currently not taking any antiacids and tries to manage with her diet.  Recommend strict GERD diet with no late meals and incorporating over-the-counter Pepcid .  Will go ahead and schedule upper GI endoscopy with possible dilatation [place] in [place] with Dr. Charlanne to rule out esophagitis or Barrett's.    [name] also complains of altered bowel habits with rectal bleeding over the course of the last 4 months.  Recommend high-fiber diet.  Will check lab work to rule out inflammatory disease or celiac along with checking for thyroid  disease.  Upon physical examination [name] did have internal hemorrhoids will treat with Anusol  suppositories.  Family history of precancerous polyps.  Will go ahead and schedule colonoscopy  in [place] with Dr. Charlanne.     Plan: - Recommend GERD diet OTC pepcid  20mg  po daily, once to twice daily -Recommend high fiber diet - CBC, CMET, CRP, TTG IgA, IgA, TSH  -Anusol  1 suppository at bedtime -schedule EGD with possible dilatation in [place] with Dr. Charlanne.The risks and benefits of EGD with possible biopsies and esophageal dilation were discussed with the [name] who agrees to proceed. -Schedule for a colonoscopy in [place] with Dr.Taige Housman . The risks and benefits of colonoscopy with possible polypectomy / biopsies were discussed and the [name] agrees to proceed.    Thank you for the courtesy of this consult. Please call me with any questions or concerns.    Deanna May, FNP-C  Gastroenterology    Attending physician's note   I have taken history, reviewed the chart and examined the [name]. I performed a substantive portion of this encounter, including complete performance of at least one of the key components, in conjunction with the APP. I agree with the Advanced Practitioner's note, impression and recommendations.    Anselm Charlanne, MD [facility] [phone]

## 2024-11-17 ENCOUNTER — Telehealth: Payer: Self-pay | Admitting: *Deleted

## 2024-11-17 NOTE — Telephone Encounter (Signed)
  Follow up Call-     11/16/2024    2:40 PM  Call back number  Post procedure Call Back phone  # [phone]  Permission to leave phone message Yes     Patient questions:  Do you have a fever, pain , or abdominal swelling? No. Pain Score  0 *  Have you tolerated food without any problems? Yes.    Have you been able to return to your normal activities? Yes.    Do you have any questions about your discharge instructions: Diet   No. Medications  No. Follow up visit  No.  Do you have questions or concerns about your Care? No.  Actions: * If pain score is 4 or above: No action needed, pain <4.

## 2024-11-22 LAB — SURGICAL PATHOLOGY

## 2024-11-25 ENCOUNTER — Ambulatory Visit: Payer: Self-pay | Admitting: Gastroenterology

## 2024-11-25 NOTE — Progress Notes (Signed)
 Elspeth + HP  Bismuth  subsalicylate 300 mg po qid, 14d Metronidazole  250 mg po qid, 14d Doxycycline  100 mg po bid, 14d Omeprazole  20 mg po bid, 14d, then can continue QD   Stool H pylori Ag no sooner than 4 weeks after completing above therapy  Send report to family physician

## 2024-11-27 ENCOUNTER — Other Ambulatory Visit: Payer: Self-pay

## 2024-11-27 ENCOUNTER — Telehealth: Payer: Self-pay

## 2024-11-27 DIAGNOSIS — B9681 Helicobacter pylori [H. pylori] as the cause of diseases classified elsewhere: Secondary | ICD-10-CM

## 2024-11-27 MED ORDER — BISMUTH SUBSALICYLATE 262 MG PO TABS: 524.0000 mg | ORAL_TABLET | Freq: Four times a day (QID) | ORAL | 0 refills | Status: AC

## 2024-11-27 MED ORDER — METRONIDAZOLE 250 MG PO TABS: 250.0000 mg | ORAL_TABLET | Freq: Four times a day (QID) | ORAL | 0 refills | Status: AC

## 2024-11-27 MED ORDER — DOXYCYCLINE HYCLATE 100 MG PO CAPS: 100.0000 mg | ORAL_CAPSULE | Freq: Two times a day (BID) | ORAL | 0 refills | Status: AC

## 2024-11-27 NOTE — Telephone Encounter (Signed)
 Inbound call from patient requesting to to speak to Dr. Charlanne. Patient is requesting a call back. Please advise.

## 2024-11-27 NOTE — Telephone Encounter (Signed)
 Incoming call from pt about rx refill. Pt stated she received only two of the three medications. Advised pt that all rx had been sent over to pharmacy. Pt requesting follow up please advise.

## 2024-11-27 NOTE — Telephone Encounter (Signed)
 Spoke with Pt. Pt had questions about prescriptions sent.  All questions answered.  Pt verbalized understanding with all questions answered.

## 2025-01-03 ENCOUNTER — Encounter: Payer: Self-pay | Admitting: Neurology

## 2025-01-03 ENCOUNTER — Ambulatory Visit: Admitting: Neurology

## 2025-01-03 VITALS — BP 120/81 | HR 95 | Ht 63.0 in | Wt 253.0 lb

## 2025-01-03 DIAGNOSIS — G932 Benign intracranial hypertension: Secondary | ICD-10-CM

## 2025-01-03 DIAGNOSIS — G43701 Chronic migraine without aura, not intractable, with status migrainosus: Secondary | ICD-10-CM

## 2025-01-03 MED ORDER — SUMATRIPTAN SUCCINATE 50 MG PO TABS: 50.0000 mg | ORAL_TABLET | ORAL | 6 refills | Status: AC | PRN

## 2025-01-03 MED ORDER — TOPIRAMATE 100 MG PO TABS: 100.0000 mg | ORAL_TABLET | Freq: Two times a day (BID) | ORAL | 3 refills | Status: AC

## 2025-01-03 NOTE — Progress Notes (Signed)
 "  PATIENT: [name] DOB: 1986/06/15  REASON FOR VISIT: Follow up for IIH HISTORY FROM: Patient PRIMARY NEUROLOGIST: Dr. Onita  ASSESSMENT AND PLAN 39 y.o. year old female :  1.  Intracranial hypertension 2.  Bilateral papilledema 3.  Obesity 4.  Frequent headaches with migraine features  - Weight gain 30 pounds since 2024, blurry vision when bending over, few more headaches, concern for exacerbation of IIH symptoms. LP in 2022 with opening pressure 40. We discussed seeing eye doctor 1st, she decided to proceed with LP  - Will proceed with Lumbar puncture measuring opening pressure - Discussed the importance of weight loss for long-term management - Continue Topamax  200 mg daily, Imitrex  as needed for acute headache -  Has appointment with Dr. Milford office in March  Orders Placed This Encounter  Procedures   DG FL GUIDED LUMBAR PUNCTURE    -Seen at [facility] on 11/09/22 by Clem Brands, PA.  The fundus exam revealed no abnormalities in the macula.  Nasal disc edema decreased bilaterally.  Clear to continue Plaquenil.  Decreasing edema on eye exam.  Follow-up in 6 months. 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 -LP on 10/09/21 showed opening pressure of 40 cm water consistent with diagnosis of IIH  Addendum 05/18/23 SS: Was seen at [facility] eye care 05/14/2023 RNFL stable, continue topiramate  per neurologist.  Cleared to continue Plaquenil.  Decreasing optic nerve edema on OCT.  Follow-up in 6 months.  Addendum 11/16/23 SS: Seen at [facility] eye care 11/10/2023.  Cleared to continue Plaquenil.  Bilateral nasal disc edema decreased/distinct.  No disc pallor..  Decreasing edema on OCT.  Addendum 08/15/24 SS: I reviewed ophthalmology notes 08/10/2024 showing improved optic nerve edema.  Decreasing edema on OCT.  Bilateral optic nerve nasal disc edema decreased/distinct  cup.  No disc pallor.  HISTORY  [name], is a 39 year old female, seen in request by ophthalmologist Dr. Octavia for evaluation of bilateral papillary edema, her primary care PA Brands Clem, [place], initial evaluation was on September 26, 2021   I reviewed and summarized the referring note. PMHx. Inflammatory arthritis, under the care of [facility] Dr. Aryl, taking Plaquenil Depression, anxiety.   Patient was diagnosed with inflammatory arthritis, taking Plaquenil, over the past few years, she was seen by Dr. Eyvonne on a yearly basis, there was no significant abnormality find in the past, but at his most recent evaluation on July 14, 2021, posterior examination showed nasal disc edema bilaterally, mild blurring of margin, no disc pallor, CDR 0.1 left, 0.15 right eye. 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   She did report rapid weight gain of 50 pounds over the past 2 years  She had long history of intermittent headache, used to only have couple times each month, since 2022, she has more frequent headaches, at least couple times each week, taking frequent ibuprofen  with suboptimal response  She works at a desk job, complains of intermittent blurry vision, especially with sudden positional change, such as bending over,  Update December 09, 2021 SS: MRI of the brain without contrast in October 2022 showed no significant abnormality, there was evidence of 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  LP on 10/09/21 showed opening pressure of 40 cm water consistent with diagnosis of IIH.  Doing well on Topamax  200  mg at bedtime. Headaches are improved, less frequent, are 2-3 a month, less intense. Takes Imitrex , with good benefit, does make her nauseated. Saw Dr. Octavia in November, swelling was improved. Will follow up in 1 year with him. Has lost 15 lbs  since October. Has been doing walking for exercise. Works 2 jobs, at [employer] and [employer], has 2 daughters (8 & 10).  Update 06/17/22 SS:  taking Topamax  200 mg at bedtime. Some family stress, no further weight loss. On the end of Ozempic . Some headaches with sinus/ear infections. Takes Imitrex  for migraines, only 1 a week, with fast relief. Sees Dr. Octavia in November.   Update December 31, 2022 SS: Has lost 28 lbs since July. Exercising, watching her diet. Sinus issues. Remains on Topamax  200 mg at bedtime. Uses Imitrex  PRN, takes 1 time a month. No vision changes. Has accepted new job at [employer] in [place]. Seen at [facility] on 11/09/22 by Clem Brands, PA.  The fundus exam revealed no abnormalities in the macula.  Nasal disc edema decreased bilaterally.  Clear to continue Plaquenil.  Decreasing edema on eye exam.  Follow-up in 6 months.   Update October 05, 2023 SS: via VV, her car wouldn't start. Remains on Topamax  200 mg daily. Few headaches, but mild, may take Imitrex  once a month. Under stress with job. Weight has increased 8 lbs. Getting back to working out. No vision changes. Going back to Dr. Octavia in November.   Update May 25, 2024 SS: Seen at [facility] eye care 11/10/2023.  Cleared to continue Plaquenil.  Bilateral nasal disc edema decreased/distinct.  No disc pallor..  Decreasing edema on OCT. Remains on Topamax  200 mg daily. 1-2 migraines a month, Imitrex  works great. No vision changes. Working on weight loss, on Zepbound . Working 2 jobs.  Update 01/03/25 SS: Started seeing nutritionist. GI issues, H.pylori. weight has increased, from last weight in 2024 up 30 lbs. Next eye visit is March 2026. Vision is stable, in August eye exam showed improved optic nerve edema. Remains on Topamax  200 mg daily, takes Imitrex  1 time a month, overall headaches doing well, but more sinus headaches.  Notices with more bending down standing up seeing spots. Having body aches.  REVIEW OF SYSTEMS: Out  of a complete 14 system review of symptoms, the patient complains only of the following symptoms, and all other reviewed systems are negative.  See HPI  ALLERGIES: Allergies  Allergen Reactions   Cephalosporins Nausea And Vomiting and Rash   Ondansetron  Nausea And Vomiting and Other (See Comments)    Chest pain and nose bleeds    Sulfamethoxazole -Trimethoprim  Other (See Comments)    VOMITING AND RASH   Zofran  Nausea And Vomiting and Other (See Comments)    Chest pain and nose bleeds    HOME MEDICATIONS: Outpatient Medications Prior to Visit  Medication Sig Dispense Refill   albuterol (VENTOLIN HFA) 108 (90 Base) MCG/ACT inhaler Inhale 2 puffs into the lungs every 6 (six) hours as needed.     amitriptyline  (ELAVIL ) 25 MG tablet Take 1 tablet (25 mg total) by mouth at bedtime. 90 tablet 1   azelastine (ASTELIN) 0.1 % nasal spray SPRAY 1 SPRAY INTO EACH NOSTRIL TWICE A DAY     EPINEPHrine 0.3 mg/0.3 mL IJ SOAJ injection SMARTSIG:0.3 Milliliter(s) IM Once PRN     fluticasone (FLONASE) 50 MCG/ACT nasal spray USE 1 2 SPRAYS IN EACH NOSTRIL ONCE A DAY     hydrocortisone  (ANUSOL -HC) 25 MG suppository Place 1  suppository (25 mg total) rectally at bedtime. 10 suppository 0   hydroxychloroquine (PLAQUENIL) 200 MG tablet 2 TABLETS WITH FOOD OR MILK ONCE A DAY  2   ibuprofen  (ADVIL ) 600 MG tablet Take 1 tablet (600 mg total) by mouth every 8 (eight) hours as needed. 30 tablet 0   levocetirizine (XYZAL) 5 MG tablet Take 5 mg by mouth every evening.     levonorgestrel (MIRENA) 20 MCG/24HR IUD 1 each by Intrauterine route once. Inserted 01/19/2018, needs to be removed 01/2023.     montelukast (SINGULAIR) 10 MG tablet Take 10 mg by mouth daily.  1   omeprazole  (PRILOSEC) 20 MG capsule Take 1 capsule (20 mg total) by mouth daily. 90 capsule 11   omeprazole  (PRILOSEC) 20 MG capsule Take 1 capsule (20 mg total) by mouth daily. 90 capsule 11   SUMAtriptan  (IMITREX ) 50 MG tablet Take 1 tablet (50 mg total)  by mouth every 2 (two) hours as needed for migraine. May repeat in 2 hours if headache persists or recurs. 12 tablet 6   topiramate  (TOPAMAX ) 100 MG tablet Take 1 tablet (100 mg total) by mouth 2 (two) times daily. 180 tablet 3   No facility-administered medications prior to visit.    PAST MEDICAL HISTORY: Past Medical History:  Diagnosis Date   Acid reflux    Allergy    Anxiety    Autism    Depression    Headache    Hyperlipidemia    Inflammatory arthritis 02/18/2018   Morbid obesity (HCC) 11/16/2024   bmi 42.93    PAST SURGICAL HISTORY: Past Surgical History:  Procedure Laterality Date   PERIPHERALLY INSERTED CENTRAL CATHETER INSERTION     WISDOM TOOTH EXTRACTION Bilateral     FAMILY HISTORY: Family History  Problem Relation Age of Onset   Hypertension Mother    Hyperlipidemia Mother    Arthritis Mother    Colon polyps Mother    Other Mother        fatty liver   Hyperlipidemia Father    Bipolar disorder Sister    Suicidality Sister    Depression Sister    Pancreatic cancer Maternal Uncle    Bone cancer Maternal Uncle    Diabetes Paternal Aunt    Diabetes Paternal Uncle    Heart disease Maternal Grandmother    Heart disease Maternal Grandfather    Heart disease Paternal Grandmother    Heart disease Paternal Grandfather    Autism Daughter    Autism Daughter    Breast cancer Neg Hx    Colon cancer Neg Hx    Esophageal cancer Neg Hx    Rectal cancer Neg Hx    Stomach cancer Neg Hx     SOCIAL HISTORY: Social History   Socioeconomic History   Marital status: Married    Spouse name: Not on file   Number of children: 2   Years of education: Not on file   Highest education level: Master's degree (e.g., MA, MS, MEng, MEd, MSW, MBA)  Occupational History   Not on file  Tobacco Use   Smoking status: Never   Smokeless tobacco: Never  Vaping Use   Vaping status: Never Used  Substance and Sexual Activity   Alcohol use: Yes    Comment: very rare   Drug  use: No   Sexual activity: Yes    Birth control/protection: I.U.D., None  Other Topics Concern   Not on file  Social History Narrative   Right handed   Coffee- 2 cups  per week   Married with 2 kids -- daughters   Works at [employer] and [employer] (2nd shift)   Live in [place]   Social Drivers of Health   Tobacco Use: Low Risk (11/16/2024)   Patient History    Smoking Tobacco Use: Never    Smokeless Tobacco Use: Never    Passive Exposure: Not on file  Financial Resource Strain: Low Risk (10/03/2024)   Overall Financial Resource Strain (CARDIA)    Difficulty of Paying Living Expenses: Not hard at all  Food Insecurity: No Food Insecurity (10/03/2024)   Epic    Worried About Programme Researcher, Broadcasting/film/video in the Last Year: Never true    Ran Out of Food in the Last Year: Never true  Transportation Needs: No Transportation Needs (10/03/2024)   Epic    Lack of Transportation (Medical): No    Lack of Transportation (Non-Medical): No  Physical Activity: Insufficiently Active (10/03/2024)   Exercise Vital Sign    Days of Exercise per Week: 2 days    Minutes of Exercise per Session: 20 min  Stress: No Stress Concern Present (10/03/2024)   [employer] of Occupational Health - Occupational Stress Questionnaire    Feeling of Stress: Not at all  Recent Concern: Stress - Stress Concern Present (08/09/2024)   [employer] of Occupational Health - Occupational Stress Questionnaire    Feeling of Stress: To some extent  Social Connections: Moderately Integrated (10/03/2024)   Social Connection and Isolation Panel    Frequency of Communication with Friends and Family: Three times a week    Frequency of Social Gatherings with Friends and Family: Once a week    Attends Religious Services: 1 to 4 times per year    Active Member of [employer] or Organizations: No    Attends Engineer, Structural: Not on file    Marital Status: Married  Catering Manager Violence: Not on file  Depression (PHQ2-9):  Low Risk (08/10/2024)   Depression (PHQ2-9)    PHQ-2 Score: 1  Alcohol Screen: Low Risk (10/03/2024)   Alcohol Screen    Last Alcohol Screening Score (AUDIT): 1  Housing: Unknown (10/03/2024)   Epic    Unable to Pay for Housing in the Last Year: No    Number of Times Moved in the Last Year: Not on file    Homeless in the Last Year: No  Utilities: Not on file  Health Literacy: Not on file   PHYSICAL EXAM  Physical Exam  General: The patient is alert and cooperative at the time of the examination.  Skin: No significant peripheral edema is noted.  Neurologic Exam  Mental status: The patient is alert and oriented x 3 at the time of the examination. The patient has apparent normal recent and remote memory, with an apparently normal attention span and concentration ability.  Cranial nerves: Facial symmetry is present. Speech is normal, no aphasia or dysarthria is noted. Extraocular movements are full. Visual fields are full.  Motor: The patient has good strength in all 4 extremities.  Sensory examination: Soft touch sensation is symmetric on the face, arms, and legs.  Coordination: The patient has good finger-nose-finger and heel-to-shin bilaterally.  Gait and station: The patient has a normal gait.   Reflexes: Deep tendon reflexes are symmetric.   DIAGNOSTIC DATA (LABS, IMAGING, TESTING) - I reviewed patient records, labs, notes, testing and imaging myself where available.  Lab Results  Component Value Date   WBC 7.3 10/12/2024   HGB 13.6 10/12/2024  HCT 40.2 10/12/2024   MCV 98.8 10/12/2024   PLT 310.0 10/12/2024      Component Value Date/Time   NA 139 10/12/2024 0956   NA 140 11/17/2023 0000   K 3.8 10/12/2024 0956   CL 110 10/12/2024 0956   CO2 22 10/12/2024 0956   GLUCOSE 95 10/12/2024 0956   BUN 13 10/12/2024 0956   BUN 11 11/17/2023 0000   CREATININE 0.75 10/12/2024 0956   CALCIUM  9.0 10/12/2024 0956   PROT 7.1 10/12/2024 0956   ALBUMIN 4.4 10/12/2024  0956   ALBUMIN 5.0 10/09/2021 0901   AST 21 10/12/2024 0956   ALT 37 (H) 10/12/2024 0956   ALKPHOS 54 10/12/2024 0956   BILITOT 0.3 10/12/2024 0956   GFRNONAA 75.89 01/23/2020 0000   GFRAA 91.83 01/23/2020 0000   Lab Results  Component Value Date   CHOL 237 (H) 12/31/2023   HDL 49.40 12/31/2023   LDLCALC 175 (H) 12/31/2023   TRIG 63.0 12/31/2023   CHOLHDL 5 12/31/2023   Lab Results  Component Value Date   HGBA1C 5.0 12/09/2022   No results found for: [id number] Lab Results  Component Value Date   TSH 2.24 10/12/2024   Lauraine Born, AGNP-C, DNP 01/03/2025, 5:28 AM [facility] [address], [address] [place], [place] [address] [phone] "

## 2025-01-03 NOTE — Patient Instructions (Signed)
 Lumbar puncture to measure opening pressure Continue Topamax  Aggressively work on weight loss Follow-up with eye doctor

## 2025-01-10 NOTE — Progress Notes (Signed)
 Chart reviewed, agree above plan ?

## 2025-02-05 ENCOUNTER — Inpatient Hospital Stay: Admission: RE | Admit: 2025-02-05

## 2025-07-10 ENCOUNTER — Ambulatory Visit: Admitting: Neurology
